# Patient Record
Sex: Female | Born: 1980
Health system: Southern US, Community
[De-identification: ages and names within clinical notes are randomized; demographics above are authoritative.]

## PROBLEM LIST (undated history)

## (undated) DIAGNOSIS — D649 Anemia, unspecified: Secondary | ICD-10-CM

## (undated) DIAGNOSIS — F419 Anxiety disorder, unspecified: Secondary | ICD-10-CM

## (undated) DIAGNOSIS — I10 Essential (primary) hypertension: Secondary | ICD-10-CM

## (undated) DIAGNOSIS — Z9889 Other specified postprocedural states: Secondary | ICD-10-CM

## (undated) DIAGNOSIS — R609 Edema, unspecified: Secondary | ICD-10-CM

## (undated) DIAGNOSIS — R519 Headache, unspecified: Secondary | ICD-10-CM

## (undated) DIAGNOSIS — R112 Nausea with vomiting, unspecified: Secondary | ICD-10-CM

## (undated) DIAGNOSIS — C801 Malignant (primary) neoplasm, unspecified: Secondary | ICD-10-CM

## (undated) HISTORY — PX: OTHER SURGICAL HISTORY: SHX169

## (undated) HISTORY — PX: WISDOM TOOTH EXTRACTION: SHX21

## (undated) HISTORY — PX: DILATION AND CURETTAGE OF UTERUS: SHX78

## (undated) HISTORY — DX: Edema, unspecified: R60.9

## (undated) HISTORY — PX: UPPER GASTROINTESTINAL ENDOSCOPY: SHX188

## (undated) HISTORY — DX: Anxiety disorder, unspecified: F41.9

---

## 2006-03-16 ENCOUNTER — Ambulatory Visit: Payer: Self-pay | Admitting: Unknown Physician Specialty

## 2007-01-18 ENCOUNTER — Ambulatory Visit: Payer: Self-pay

## 2007-03-15 ENCOUNTER — Observation Stay: Payer: Self-pay | Admitting: Unknown Physician Specialty

## 2007-03-15 ENCOUNTER — Other Ambulatory Visit: Payer: Self-pay

## 2007-04-06 ENCOUNTER — Ambulatory Visit: Payer: Self-pay | Admitting: Unknown Physician Specialty

## 2007-04-07 ENCOUNTER — Inpatient Hospital Stay: Payer: Self-pay | Admitting: Unknown Physician Specialty

## 2009-04-19 ENCOUNTER — Ambulatory Visit: Payer: Self-pay | Admitting: Internal Medicine

## 2012-03-10 ENCOUNTER — Ambulatory Visit: Payer: Self-pay | Admitting: Family Medicine

## 2012-10-15 ENCOUNTER — Ambulatory Visit: Payer: Self-pay

## 2012-10-15 LAB — RAPID INFLUENZA A&B ANTIGENS

## 2013-11-15 ENCOUNTER — Ambulatory Visit: Payer: Self-pay | Admitting: Specialist

## 2014-01-19 ENCOUNTER — Ambulatory Visit: Payer: Self-pay | Admitting: Physician Assistant

## 2014-02-24 HISTORY — PX: BARIATRIC SURGERY: SHX1103

## 2014-02-24 HISTORY — PX: CHOLECYSTECTOMY: SHX55

## 2014-03-05 ENCOUNTER — Ambulatory Visit: Payer: Self-pay | Admitting: Specialist

## 2014-03-13 ENCOUNTER — Inpatient Hospital Stay: Payer: Self-pay | Admitting: Specialist

## 2014-03-13 LAB — CREATININE, SERUM
Creatinine: 0.88 mg/dL (ref 0.60–1.30)
EGFR (Non-African Amer.): >60

## 2014-03-14 LAB — MAGNESIUM: Magnesium: 1.6 mg/dL — ABNORMAL LOW

## 2014-03-14 LAB — CBC WITH DIFFERENTIAL/PLATELET
BASOS ABS: 0 10*3/uL (ref 0.0–0.1)
BASOS PCT: 0.2 %
Eosinophil #: 0 10*3/uL (ref 0.0–0.7)
Eosinophil %: 0.1 %
HCT: 36.4 % (ref 35.0–47.0)
HGB: 12.9 g/dL (ref 12.0–16.0)
LYMPHS ABS: 1 10*3/uL (ref 1.0–3.6)
Lymphocyte %: 11.5 %
MCH: 33.9 pg (ref 26.0–34.0)
MCHC: 35.5 g/dL (ref 32.0–36.0)
MCV: 96 fL (ref 80–100)
MONO ABS: 0.5 x10 3/mm (ref 0.2–0.9)
MONOS PCT: 6.1 %
NEUTROS PCT: 82.1 %
Neutrophil #: 7.4 10*3/uL — ABNORMAL HIGH (ref 1.4–6.5)
Platelet: 152 10*3/uL (ref 150–440)
RBC: 3.81 10*6/uL (ref 3.80–5.20)
RDW: 12.9 % (ref 11.5–14.5)
WBC: 9 10*3/uL (ref 3.6–11.0)

## 2014-03-14 LAB — BASIC METABOLIC PANEL
Anion Gap: 10 (ref 7–16)
BUN: 10 mg/dL (ref 7–18)
CALCIUM: 8.3 mg/dL — AB (ref 8.5–10.1)
Chloride: 104 mmol/L (ref 98–107)
Co2: 24 mmol/L (ref 21–32)
Creatinine: 1.03 mg/dL (ref 0.60–1.30)
Glucose: 98 mg/dL (ref 65–99)
OSMOLALITY: 275 (ref 275–301)
Potassium: 3.9 mmol/L (ref 3.5–5.1)
Sodium: 138 mmol/L (ref 136–145)

## 2014-03-14 LAB — PHOSPHORUS: Phosphorus: 3 mg/dL (ref 2.5–4.9)

## 2014-03-14 LAB — ALBUMIN: ALBUMIN: 3.5 g/dL (ref 3.4–5.0)

## 2014-03-16 LAB — PATHOLOGY REPORT

## 2014-04-04 LAB — LIPID PANEL
CHOLESTEROL: 91 mg/dL (ref 0–200)
HDL: 34 mg/dL — AB (ref 35–70)
LDL Cholesterol: 38 mg/dL
Triglycerides: 93 mg/dL (ref 40–160)

## 2014-04-04 LAB — CBC AND DIFFERENTIAL
HEMATOCRIT: 39 % (ref 36–46)
Hemoglobin: 13.1 g/dL (ref 12.0–16.0)

## 2014-04-04 LAB — HM PAP SMEAR: HM Pap smear: NEGATIVE

## 2014-04-04 LAB — HEMOGLOBIN A1C: Hgb A1c MFr Bld: 5 % (ref 4.0–6.0)

## 2014-11-17 NOTE — Op Note (Signed)
PATIENT NAME:  Lauren [name], Lauren [name] MR#:  [id number] DATE OF BIRTH:  1980/09/01  DATE OF PROCEDURE:  03/13/2014  PREOPERATIVE DIAGNOSES:  1. Morbid obesity. 2. Gallstones.   POSTOPERATIVE DIAGNOSES: 1. Morbid obesity. 2. Gallstones.   PROCEDURE: Laparoscopic sleeve gastrectomy with cholecystectomy.   SURGEON: Kreg Shropshire, MD.   ASSISTANT: Valaria Good, PA.   COMPLICATIONS: None.   ANESTHESIA: General endotracheal.   CLINICAL HISTORY: See H and P.   ESTIMATED BLOOD LOSS: None.  DETAILS OF PROCEDURE:  The patient was taken to the operating room and placed on the operating room table, in the supine position, with appropriate monitors and supplemental oxygen being delivered.  Broad spectrum IV antibiotics were administered. The patient was placed under general anesthesia without incident.  The abdomen was prepped and draped in the usual sterile fashion.  Access was obtained using 5 mm Optical trocar. Pneumoperitoneum was established without difficulty. Multiple other ports were placed in preparation for sleeve gastrectomy. A liver retractor was placed without incident. The entire stomach was mobilized from 5 cm from the pylorus all the way up to the fundus, and the fundus was mobilized off the left crura as well completely freeing up the posterior portion of the stomach. Posterior attachments were taken down so the crura could be visualized from both sides.  At that point, everything was removed from the stomach and a 44 [place] Bougie was placed down into the antrum. An Echelon green load stapler was used to bisect the antrum on first fire starting approximately 5 to 6 cm from the pylorus. I then continued up along the Bougie using a blue load stapler with excellent affect with care not to get too close to the Bougie itself, with minimal traction. This continued all the way up to the left crura. The excess stomach was placed on the side and the Bougie was removed and endoscopy showed no evidence of  obstruction at that time.  The excess stomach was removed through the abdominal cavity, and the wounds were closed using 4-0 Vicryl and Dermabond.  ADDENDUM: Once the sleeve was completed and the excess portion of stomach removed, the 15 mm trocars were placed. The gallbladder was then addressed. The cystic duct and cystic artery were dissected free via safety clips placed proximally and distally on the duct and was divided sharply. Cystic artery was divided using a Harmonic scalpel. The gallbladder was removed out of the liver bed using a Harmonic scalpel. Specimen sent for pathologic evaluation. Right upper quadrant was reinspected and no bleeding or bile leak was present. Trocars removed without incident. The wounds were closed using 4-0 Vicryl and Dermabond. The patient tolerated the procedure well and arrived to recovery in stable condition.    ____________________________ Kreg Shropshire, MD jb:NT D: 03/13/2014 14:55:52 ET T: 03/13/2014 18:52:07 ET JOB#: [id number]  cc: Kreg Shropshire, MD, <Dictator> Bonner Puna MD ELECTRONICALLY SIGNED 03/14/2014 7:26

## 2014-12-07 ENCOUNTER — Encounter: Payer: Self-pay | Admitting: Internal Medicine

## 2014-12-07 DIAGNOSIS — I10 Essential (primary) hypertension: Secondary | ICD-10-CM | POA: Insufficient documentation

## 2014-12-07 DIAGNOSIS — G43909 Migraine, unspecified, not intractable, without status migrainosus: Secondary | ICD-10-CM | POA: Insufficient documentation

## 2014-12-07 DIAGNOSIS — O99213 Obesity complicating pregnancy, third trimester: Secondary | ICD-10-CM | POA: Insufficient documentation

## 2014-12-07 DIAGNOSIS — D485 Neoplasm of uncertain behavior of skin: Secondary | ICD-10-CM | POA: Insufficient documentation

## 2014-12-07 DIAGNOSIS — T7840XA Allergy, unspecified, initial encounter: Secondary | ICD-10-CM | POA: Insufficient documentation

## 2014-12-07 DIAGNOSIS — D510 Vitamin B12 deficiency anemia due to intrinsic factor deficiency: Secondary | ICD-10-CM | POA: Insufficient documentation

## 2014-12-07 DIAGNOSIS — E668 Other obesity: Secondary | ICD-10-CM | POA: Insufficient documentation

## 2014-12-07 DIAGNOSIS — F325 Major depressive disorder, single episode, in full remission: Secondary | ICD-10-CM | POA: Insufficient documentation

## 2014-12-07 HISTORY — DX: Obesity complicating pregnancy, third trimester: O99.213

## 2014-12-31 ENCOUNTER — Encounter: Payer: Self-pay | Admitting: Internal Medicine

## 2014-12-31 ENCOUNTER — Ambulatory Visit (INDEPENDENT_AMBULATORY_CARE_PROVIDER_SITE_OTHER): Payer: BLUE CROSS/BLUE SHIELD | Admitting: Internal Medicine

## 2014-12-31 VITALS — BP 122/90 | HR 68 | Ht 62.0 in | Wt 206.2 lb

## 2014-12-31 DIAGNOSIS — F419 Anxiety disorder, unspecified: Secondary | ICD-10-CM

## 2014-12-31 DIAGNOSIS — R600 Localized edema: Secondary | ICD-10-CM | POA: Diagnosis not present

## 2014-12-31 MED ORDER — BUPROPION HCL ER (XL) 150 MG PO TB24: 150.0000 mg | ORAL_TABLET | Freq: Every day | ORAL | Status: DC

## 2014-12-31 MED ORDER — HYDROCHLOROTHIAZIDE 12.5 MG PO TABS
12.5000 mg | ORAL_TABLET | Freq: Every day | ORAL | Status: DC
Start: 2014-12-31 — End: 2015-12-20

## 2014-12-31 NOTE — Progress Notes (Signed)
Date:  12/31/2014   Name:  Lauren [name]   DOB:  [birth year]   MRN:  [id number]   Chief Complaint: Depression and Edema   History of Present Illness:  This is a 34 y.o. female who is presenting with the above problems  Anxiety Presents for follow-up visit. Symptoms include irritability, muscle tension, nervous/anxious behavior and panic. Patient reports no confusion, depressed mood, insomnia or shortness of breath. Symptoms occur most days. The severity of symptoms is moderate. The symptoms are aggravated by social activities and work stress. The quality of sleep is fair.   Past treatments include SSRIs. Compliance with prior treatments has been good (took zoloft (zombie) and prozac (wt gain)).   Edema/HTN - has been on HCTZ 12.5 mg in the past.  Ran out 2 weeks ago and now has more ankle edema.  She has not been following a low sodium diet.  BP has been around 120/70.  In the past she has had hypertension but it improved significantly after weight loss from gastric bypass.  Review of Systems:  Review of Systems  Constitutional: Positive for irritability. Negative for fever, appetite change and fatigue.  Respiratory: Negative for chest tightness and shortness of breath.   Cardiovascular: Positive for leg swelling.  Gastrointestinal: Negative for abdominal distention.  Musculoskeletal: Negative for back pain and joint swelling.  Psychiatric/Behavioral: Positive for sleep disturbance. Negative for confusion, dysphoric mood and agitation. The patient is nervous/anxious. The patient does not have insomnia.     Patient Active Problem List   Diagnosis Date Noted  . Allergic state 12/07/2014  . Essential (primary) hypertension 12/07/2014  . Major depressive disorder, single episode in full remission 12/07/2014  . Headache, migraine 12/07/2014  . Extreme obesity 12/07/2014  . Neoplasm of uncertain behavior of skin 12/07/2014  . Addison anemia 12/07/2014    Prior to Admission  medications   Medication Sig Start Date End Date Taking? Authorizing Provider  levonorgestrel (MIRENA) 20 MCG/24HR IUD by Intrauterine route.   Yes Historical Provider, MD  nystatin cream (MYCOSTATIN) Apply 1 application topically daily as needed. 11/15/13   Historical Provider, MD    Allergies  Allergen Reactions  . Zoloft  [Sertraline Hcl]     apathy.    Past Surgical History  Procedure Laterality Date  . Cholecystectomy  02/2014  . Bariatric surgery  02/2014    Gastric Sleeve    History  Substance Use Topics  . Smoking status: Never Smoker   . Smokeless tobacco: Not on file  . Alcohol Use: No     Medication list has been reviewed and updated.  Physical Examination:  Physical Exam  Constitutional: She is oriented to person, place, and time. She appears well-developed. No distress.  Eyes: Conjunctivae are normal.  Neck: No thyromegaly present.  Cardiovascular: Normal rate, regular rhythm and normal heart sounds.   Pulmonary/Chest: Effort normal and breath sounds normal. No respiratory distress. She has no wheezes.  Abdominal: She exhibits no distension.  Musculoskeletal: Normal range of motion. She exhibits edema (trace ankle edema bilaterally).  Lymphadenopathy:    She has no cervical adenopathy.  Neurological: She is alert and oriented to person, place, and time.  Skin: Skin is warm and dry. No rash noted.  Psychiatric: She has a normal mood and affect. Her behavior is normal. Thought content normal.    BP 122/90 mmHg  Pulse 68  Ht 5\' 2"  (1.575 m)  Wt 206 lb 3.2 oz (93.532 kg)  BMI 37.71 kg/m2  Assessment and Plan:  1. Acute anxiety Will begin bupropion XL 150 mg Pt can call for higher dose after one month if needed  2. Bilateral edema of lower extremity Resume HCTZ Continue adequate water intake and limit sodium   Halina Maidens, MD [facility]  12/31/2014

## 2015-04-26 ENCOUNTER — Ambulatory Visit (INDEPENDENT_AMBULATORY_CARE_PROVIDER_SITE_OTHER): Payer: 59 | Admitting: Internal Medicine

## 2015-04-26 ENCOUNTER — Encounter: Payer: Self-pay | Admitting: Internal Medicine

## 2015-04-26 VITALS — BP 104/60 | HR 72 | Ht 62.0 in | Wt 190.2 lb

## 2015-04-26 DIAGNOSIS — Z0000 Encounter for general adult medical examination without abnormal findings: Secondary | ICD-10-CM | POA: Diagnosis not present

## 2015-04-26 DIAGNOSIS — Z9884 Bariatric surgery status: Secondary | ICD-10-CM

## 2015-04-26 DIAGNOSIS — R600 Localized edema: Secondary | ICD-10-CM

## 2015-04-26 DIAGNOSIS — I10 Essential (primary) hypertension: Secondary | ICD-10-CM

## 2015-04-26 DIAGNOSIS — R609 Edema, unspecified: Secondary | ICD-10-CM

## 2015-04-26 DIAGNOSIS — F325 Major depressive disorder, single episode, in full remission: Secondary | ICD-10-CM | POA: Diagnosis not present

## 2015-04-26 DIAGNOSIS — G43009 Migraine without aura, not intractable, without status migrainosus: Secondary | ICD-10-CM

## 2015-04-26 LAB — POCT URINALYSIS DIPSTICK
BILIRUBIN UA: NEGATIVE
Blood, UA: NEGATIVE
Glucose, UA: NEGATIVE
Ketones, UA: NEGATIVE
Leukocytes, UA: NEGATIVE
Nitrite, UA: NEGATIVE
Protein, UA: NEGATIVE
Spec Grav, UA: 1.015
UROBILINOGEN UA: 0.2
pH, UA: 5

## 2015-04-26 MED ORDER — BUPROPION HCL ER (XL) 300 MG PO TB24: 300.0000 mg | ORAL_TABLET | Freq: Every day | ORAL | Status: DC

## 2015-04-26 NOTE — Progress Notes (Signed)
Date:  04/26/2015   Name:  Lauren [name]   DOB:  1980/07/31   MRN:  [id number]   Chief Complaint: Annual Exam and Depression [name] is a 34 y.o. female who presents today for her Complete Annual Exam. She feels fairly well. She reports exercising regularly. She reports she is sleeping fairly well.  She is doing very well status post her gastric sleeve. She's now lost about 110 pounds. She's lost 15 pounds in the past month with ongoing slow steady weight loss. She is exercising but needs to start exercising a bit more. She has no problems other than occasional dumping syndrome when she eats incorrectly. She has never had a mammogram. She has occasional discomfort in the left breast not associated with any activity or injury. She does not perform a self exam. Depression        This is a chronic problem.  The onset quality is gradual.   The problem has been gradually improving since onset.  Associated symptoms include headaches.  Associated symptoms include no decreased concentration and no fatigue.     The symptoms are aggravated by family issues and work stress.  Treatments tried: Welbutrin.  Compliance with treatment is good.  Previous treatment provided moderate (She would like to try a higher dose of bupropion) relief. Hypertension This is a chronic problem. The current episode started more than 1 year ago. The problem has been gradually improving since onset. The problem is controlled. Associated symptoms include headaches. Pertinent negatives include no chest pain, palpitations or shortness of breath. There are no associated agents to hypertension. There are no known risk factors for coronary artery disease. Past treatments include diuretics.   Migraine headache - patient has a history of ongoing migraine headaches. They're not changing in quality or frequency or intensity. However since her bariatric surgery she can only take Tylenol. This seems to help to a fair degree. She  usually does better with ibuprofen but is avoiding that. We discussed drinking a caffeinated beverage along with the Tylenol for better efficacy  Review of Systems:  Review of Systems  Constitutional: Negative for fever, diaphoresis, fatigue and unexpected weight change.  HENT: Negative for hearing loss, tinnitus and trouble swallowing.   Eyes: Negative for visual disturbance.  Respiratory: Negative for shortness of breath and wheezing.   Cardiovascular: Negative for chest pain, palpitations and leg swelling.  Gastrointestinal: Negative for abdominal pain, diarrhea, constipation and blood in stool.  Genitourinary: Negative for dysuria, menstrual problem and pelvic pain.  Musculoskeletal: Negative for back pain and gait problem.  Neurological: Positive for headaches. Negative for syncope and light-headedness.  Psychiatric/Behavioral: Positive for depression and dysphoric mood. Negative for decreased concentration. The patient is not nervous/anxious.     Patient Active Problem List   Diagnosis Date Noted  . Allergic state 12/07/2014  . Essential (primary) hypertension 12/07/2014  . Major depressive disorder, single episode in full remission 12/07/2014  . Headache, migraine 12/07/2014  . Extreme obesity 12/07/2014  . Neoplasm of uncertain behavior of skin 12/07/2014  . Addison anemia 12/07/2014    Prior to Admission medications   Medication Sig Start Date End Date Taking? Authorizing Camry Theiss  buPROPion (WELLBUTRIN XL) 150 MG 24 hr tablet Take 1 tablet (150 mg total) by mouth daily. 12/31/14  Yes Glean Hess, MD  hydrochlorothiazide (HYDRODIURIL) 12.5 MG tablet Take 1 tablet (12.5 mg total) by mouth daily. 12/31/14  Yes Glean Hess, MD  levonorgestrel (MIRENA) 20 MCG/24HR IUD by Intrauterine route.  Yes Historical Dorthula Bier, MD    No Known Allergies  Past Surgical History  Procedure Laterality Date  . Cholecystectomy  02/2014  . Bariatric surgery  02/2014    Gastric Sleeve     Social History  Substance Use Topics  . Smoking status: Never Smoker   . Smokeless tobacco: None  . Alcohol Use: No     Medication list has been reviewed and updated.  Physical Examination:  Physical Exam  Constitutional: She is oriented to person, place, and time. She appears well-developed. No distress.  HENT:  Head: Normocephalic and atraumatic.  Right Ear: Tympanic membrane and ear canal normal.  Left Ear: Tympanic membrane and ear canal normal.  Nose: Right sinus exhibits no maxillary sinus tenderness. Left sinus exhibits no maxillary sinus tenderness.  Mouth/Throat: Oropharynx is clear and moist.  Eyes: Conjunctivae are normal. Pupils are equal, round, and reactive to light. Right eye exhibits no discharge. Left eye exhibits no discharge. No scleral icterus.  Neck: Normal range of motion. Neck supple. Carotid bruit is not present. No thyromegaly present.  Cardiovascular: Normal rate, regular rhythm and normal heart sounds.   Pulmonary/Chest: Effort normal and breath sounds normal. No respiratory distress. She has no wheezes. She has no rales. Right breast exhibits no mass, no nipple discharge, no skin change and no tenderness. Left breast exhibits no mass, no nipple discharge, no skin change and no tenderness.  Abdominal: Soft. Bowel sounds are normal. She exhibits no mass. There is no hepatosplenomegaly. There is no tenderness. There is no rebound, no guarding and no CVA tenderness.  Musculoskeletal: Normal range of motion. She exhibits no edema or tenderness.       Right knee: Normal.       Left knee: Normal.  Lymphadenopathy:    She has no cervical adenopathy.  Neurological: She is alert and oriented to person, place, and time. She has normal reflexes.  Skin: Skin is warm, dry and intact. No rash noted.  Psychiatric: She has a normal mood and affect. Her speech is normal and behavior is normal. Thought content normal. Cognition and memory are normal.  Nursing note and  vitals reviewed.   BP 104/60 mmHg  Pulse 72  Ht 5\' 2"  (1.575 m)  Wt 190 lb 3.2 oz (86.274 kg)  BMI 34.78 kg/m2  Assessment and Plan: 1. Annual physical exam Begin regular self exams Baseline mammogram next year - POCT urinalysis dipstick - Lipid panel  2. Essential (primary) hypertension Controlled on diuretic alone - CBC with Differential/Platelet - Comprehensive metabolic panel  3. Migraine without aura and without status migrainosus, not intractable Continue Tylenol as needed May also benefit from some caffeine as well  4. Major depressive disorder, single episode in full remission Much improved; will increase bupropion to 300 mg daily - TSH - buPROPion (WELLBUTRIN XL) 300 MG 24 hr tablet; Take 1 tablet (300 mg total) by mouth daily.  Dispense: 30 tablet; Refill: 5  5. Edema extremities Controlled  6. Hx of bariatric surgery Doing well with excellent weight loss Additional labs have been ordered by the bariatric surgeon - Hemoglobin A1c   Halina Maidens, MD [facility]  04/26/2015

## 2015-04-26 NOTE — Patient Instructions (Signed)

## 2015-04-27 LAB — COMPREHENSIVE METABOLIC PANEL
ALBUMIN: 4.4 g/dL (ref 3.5–5.5)
ALT: 11 IU/L (ref 0–32)
AST: 15 IU/L (ref 0–40)
Albumin/Globulin Ratio: 1.7 (ref 1.1–2.5)
Alkaline Phosphatase: 84 IU/L (ref 39–117)
BILIRUBIN TOTAL: 1 mg/dL (ref 0.0–1.2)
BUN / CREAT RATIO: 19 (ref 8–20)
BUN: 14 mg/dL (ref 6–20)
CO2: 25 mmol/L (ref 18–29)
CREATININE: 0.72 mg/dL (ref 0.57–1.00)
Calcium: 9.6 mg/dL (ref 8.7–10.2)
Chloride: 98 mmol/L (ref 97–108)
GFR, EST AFRICAN AMERICAN: 126 mL/min/{1.73_m2} (ref >59)
GFR, EST NON AFRICAN AMERICAN: 110 mL/min/{1.73_m2} (ref >59)
Globulin, Total: 2.6 g/dL (ref 1.5–4.5)
Glucose: 85 mg/dL (ref 65–99)
Potassium: 3.6 mmol/L (ref 3.5–5.2)
Sodium: 139 mmol/L (ref 134–144)
Total Protein: 7 g/dL (ref 6.0–8.5)

## 2015-04-27 LAB — CBC WITH DIFFERENTIAL/PLATELET
Basophils Absolute: 0 10*3/uL (ref 0.0–0.2)
Basos: 0 %
EOS (ABSOLUTE): 0.1 10*3/uL (ref 0.0–0.4)
Eos: 1 %
HEMOGLOBIN: 14.6 g/dL (ref 11.1–15.9)
Hematocrit: 41.8 % (ref 34.0–46.6)
IMMATURE GRANS (ABS): 0 10*3/uL (ref 0.0–0.1)
Immature Granulocytes: 0 %
LYMPHS: 38 %
Lymphocytes Absolute: 2 10*3/uL (ref 0.7–3.1)
MCH: 32.8 pg (ref 26.6–33.0)
MCHC: 34.9 g/dL (ref 31.5–35.7)
MCV: 94 fL (ref 79–97)
Monocytes Absolute: 0.3 10*3/uL (ref 0.1–0.9)
Monocytes: 6 %
NEUTROS ABS: 2.8 10*3/uL (ref 1.4–7.0)
NEUTROS PCT: 55 %
Platelets: 205 10*3/uL (ref 150–379)
RBC: 4.45 x10E6/uL (ref 3.77–5.28)
RDW: 12.8 % (ref 12.3–15.4)
WBC: 5.1 10*3/uL (ref 3.4–10.8)

## 2015-04-27 LAB — TSH: TSH: 0.737 u[IU]/mL (ref 0.450–4.500)

## 2015-04-27 LAB — LIPID PANEL
Chol/HDL Ratio: 2.5 ratio units (ref 0.0–4.4)
Cholesterol, Total: 145 mg/dL (ref 100–199)
HDL: 59 mg/dL (ref >39)
LDL CALC: 74 mg/dL (ref 0–99)
Triglycerides: 59 mg/dL (ref 0–149)
VLDL CHOLESTEROL CAL: 12 mg/dL (ref 5–40)

## 2015-04-27 LAB — HEMOGLOBIN A1C
ESTIMATED AVERAGE GLUCOSE: 100 mg/dL
Hgb A1c MFr Bld: 5.1 % (ref 4.8–5.6)

## 2015-09-10 ENCOUNTER — Ambulatory Visit (INDEPENDENT_AMBULATORY_CARE_PROVIDER_SITE_OTHER): Payer: 59 | Admitting: Internal Medicine

## 2015-09-10 ENCOUNTER — Encounter: Payer: Self-pay | Admitting: Internal Medicine

## 2015-09-10 VITALS — BP 104/78 | HR 72 | Temp 97.9°F | Ht 62.0 in | Wt 190.0 lb

## 2015-09-10 DIAGNOSIS — R110 Nausea: Secondary | ICD-10-CM | POA: Insufficient documentation

## 2015-09-10 MED ORDER — RANITIDINE HCL 150 MG PO TABS: 150.0000 mg | ORAL_TABLET | Freq: Every day | ORAL | Status: DC

## 2015-09-10 NOTE — Progress Notes (Signed)
    Date:  09/10/2015   Name:  Lauren [name]   DOB:  Aug 16, 1980   MRN:  [id number]   Chief Complaint: Nausea HPI Patient noted onset of nausea several weeks ago. The nausea was intermittent and mild. It seemed to be worse if she over ate or ate the wrong types of foods. Over the last week however has become more progressive and more continuous. She is on no new medications or supplements. She's had no vomiting diarrhea or constipation. No fever or chills. She is status post a gastric sleeve. She has a Mirena IUD. Home pregnancy test was negative.   Review of Systems  Constitutional: Negative for chills, diaphoresis, fatigue and unexpected weight change.  Respiratory: Negative for cough, chest tightness and shortness of breath.   Cardiovascular: Positive for palpitations. Negative for chest pain and leg swelling.  Gastrointestinal: Positive for nausea and abdominal distention. Negative for vomiting, diarrhea and constipation.  Endocrine: Negative for polydipsia and polyuria.    Patient Active Problem List   Diagnosis Date Noted  . Allergic state 12/07/2014  . Essential (primary) hypertension 12/07/2014  . Major depressive disorder, single episode in full remission ([place]) 12/07/2014  . Headache, migraine 12/07/2014  . Neoplasm of uncertain behavior of skin 12/07/2014  . Addison anemia 12/07/2014    Prior to Admission medications   Medication Sig Start Date End Date Taking? Authorizing Provider  buPROPion (WELLBUTRIN XL) 300 MG 24 hr tablet Take 1 tablet (300 mg total) by mouth daily. 04/26/15  Yes Glean Hess, MD  hydrochlorothiazide (HYDRODIURIL) 12.5 MG tablet Take 1 tablet (12.5 mg total) by mouth daily. 12/31/14  Yes Glean Hess, MD  levonorgestrel (MIRENA) 20 MCG/24HR IUD by Intrauterine route.   Yes Historical Provider, MD    No Known Allergies  Past Surgical History  Procedure Laterality Date  . Cholecystectomy  02/2014  . Bariatric surgery  02/2014    Gastric  Sleeve    Social History  Substance Use Topics  . Smoking status: Never Smoker   . Smokeless tobacco: None  . Alcohol Use: No     Medication list has been reviewed and updated.   Physical Exam  Constitutional: She is oriented to person, place, and time. She appears well-developed. No distress.  HENT:  Head: Normocephalic and atraumatic.  Cardiovascular: Normal rate, regular rhythm and normal heart sounds.   Pulmonary/Chest: Effort normal and breath sounds normal. No respiratory distress.  Abdominal: Soft. Bowel sounds are normal. She exhibits no distension and no mass. There is no tenderness. There is no rebound and no guarding.  Musculoskeletal: Normal range of motion. She exhibits no edema.  Neurological: She is alert and oriented to person, place, and time.  Skin: Skin is warm and dry. No rash noted.  Psychiatric: She has a normal mood and affect. Her behavior is normal. Thought content normal.    BP 104/78 mmHg  Pulse 72  Temp(Src) 97.9 F (36.6 C) (Oral)  Ht 5\' 2"  (1.575 m)  Wt 190 lb (86.183 kg)  BMI 34.74 kg/m2  LMP 08/28/2015  Assessment and Plan: 1. Nausea Suspect acid related  Will rule H Pylori  - ranitidine (ZANTAC) 150 MG tablet; Take 1 tablet (150 mg total) by mouth at bedtime.  Dispense: 30 tablet; Refill: 5 - H. pylori antibody, IgG   Halina Maidens, MD [facility]  09/10/2015

## 2015-09-11 ENCOUNTER — Telehealth: Payer: Self-pay

## 2015-09-11 LAB — H. PYLORI ANTIBODY, IGG

## 2015-09-11 NOTE — Telephone Encounter (Signed)
-----   Message from Glean Hess, MD sent at 09/11/2015  2:46 PM EST ----- H Pylori is negative.  Take ranitidine as directed and call for referral to GI if not improving in 1-2 weeks.

## 2015-09-11 NOTE — Telephone Encounter (Signed)
Spoke with patient. Patient advised of all results and verbalized understanding. Will call back with any future questions or concerns. MAH  

## 2015-09-16 ENCOUNTER — Encounter: Payer: Self-pay | Admitting: Internal Medicine

## 2015-09-24 ENCOUNTER — Encounter: Payer: 59 | Admitting: Obstetrics and Gynecology

## 2015-09-27 ENCOUNTER — Other Ambulatory Visit: Payer: Self-pay | Admitting: Internal Medicine

## 2015-09-27 ENCOUNTER — Telehealth: Payer: Self-pay

## 2015-09-27 MED ORDER — OSELTAMIVIR PHOSPHATE 75 MG PO CAPS: 75.0000 mg | ORAL_CAPSULE | Freq: Two times a day (BID) | ORAL | Status: DC

## 2015-09-27 NOTE — Telephone Encounter (Signed)
Patient called in today and states that daughter was positive for the flu last night and wanted to know if you could send in Tamiflu, she is starting to present with symptoms.

## 2015-10-04 ENCOUNTER — Ambulatory Visit (INDEPENDENT_AMBULATORY_CARE_PROVIDER_SITE_OTHER): Payer: 59 | Admitting: Obstetrics and Gynecology

## 2015-10-04 ENCOUNTER — Encounter: Payer: Self-pay | Admitting: Obstetrics and Gynecology

## 2015-10-04 VITALS — BP 110/78 | HR 77 | Ht 62.0 in | Wt 189.3 lb

## 2015-10-04 DIAGNOSIS — Z30433 Encounter for removal and reinsertion of intrauterine contraceptive device: Secondary | ICD-10-CM

## 2015-10-04 NOTE — Progress Notes (Signed)
Lauren [name] is a 35 y.o. year old G10P0 Caucasian female who presents for removal and replacement of a Mirena IUD. She was given informed consent for removal and reinsertion of her Mirena. Her Mirena was placed 2013, Patient's last menstrual period was 09/24/2015., and her pregnancy test today was negative.   The risks and benefits of the method and placement have been thouroughly reviewed with the patient and all questions were answered.  Specifically the patient is aware of failure rate of 07/998, expulsion of the IUD and of possible perforation.  The patient is aware of irregular bleeding due to the method and understands the incidence of irregular bleeding diminishes with time.  Signed copy of informed consent in chart.   Patient's last menstrual period was 09/24/2015. BP 110/78 mmHg  Pulse 77  Ht 5\' 2"  (1.575 m)  Wt 189 lb 4.8 oz (85.866 kg)  BMI 34.61 kg/m2  LMP 09/24/2015 No results found for this or any previous visit (from the past 24 hour(s)).   Appropriate time out taken. A graves speculum was placed in the vagina.  The cervix was visualized, prepped using Betadine. The strings were visible. They were grasped and the Mirena was easily removed. The cervix was then grasped with a single-tooth tenaculum. The uterus was found to be retroflexed and it sounded to 9 cm.  Mirena IUD placed per manufacturer's recommendations without complications. The strings were trimmed to 3 cm.  The patient tolerated the procedure well.   The patient was given post procedure instructions, including signs and symptoms of infection and to check for the strings after each menses or each month, and refraining from intercourse or anything in the vagina for 3 days.  She was given a Mirena care card with date Mirena placed, and date Mirena to be removed.    Keone Kamer Rockney Ghee, CNM

## 2015-10-26 HISTORY — PX: BASAL CELL CARCINOMA EXCISION: SHX1214

## 2015-12-20 ENCOUNTER — Other Ambulatory Visit: Payer: Self-pay | Admitting: Internal Medicine

## 2015-12-25 NOTE — Telephone Encounter (Signed)
pts coming in on 10/6 for cpe

## 2016-02-14 ENCOUNTER — Ambulatory Visit (INDEPENDENT_AMBULATORY_CARE_PROVIDER_SITE_OTHER): Payer: 59 | Admitting: Internal Medicine

## 2016-02-14 ENCOUNTER — Encounter: Payer: Self-pay | Admitting: Internal Medicine

## 2016-02-14 VITALS — BP 126/72 | HR 72 | Ht 62.0 in | Wt 193.4 lb

## 2016-02-14 DIAGNOSIS — Q649 Congenital malformation of urinary system, unspecified: Secondary | ICD-10-CM

## 2016-02-14 DIAGNOSIS — T148XXA Other injury of unspecified body region, initial encounter: Secondary | ICD-10-CM

## 2016-02-14 LAB — POCT URINALYSIS DIPSTICK
BILIRUBIN UA: NEGATIVE
Blood, UA: NEGATIVE
GLUCOSE UA: NEGATIVE
Ketones, UA: NEGATIVE
LEUKOCYTES UA: NEGATIVE
NITRITE UA: NEGATIVE
PH UA: 5
Protein, UA: NEGATIVE
Spec Grav, UA: <=1.005
Urobilinogen, UA: 0.2

## 2016-02-14 NOTE — Progress Notes (Signed)
Date:  02/14/2016   Name:  Lauren [name]   DOB:  1981-04-05   MRN:  [id number]   Chief Complaint: Bleeding/Bruising HPI Bruising - noticed bruises on forearms and thighs.  Does not know how they were caused.  They are small and resolving quickly.  She had her Hgb checked yesterday - 13.5.  She does not take any aspirin, blood thinners or nsaids.  Urine problem - she has noticed a sweet smell to urine. No bleeding or burning.  No frequency or weight loss.   Review of Systems  Constitutional: Negative for fever and fatigue.  Respiratory: Negative for chest tightness and shortness of breath.   Cardiovascular: Negative for chest pain and leg swelling.  Gastrointestinal: Negative for blood in stool.  Genitourinary: Negative for frequency and hematuria.       Urine has sweet smell  Musculoskeletal: Negative for joint swelling, arthralgias and gait problem.  Skin: Positive for color change (bruises on forearms and left thigh).  Neurological: Negative for dizziness.    Patient Active Problem List   Diagnosis Date Noted  . Nausea 09/10/2015  . Allergic state 12/07/2014  . Essential (primary) hypertension 12/07/2014  . Major depressive disorder, single episode in full remission ([place]) 12/07/2014  . Headache, migraine 12/07/2014  . Neoplasm of uncertain behavior of skin 12/07/2014  . Addison anemia 12/07/2014    Prior to Admission medications   Medication Sig Start Date End Date Taking? Authorizing Provider  buPROPion (WELLBUTRIN XL) 300 MG 24 hr tablet TAKE 1 TABLET(300 MG) BY MOUTH DAILY 12/22/15  Yes Glean Hess, MD  hydrochlorothiazide (HYDRODIURIL) 12.5 MG tablet TAKE 1 TABLET BY MOUTH EVERY DAY 12/22/15  Yes Glean Hess, MD  levonorgestrel [facility]) 20 MCG/24HR IUD by Intrauterine route.   Yes Historical Provider, MD    No Known Allergies  Past Surgical History  Procedure Laterality Date  . Cholecystectomy  02/2014  . Bariatric surgery  02/2014    Gastric  Sleeve    Social History  Substance Use Topics  . Smoking status: Never Smoker   . Smokeless tobacco: None  . Alcohol Use: No     Medication list has been reviewed and updated.   Physical Exam  Constitutional: She is oriented to person, place, and time. She appears well-developed. No distress.  HENT:  Head: Normocephalic and atraumatic.  Neck: Normal range of motion. Neck supple.  Cardiovascular: Normal rate, regular rhythm and normal heart sounds.   Pulmonary/Chest: Effort normal and breath sounds normal. No respiratory distress. She has no wheezes.  Abdominal: There is no tenderness.  Musculoskeletal: Normal range of motion.  Lymphadenopathy:    She has no cervical adenopathy.    She has no axillary adenopathy.       Right: No inguinal adenopathy present.       Left: No inguinal adenopathy present.  Neurological: She is alert and oriented to person, place, and time.  Skin: Skin is warm and dry. No rash noted.     Psychiatric: She has a normal mood and affect. Her behavior is normal. Thought content normal.  Nursing note and vitals reviewed.   BP 126/72 mmHg  Pulse 72  Ht 5\' 2"  (1.575 m)  Wt 193 lb 6.4 oz (87.726 kg)  BMI 35.36 kg/m2  Assessment and Plan: 1. Bruising Patient is reassured but will check platelets - CBC with Differential/Platelet  2. Urinary anomaly Normal UA - reassured - POCT urinalysis dipstick   Halina Maidens, MD [facility]  [facility]  02/14/2016

## 2016-02-15 LAB — CBC WITH DIFFERENTIAL/PLATELET
BASOS ABS: 0 10*3/uL (ref 0.0–0.2)
Basos: 1 %
EOS (ABSOLUTE): 0.1 10*3/uL (ref 0.0–0.4)
Eos: 2 %
HEMOGLOBIN: 13.7 g/dL (ref 11.1–15.9)
Hematocrit: 39.8 % (ref 34.0–46.6)
IMMATURE GRANS (ABS): 0 10*3/uL (ref 0.0–0.1)
Immature Granulocytes: 0 %
LYMPHS: 35 %
Lymphocytes Absolute: 1.9 10*3/uL (ref 0.7–3.1)
MCH: 32.6 pg (ref 26.6–33.0)
MCHC: 34.4 g/dL (ref 31.5–35.7)
MCV: 95 fL (ref 79–97)
MONOCYTES: 6 %
Monocytes Absolute: 0.3 10*3/uL (ref 0.1–0.9)
NEUTROS PCT: 56 %
Neutrophils Absolute: 3.1 10*3/uL (ref 1.4–7.0)
PLATELETS: 188 10*3/uL (ref 150–379)
RBC: 4.2 x10E6/uL (ref 3.77–5.28)
RDW: 12.3 % (ref 12.3–15.4)
WBC: 5.5 10*3/uL (ref 3.4–10.8)

## 2016-05-01 ENCOUNTER — Encounter: Payer: Self-pay | Admitting: Internal Medicine

## 2016-05-01 ENCOUNTER — Ambulatory Visit (INDEPENDENT_AMBULATORY_CARE_PROVIDER_SITE_OTHER): Payer: 59 | Admitting: Internal Medicine

## 2016-05-01 VITALS — BP 120/92 | HR 78 | Ht 62.0 in | Wt 194.0 lb

## 2016-05-01 DIAGNOSIS — F325 Major depressive disorder, single episode, in full remission: Secondary | ICD-10-CM | POA: Diagnosis not present

## 2016-05-01 DIAGNOSIS — Z1231 Encounter for screening mammogram for malignant neoplasm of breast: Secondary | ICD-10-CM | POA: Diagnosis not present

## 2016-05-01 DIAGNOSIS — Z1239 Encounter for other screening for malignant neoplasm of breast: Secondary | ICD-10-CM

## 2016-05-01 DIAGNOSIS — J3089 Other allergic rhinitis: Secondary | ICD-10-CM | POA: Insufficient documentation

## 2016-05-01 DIAGNOSIS — I10 Essential (primary) hypertension: Secondary | ICD-10-CM

## 2016-05-01 DIAGNOSIS — Z0000 Encounter for general adult medical examination without abnormal findings: Secondary | ICD-10-CM | POA: Diagnosis not present

## 2016-05-01 LAB — POCT URINALYSIS DIPSTICK
Bilirubin, UA: NEGATIVE
Glucose, UA: NEGATIVE
Ketones, UA: NEGATIVE
Leukocytes, UA: NEGATIVE
Nitrite, UA: NEGATIVE
PROTEIN UA: NEGATIVE
RBC UA: NEGATIVE
SPEC GRAV UA: 1.015
UROBILINOGEN UA: 0.2
pH, UA: 7

## 2016-05-01 MED ORDER — BUPROPION HCL ER (XL) 300 MG PO TB24: 300.0000 mg | ORAL_TABLET | Freq: Every day | ORAL | 12 refills | Status: DC

## 2016-05-01 MED ORDER — HYDROCHLOROTHIAZIDE 12.5 MG PO TABS: 12.5000 mg | ORAL_TABLET | Freq: Every day | ORAL | 3 refills | Status: DC

## 2016-05-01 NOTE — Progress Notes (Signed)
Date:  05/01/2016   Name:  Lauren [name]   DOB:  01/16/1981   MRN:  [id number]   Chief Complaint: Annual Exam (no pap) Lauren [name] is a 35 y.o. female who presents today for her Complete Annual Exam. She feels well. She reports exercising walking. She reports she is sleeping well. She has no breast issues and has never had a mammogram.  Hypertension  This is a chronic problem. The current episode started more than 1 year ago. The problem is unchanged. The problem is controlled. Pertinent negatives include no chest pain, headaches, palpitations or shortness of breath.  Depression         This is a chronic problem.  The problem occurs rarely.  The problem has been resolved since onset.  Associated symptoms include no decreased concentration, no fatigue and no headaches.    Review of Systems  Constitutional: Negative for diaphoresis, fatigue, fever and unexpected weight change.  HENT: Positive for postnasal drip, sinus pressure and sneezing. Negative for hearing loss, tinnitus and trouble swallowing.   Eyes: Negative for visual disturbance.  Respiratory: Negative for shortness of breath and wheezing.   Cardiovascular: Negative for chest pain, palpitations and leg swelling.  Gastrointestinal: Negative for abdominal pain, blood in stool, constipation and diarrhea.  Genitourinary: Negative for dysuria, menstrual problem and pelvic pain.  Musculoskeletal: Negative for back pain and gait problem.  Skin: Negative for rash and wound.  Neurological: Negative for dizziness, syncope, light-headedness and headaches.  Hematological: Negative for adenopathy.  Psychiatric/Behavioral: Positive for depression. Negative for decreased concentration and dysphoric mood. The patient is not nervous/anxious.     Patient Active Problem List   Diagnosis Date Noted  . Nausea 09/10/2015  . Allergic state 12/07/2014  . Essential (primary) hypertension 12/07/2014  . Major depressive disorder,  single episode in full remission ([place]) 12/07/2014  . Headache, migraine 12/07/2014  . Neoplasm of uncertain behavior of skin 12/07/2014  . Addison anemia 12/07/2014    Prior to Admission medications   Medication Sig Start Date End Date Taking? Authorizing Provider  buPROPion (WELLBUTRIN XL) 300 MG 24 hr tablet TAKE 1 TABLET(300 MG) BY MOUTH DAILY 12/22/15  Yes Glean Hess, MD  hydrochlorothiazide (HYDRODIURIL) 12.5 MG tablet TAKE 1 TABLET BY MOUTH EVERY DAY 12/22/15  Yes Glean Hess, MD  levonorgestrel [facility]) 20 MCG/24HR IUD by Intrauterine route.   Yes Historical Provider, MD    No Known Allergies  Past Surgical History:  Procedure Laterality Date  . BARIATRIC SURGERY  02/2014   Gastric Sleeve  . BASAL CELL CARCINOMA EXCISION  10/2015  . CHOLECYSTECTOMY  02/2014    Social History  Substance Use Topics  . Smoking status: Never Smoker  . Smokeless tobacco: Never Used  . Alcohol use No     Medication list has been reviewed and updated.   Physical Exam  Constitutional: She is oriented to person, place, and time. She appears well-developed and well-nourished. No distress.  HENT:  Head: Normocephalic and atraumatic.  Right Ear: Tympanic membrane and ear canal normal.  Left Ear: Tympanic membrane and ear canal normal.  Nose: Right sinus exhibits no maxillary sinus tenderness. Left sinus exhibits no maxillary sinus tenderness.  Mouth/Throat: Uvula is midline and oropharynx is clear and moist.  Eyes: Conjunctivae and EOM are normal. Right eye exhibits no discharge. Left eye exhibits no discharge. No scleral icterus.  Neck: Normal range of motion. Carotid bruit is not present. No erythema present. No thyromegaly present.  Cardiovascular:  Normal rate, regular rhythm, normal heart sounds and normal pulses.   Pulmonary/Chest: Effort normal. No respiratory distress. She has no wheezes. Right breast exhibits no mass, no nipple discharge, no skin change and no tenderness. Left  breast exhibits no mass, no nipple discharge, no skin change and no tenderness.  Abdominal: Soft. Bowel sounds are normal. There is no hepatosplenomegaly. There is no tenderness. There is no CVA tenderness.  Musculoskeletal: Normal range of motion.  Lymphadenopathy:    She has no cervical adenopathy.    She has no axillary adenopathy.  Neurological: She is alert and oriented to person, place, and time. She has normal reflexes. No cranial nerve deficit or sensory deficit.  Skin: Skin is warm, dry and intact. No rash noted.  Psychiatric: She has a normal mood and affect. Her speech is normal and behavior is normal. Thought content normal.  Nursing note and vitals reviewed.   BP (!) 120/92   Pulse 78   Ht 5\' 2"  (1.575 m)   Wt 194 lb (88 kg)   BMI 35.48 kg/m   Assessment and Plan: 1. Annual physical exam Recommend regular exercise Pap smear next year - Hemoglobin A1c - Lipid panel  2. Breast cancer screening Baseline and family hx - MM DIGITAL SCREENING BILATERAL; Future  3. Essential (primary) hypertension controlled - hydrochlorothiazide (HYDRODIURIL) 12.5 MG tablet; Take 1 tablet (12.5 mg total) by mouth daily.  Dispense: 90 tablet; Refill: 3 - Comprehensive metabolic panel - TSH - POCT urinalysis dipstick  4. Major depressive disorder, single episode in full remission ([place]) Doing well on current therapy - buPROPion (WELLBUTRIN XL) 300 MG 24 hr tablet; Take 1 tablet (300 mg total) by mouth daily.  Dispense: 30 tablet; Refill: 12  5. Environmental and seasonal allergies Recommend Flonase NS   Halina Maidens, MD [facility]  05/01/2016

## 2016-05-02 LAB — LIPID PANEL
CHOL/HDL RATIO: 2.3 ratio (ref 0.0–4.4)
CHOLESTEROL TOTAL: 147 mg/dL (ref 100–199)
HDL: 64 mg/dL (ref >39)
LDL Calculated: 71 mg/dL (ref 0–99)
TRIGLYCERIDES: 59 mg/dL (ref 0–149)
VLDL Cholesterol Cal: 12 mg/dL (ref 5–40)

## 2016-05-02 LAB — HEMOGLOBIN A1C
Est. average glucose Bld gHb Est-mCnc: 85 mg/dL
HEMOGLOBIN A1C: 4.6 % — AB (ref 4.8–5.6)

## 2016-05-02 LAB — TSH: TSH: 0.871 u[IU]/mL (ref 0.450–4.500)

## 2016-05-02 LAB — COMPREHENSIVE METABOLIC PANEL
ALBUMIN: 4.4 g/dL (ref 3.5–5.5)
ALK PHOS: 69 IU/L (ref 39–117)
ALT: 10 IU/L (ref 0–32)
AST: 16 IU/L (ref 0–40)
Albumin/Globulin Ratio: 1.8 (ref 1.2–2.2)
BUN / CREAT RATIO: 20 (ref 9–23)
BUN: 14 mg/dL (ref 6–20)
Bilirubin Total: 1 mg/dL (ref 0.0–1.2)
CO2: 24 mmol/L (ref 18–29)
CREATININE: 0.7 mg/dL (ref 0.57–1.00)
Calcium: 9.3 mg/dL (ref 8.7–10.2)
Chloride: 102 mmol/L (ref 96–106)
GFR calc non Af Amer: 113 mL/min/{1.73_m2} (ref >59)
GFR, EST AFRICAN AMERICAN: 130 mL/min/{1.73_m2} (ref >59)
GLUCOSE: 81 mg/dL (ref 65–99)
Globulin, Total: 2.5 g/dL (ref 1.5–4.5)
Potassium: 3.9 mmol/L (ref 3.5–5.2)
Sodium: 142 mmol/L (ref 134–144)
TOTAL PROTEIN: 6.9 g/dL (ref 6.0–8.5)

## 2016-05-28 ENCOUNTER — Ambulatory Visit
Admission: RE | Admit: 2016-05-28 | Discharge: 2016-05-28 | Disposition: A | Discharge status: Home / Self Care | Payer: 59 | Source: Ambulatory Visit | Attending: Internal Medicine | Admitting: Internal Medicine

## 2016-05-28 ENCOUNTER — Other Ambulatory Visit: Payer: Self-pay | Admitting: Internal Medicine

## 2016-05-28 DIAGNOSIS — Z1231 Encounter for screening mammogram for malignant neoplasm of breast: Secondary | ICD-10-CM | POA: Diagnosis present

## 2016-05-28 DIAGNOSIS — Z1239 Encounter for other screening for malignant neoplasm of breast: Secondary | ICD-10-CM

## 2016-10-13 ENCOUNTER — Ambulatory Visit: Payer: 59 | Admitting: Internal Medicine

## 2016-10-21 ENCOUNTER — Ambulatory Visit (INDEPENDENT_AMBULATORY_CARE_PROVIDER_SITE_OTHER): Payer: 59 | Admitting: Gastroenterology

## 2016-10-21 ENCOUNTER — Encounter: Payer: Self-pay | Admitting: Gastroenterology

## 2016-10-21 VITALS — BP 110/75 | HR 86 | Temp 97.8°F | Ht 62.0 in | Wt 199.8 lb

## 2016-10-21 DIAGNOSIS — R1011 Right upper quadrant pain: Secondary | ICD-10-CM | POA: Diagnosis not present

## 2016-10-21 NOTE — Progress Notes (Signed)
Gastroenterology Consultation  Referring Provider:     Glean Hess, MD Primary Care Physician:  Halina Maidens, MD Primary Gastroenterologist:  Dr. Jonathon Bellows  Reason for Consultation:     Abdominal pain         HPI:   Lauren [name] is a 36 y.o. y/o female referred for consultation & management  by Dr. Halina Maidens, MD.     Abdominal pain: Onset: began last month , since has had 2 -3 episodes , RUQ, cholecystectomy in 2015 after her gastric sleeve. Each episode lasts for 1 minute , Site :RUQ pain  Radiation: no  Severity :/10  Nature of pain: squeeze/twist, when she applied pressure felt better.  Aggravating factors: sitting forward  Relieving factors :sitting up makes it better  Weight loss: no  NSAID use: no  PPI use :no  Gall bladder surgery: no  Frequency of bowel movements: every day , soft  Change in bowel movements: no  Relief with bowel movements: no  Gas/Bloating/Abdominal distension: no   Last episode of pain was beginning of the month.   Past Medical History:  Diagnosis Date  . Anxiety   . Edema    ankle swelling    Past Surgical History:  Procedure Laterality Date  . BARIATRIC SURGERY  02/2014   Gastric Sleeve  . BASAL CELL CARCINOMA EXCISION  10/2015  . CHOLECYSTECTOMY  02/2014    Prior to Admission medications   Medication Sig Start Date End Date Taking? Authorizing Provider  buPROPion (WELLBUTRIN XL) 300 MG 24 hr tablet Take 1 tablet (300 mg total) by mouth daily. 05/01/16   Glean Hess, MD  hydrochlorothiazide (HYDRODIURIL) 12.5 MG tablet Take 1 tablet (12.5 mg total) by mouth daily. 05/01/16   Glean Hess, MD  levonorgestrel (MIRENA) 20 MCG/24HR IUD by Intrauterine route.    Historical Provider, MD    Family History  Problem Relation Age of Onset  . Migraines Mother   . Hypertension Mother   . Hyperlipidemia Mother   . Breast cancer Maternal Grandmother      Social History  Substance Use Topics  . Smoking status:  Never Smoker  . Smokeless tobacco: Never Used  . Alcohol use No    Allergies as of 10/21/2016  . (No Known Allergies)    Review of Systems:    All systems reviewed and negative except where noted in HPI.   Physical Exam:  BP 110/75 (BP Location: Left Arm, Patient Position: Sitting, Cuff Size: Large)   Pulse 86   Temp 97.8 F (36.6 C) (Oral)   Ht 5\' 2"  (1.575 m)   Wt 199 lb 12.8 oz (90.6 kg)   BMI 36.54 kg/m  No LMP recorded. Patient is not currently having periods (Reason: IUD). Psych:  Alert and cooperative. Normal mood and affect. General:   Alert,  Well-developed, well-nourished, pleasant and cooperative in NAD Head:  Normocephalic and atraumatic. Eyes:  Sclera clear, no icterus.   Conjunctiva pink. Ears:  Normal auditory acuity. Nose:  No deformity, discharge, or lesions. Mouth:  No deformity or lesions,oropharynx pink & moist. Neck:  Supple; no masses or thyromegaly. Lungs:  Respirations even and unlabored.  Clear throughout to auscultation.   No wheezes, crackles, or rhonchi. No acute distress. Heart:  Regular rate and rhythm; no murmurs, clicks, rubs, or gallops. Abdomen:  Normal bowel sounds.  No bruits.  Soft, non-tender and non-distended without masses, hepatosplenomegaly or hernias noted.  No guarding or rebound tenderness.    Pulses:  Normal pulses noted. Extremities:  No clubbing or edema.  No cyanosis. Neurologic:  Alert and oriented x3;  grossly normal neurologically. Psych:  Alert and cooperative. Normal mood and affect.  Imaging Studies: No results found.  Assessment and Plan:   Lauren [name] is a 36 y.o. y/o female has been referred for isolated episodes of pain in the RUQ . The description of the pain is not biliary and more musculoskeletal as it changes with position.   Plan   1. RUQ usg   Follow up as needed.   Dr Jonathon Bellows MD

## 2016-10-29 ENCOUNTER — Ambulatory Visit: Payer: 59

## 2017-02-08 ENCOUNTER — Telehealth: Payer: Self-pay | Admitting: Gastroenterology

## 2017-02-08 NOTE — Telephone Encounter (Signed)
Patient called and is ready to schedule her ultrasound.

## 2017-02-09 ENCOUNTER — Other Ambulatory Visit: Payer: Self-pay

## 2017-02-09 ENCOUNTER — Telehealth: Payer: Self-pay

## 2017-02-09 DIAGNOSIS — R1011 Right upper quadrant pain: Principal | ICD-10-CM

## 2017-02-09 DIAGNOSIS — G8929 Other chronic pain: Secondary | ICD-10-CM

## 2017-02-09 NOTE — Telephone Encounter (Signed)
LVM for pt to call back to schedule her ultrasound.

## 2017-02-09 NOTE — Telephone Encounter (Signed)
Lauren [name] has been informed of her appt at [facility] on Friday 02/12/17 at 10:15am for RUQ pain R10.11.  Instructed nothing to eat or drink after midnight.

## 2017-02-12 ENCOUNTER — Ambulatory Visit
Admission: RE | Admit: 2017-02-12 | Discharge: 2017-02-12 | Disposition: A | Discharge status: Home / Self Care | Payer: 59 | Source: Ambulatory Visit | Attending: Gastroenterology | Admitting: Gastroenterology

## 2017-02-12 DIAGNOSIS — R1011 Right upper quadrant pain: Secondary | ICD-10-CM | POA: Diagnosis present

## 2017-02-12 DIAGNOSIS — G8929 Other chronic pain: Secondary | ICD-10-CM | POA: Diagnosis present

## 2017-02-16 ENCOUNTER — Encounter: Payer: Self-pay | Admitting: Gastroenterology

## 2017-02-17 ENCOUNTER — Telehealth: Payer: Self-pay | Admitting: Gastroenterology

## 2017-02-17 NOTE — Telephone Encounter (Signed)
Patient called wanting ultrasound results.

## 2017-02-26 ENCOUNTER — Other Ambulatory Visit: Payer: Self-pay

## 2017-02-26 DIAGNOSIS — K8020 Calculus of gallbladder without cholecystitis without obstruction: Secondary | ICD-10-CM | POA: Insufficient documentation

## 2017-03-04 ENCOUNTER — Ambulatory Visit
Admission: RE | Admit: 2017-03-04 | Discharge: 2017-03-04 | Disposition: A | Discharge status: Home / Self Care | Payer: 59 | Source: Ambulatory Visit | Attending: Gastroenterology | Admitting: Gastroenterology

## 2017-03-04 ENCOUNTER — Ambulatory Visit: Payer: 59

## 2017-03-04 ENCOUNTER — Other Ambulatory Visit: Payer: Self-pay | Admitting: Gastroenterology

## 2017-03-04 DIAGNOSIS — Z9049 Acquired absence of other specified parts of digestive tract: Secondary | ICD-10-CM | POA: Diagnosis not present

## 2017-03-04 DIAGNOSIS — R1011 Right upper quadrant pain: Secondary | ICD-10-CM

## 2017-03-05 ENCOUNTER — Telehealth: Payer: Self-pay

## 2017-03-05 NOTE — Telephone Encounter (Signed)
-----   Message from Jonathon Bellows, MD sent at 03/04/2017  7:03 PM EDT ----- Normal except for hepatic steatosis

## 2017-03-05 NOTE — Telephone Encounter (Signed)
Advised patient of results per Dr. Vicente Males.  Results have been released to MyChart for the patient.

## 2017-03-22 ENCOUNTER — Other Ambulatory Visit: Payer: Self-pay

## 2017-03-22 ENCOUNTER — Telehealth: Payer: Self-pay

## 2017-03-22 NOTE — Telephone Encounter (Signed)
Patient called concerning what codes or orders were put in for 2nd ultrasound.   Advised patient ultrasound is listed as Abd Limited RUQ. Indication: Abd pain x 5 months.

## 2017-04-23 ENCOUNTER — Other Ambulatory Visit: Payer: Self-pay | Admitting: Internal Medicine

## 2017-04-23 DIAGNOSIS — I10 Essential (primary) hypertension: Secondary | ICD-10-CM

## 2017-05-04 ENCOUNTER — Ambulatory Visit (INDEPENDENT_AMBULATORY_CARE_PROVIDER_SITE_OTHER): Payer: 59 | Admitting: Internal Medicine

## 2017-05-04 ENCOUNTER — Encounter: Payer: Self-pay | Admitting: Internal Medicine

## 2017-05-04 VITALS — BP 112/62 | HR 79 | Ht 62.0 in | Wt 200.2 lb

## 2017-05-04 DIAGNOSIS — Z0000 Encounter for general adult medical examination without abnormal findings: Secondary | ICD-10-CM

## 2017-05-04 DIAGNOSIS — G43009 Migraine without aura, not intractable, without status migrainosus: Secondary | ICD-10-CM

## 2017-05-04 DIAGNOSIS — Z114 Encounter for screening for human immunodeficiency virus [HIV]: Secondary | ICD-10-CM

## 2017-05-04 DIAGNOSIS — I10 Essential (primary) hypertension: Secondary | ICD-10-CM

## 2017-05-04 DIAGNOSIS — F325 Major depressive disorder, single episode, in full remission: Secondary | ICD-10-CM | POA: Diagnosis not present

## 2017-05-04 LAB — POCT URINALYSIS DIPSTICK
Bilirubin, UA: NEGATIVE
Blood, UA: NEGATIVE
Glucose, UA: NEGATIVE
KETONES UA: NEGATIVE
LEUKOCYTES UA: NEGATIVE
NITRITE UA: NEGATIVE
PH UA: 7 (ref 5.0–8.0)
PROTEIN UA: NEGATIVE
Spec Grav, UA: 1.01 (ref 1.010–1.025)
UROBILINOGEN UA: 0.2 U/dL

## 2017-05-04 MED ORDER — BUPROPION HCL ER (XL) 300 MG PO TB24: 300.0000 mg | ORAL_TABLET | Freq: Every day | ORAL | 12 refills | Status: DC

## 2017-05-04 MED ORDER — HYDROCHLOROTHIAZIDE 12.5 MG PO TABS: 12.5000 mg | ORAL_TABLET | Freq: Every day | ORAL | 3 refills | Status: DC

## 2017-05-04 NOTE — Progress Notes (Signed)
Date:  05/04/2017   Name:  Lauren [name]   DOB:  1980-10-16   MRN:  [id number]   Chief Complaint: Annual Exam (Breast Exam. ) and Hypertension Lauren [name] is a 36 y.o. female who presents today for her Complete Annual Exam. She feels well. She reports exercising walking some. She reports she is sleeping well.   Hypertension  Associated symptoms include headaches. Pertinent negatives include no chest pain, palpitations or shortness of breath. There are no associated agents to hypertension. Past treatments include diuretics.  Migraine   This is a recurrent problem. The problem occurs seasonly. The pain does not radiate. The pain is mild. Pertinent negatives include no abdominal pain, coughing, dizziness, fever, hearing loss, tinnitus or vomiting. She has tried acetaminophen for the symptoms. Her past medical history is significant for hypertension.  Depression         This is a chronic problem.The problem is unchanged.  Associated symptoms include headaches.  Associated symptoms include no decreased concentration, no fatigue and no suicidal ideas.  Past treatments include other medications.  Compliance with treatment is good.  Previous treatment provided significant relief.     Review of Systems  Constitutional: Negative for chills, fatigue and fever.  HENT: Negative for congestion, hearing loss, tinnitus, trouble swallowing and voice change.   Eyes: Negative for visual disturbance.  Respiratory: Negative for cough, chest tightness, shortness of breath and wheezing.   Cardiovascular: Negative for chest pain, palpitations and leg swelling.  Gastrointestinal: Negative for abdominal pain, constipation, diarrhea and vomiting.  Endocrine: Negative for polydipsia and polyuria.  Genitourinary: Negative for dysuria, frequency, genital sores, vaginal bleeding and vaginal discharge.  Musculoskeletal: Negative for arthralgias, gait problem and joint swelling.  Skin: Negative for color  change and rash.  Neurological: Positive for headaches. Negative for dizziness, tremors and light-headedness.  Hematological: Negative for adenopathy. Does not bruise/bleed easily.  Psychiatric/Behavioral: Positive for depression. Negative for decreased concentration, dysphoric mood, sleep disturbance and suicidal ideas. The patient is not nervous/anxious.     Patient Active Problem List   Diagnosis Date Noted  . Environmental and seasonal allergies 05/01/2016  . Nausea 09/10/2015  . Essential (primary) hypertension 12/07/2014  . Major depressive disorder, single episode in full remission ([place]) 12/07/2014  . Headache, migraine 12/07/2014  . Neoplasm of uncertain behavior of skin 12/07/2014  . Addison anemia 12/07/2014    Prior to Admission medications   Medication Sig Start Date End Date Taking? Authorizing Provider  buPROPion (WELLBUTRIN XL) 300 MG 24 hr tablet Take 1 tablet (300 mg total) by mouth daily. 05/01/16   Glean Hess, MD  hydrochlorothiazide (HYDRODIURIL) 12.5 MG tablet TAKE 1 TABLET(12.5 MG) BY MOUTH DAILY 04/23/17   Glean Hess, MD  levonorgestrel [facility]) 20 MCG/24HR IUD by Intrauterine route.    [provider]    No Known Allergies  Past Surgical History:  Procedure Laterality Date  . BARIATRIC SURGERY  02/2014   Gastric Sleeve  . BASAL CELL CARCINOMA EXCISION  10/2015  . CHOLECYSTECTOMY  02/2014    Social History  Substance Use Topics  . Smoking status: Never Smoker  . Smokeless tobacco: Never Used  . Alcohol use No     Medication list has been reviewed and updated.  PHQ 2/9 Scores 05/04/2017  PHQ - 2 Score 0  PHQ- 9 Score 0    Physical Exam  Constitutional: She is oriented to person, place, and time. She appears well-developed and well-nourished. No distress.  HENT:  Head: Normocephalic and atraumatic.  Right Ear: Tympanic membrane and ear canal normal.  Left Ear: Tympanic membrane and ear canal normal.  Nose: Right sinus  exhibits no maxillary sinus tenderness. Left sinus exhibits no maxillary sinus tenderness.  Mouth/Throat: Uvula is midline and oropharynx is clear and moist.  Eyes: Conjunctivae and EOM are normal. Right eye exhibits no discharge. Left eye exhibits no discharge. No scleral icterus.  Neck: Normal range of motion. Carotid bruit is not present. No erythema present. No thyromegaly present.  Cardiovascular: Normal rate, regular rhythm, normal heart sounds and normal pulses.   Pulmonary/Chest: Effort normal. No respiratory distress. She has no wheezes. Right breast exhibits no mass, no nipple discharge, no skin change and no tenderness. Left breast exhibits no mass, no nipple discharge, no skin change and no tenderness.  Abdominal: Soft. Bowel sounds are normal. There is no hepatosplenomegaly. There is no tenderness. There is no CVA tenderness.  Musculoskeletal: Normal range of motion.  Lymphadenopathy:    She has no cervical adenopathy.    She has no axillary adenopathy.  Neurological: She is alert and oriented to person, place, and time. She has normal reflexes. No cranial nerve deficit or sensory deficit.  Skin: Skin is warm, dry and intact. No rash noted.  Psychiatric: She has a normal mood and affect. Her speech is normal and behavior is normal. Thought content normal.  Nursing note and vitals reviewed.   BP 112/62 (BP Location: Right Arm, Patient Position: Sitting, Cuff Size: Large)   Pulse 79   Ht 5\' 2"  (1.575 m)   Wt 200 lb 3.2 oz (90.8 kg)   SpO2 100%   BMI 36.62 kg/m   Assessment and Plan: 1. Annual physical exam Resume diet and exercise - Hemoglobin A1c  2. Essential (primary) hypertension controlled - hydrochlorothiazide (HYDRODIURIL) 12.5 MG tablet; Take 1 tablet (12.5 mg total) by mouth daily.  Dispense: 90 tablet; Refill: 3 - CBC with Differential/Platelet - Comprehensive metabolic panel - Lipid panel - POCT urinalysis dipstick  3. Migraine without aura and without  status migrainosus, not intractable Controlled with tylenol PRN  4. Major depressive disorder, single episode in full remission ([place]) Doing well on medication - buPROPion (WELLBUTRIN XL) 300 MG 24 hr tablet; Take 1 tablet (300 mg total) by mouth daily.  Dispense: 30 tablet; Refill: 12 - TSH  5. Encounter for screening for HIV - HIV antibody   Meds ordered this encounter  Medications  . hydrochlorothiazide (HYDRODIURIL) 12.5 MG tablet    Sig: Take 1 tablet (12.5 mg total) by mouth daily.    Dispense:  90 tablet    Refill:  3  . buPROPion (WELLBUTRIN XL) 300 MG 24 hr tablet    Sig: Take 1 tablet (300 mg total) by mouth daily.    Dispense:  30 tablet    Refill:  12    Partially dictated using Editor, commissioning. Any errors are unintentional.  Halina Maidens, MD [facility]  05/04/2017

## 2017-05-05 LAB — CBC WITH DIFFERENTIAL/PLATELET
BASOS ABS: 0 10*3/uL (ref 0.0–0.2)
Basos: 1 %
EOS (ABSOLUTE): 0.1 10*3/uL (ref 0.0–0.4)
Eos: 2 %
HEMOGLOBIN: 14.5 g/dL (ref 11.1–15.9)
Hematocrit: 42.9 % (ref 34.0–46.6)
Immature Grans (Abs): 0 10*3/uL (ref 0.0–0.1)
Immature Granulocytes: 0 %
LYMPHS ABS: 1.8 10*3/uL (ref 0.7–3.1)
LYMPHS: 35 %
MCH: 31.3 pg (ref 26.6–33.0)
MCHC: 33.8 g/dL (ref 31.5–35.7)
MCV: 93 fL (ref 79–97)
MONOCYTES: 8 %
Monocytes Absolute: 0.4 10*3/uL (ref 0.1–0.9)
NEUTROS PCT: 54 %
Neutrophils Absolute: 2.9 10*3/uL (ref 1.4–7.0)
PLATELETS: 210 10*3/uL (ref 150–379)
RBC: 4.63 x10E6/uL (ref 3.77–5.28)
RDW: 13 % (ref 12.3–15.4)
WBC: 5.2 10*3/uL (ref 3.4–10.8)

## 2017-05-05 LAB — LIPID PANEL
CHOLESTEROL TOTAL: 145 mg/dL (ref 100–199)
Chol/HDL Ratio: 2.4 ratio (ref 0.0–4.4)
HDL: 60 mg/dL (ref >39)
LDL Calculated: 70 mg/dL (ref 0–99)
TRIGLYCERIDES: 73 mg/dL (ref 0–149)
VLDL CHOLESTEROL CAL: 15 mg/dL (ref 5–40)

## 2017-05-05 LAB — COMPREHENSIVE METABOLIC PANEL
ALBUMIN: 4.8 g/dL (ref 3.5–5.5)
ALK PHOS: 85 IU/L (ref 39–117)
ALT: 9 IU/L (ref 0–32)
AST: 17 IU/L (ref 0–40)
Albumin/Globulin Ratio: 2 (ref 1.2–2.2)
BUN / CREAT RATIO: 18 (ref 9–23)
BUN: 14 mg/dL (ref 6–20)
Bilirubin Total: 0.9 mg/dL (ref 0.0–1.2)
CHLORIDE: 100 mmol/L (ref 96–106)
CO2: 24 mmol/L (ref 20–29)
CREATININE: 0.78 mg/dL (ref 0.57–1.00)
Calcium: 9.5 mg/dL (ref 8.7–10.2)
GFR calc Af Amer: 113 mL/min/{1.73_m2} (ref >59)
GFR calc non Af Amer: 98 mL/min/{1.73_m2} (ref >59)
GLUCOSE: 72 mg/dL (ref 65–99)
Globulin, Total: 2.4 g/dL (ref 1.5–4.5)
Potassium: 3.6 mmol/L (ref 3.5–5.2)
Sodium: 141 mmol/L (ref 134–144)
Total Protein: 7.2 g/dL (ref 6.0–8.5)

## 2017-05-05 LAB — HIV ANTIBODY (ROUTINE TESTING W REFLEX): HIV SCREEN 4TH GENERATION: NONREACTIVE

## 2017-05-05 LAB — TSH: TSH: 0.915 u[IU]/mL (ref 0.450–4.500)

## 2017-05-05 LAB — HEMOGLOBIN A1C
Est. average glucose Bld gHb Est-mCnc: 88 mg/dL
Hgb A1c MFr Bld: 4.7 % — ABNORMAL LOW (ref 4.8–5.6)

## 2017-09-25 ENCOUNTER — Encounter: Payer: Self-pay | Admitting: Internal Medicine

## 2017-09-27 ENCOUNTER — Other Ambulatory Visit: Payer: Self-pay | Admitting: Internal Medicine

## 2017-09-27 MED ORDER — BUPROPION HCL ER (XL) 150 MG PO TB24: 150.0000 mg | ORAL_TABLET | Freq: Two times a day (BID) | ORAL | 5 refills | Status: DC

## 2017-09-27 NOTE — Telephone Encounter (Signed)
Patient wants to decrease medication. Please Advise.

## 2017-09-28 ENCOUNTER — Other Ambulatory Visit: Payer: Self-pay | Admitting: Internal Medicine

## 2017-09-28 DIAGNOSIS — I10 Essential (primary) hypertension: Secondary | ICD-10-CM

## 2017-09-28 MED ORDER — HYDROCHLOROTHIAZIDE 12.5 MG PO TABS: 12.5000 mg | ORAL_TABLET | Freq: Every day | ORAL | 1 refills | Status: DC

## 2017-09-28 MED ORDER — BUPROPION HCL ER (XL) 150 MG PO TB24: 150.0000 mg | ORAL_TABLET | Freq: Every day | ORAL | 1 refills | Status: DC

## 2017-09-28 NOTE — Telephone Encounter (Signed)
Patient response on medication.

## 2018-04-07 DIAGNOSIS — D485 Neoplasm of uncertain behavior of skin: Secondary | ICD-10-CM | POA: Diagnosis not present

## 2018-04-07 DIAGNOSIS — L738 Other specified follicular disorders: Secondary | ICD-10-CM | POA: Diagnosis not present

## 2018-04-26 ENCOUNTER — Ambulatory Visit (INDEPENDENT_AMBULATORY_CARE_PROVIDER_SITE_OTHER): Payer: BLUE CROSS/BLUE SHIELD | Admitting: Internal Medicine

## 2018-04-26 ENCOUNTER — Encounter: Payer: Self-pay | Admitting: Internal Medicine

## 2018-04-26 VITALS — BP 124/68 | HR 76 | Temp 98.2°F | Ht 62.0 in | Wt 202.0 lb

## 2018-04-26 DIAGNOSIS — R079 Chest pain, unspecified: Secondary | ICD-10-CM

## 2018-04-26 NOTE — Progress Notes (Signed)
Date:  04/26/2018   Name:  Lauren [name]   DOB:  [birth year]   MRN:  [id number]   Chief Complaint: Chest Pain (On and off chest pain. Started 1.5 week ago. Had episode last night and this morning. Stabbing pain on middle and left side. The pain lasts about 30 secs to a minute. )  Chest Pain   This is a new problem. The current episode started 1 to 4 weeks ago. The problem occurs daily. The problem has been unchanged. The pain is present in the substernal region and lateral region. The pain is mild. The quality of the pain is described as stabbing. The pain does not radiate. Associated symptoms include nausea. Pertinent negatives include no abdominal pain, back pain, cough, diaphoresis, dizziness, fever, headaches, hemoptysis, palpitations, shortness of breath or sputum production.    Review of Systems  Constitutional: Negative for diaphoresis and fever.  Respiratory: Negative for cough, hemoptysis, sputum production, shortness of breath and wheezing.   Cardiovascular: Positive for chest pain. Negative for palpitations and leg swelling.  Gastrointestinal: Positive for nausea. Negative for abdominal pain.  Musculoskeletal: Negative for arthralgias, back pain and joint swelling.  Allergic/Immunologic: Negative for food allergies.  Neurological: Negative for dizziness, light-headedness and headaches.  Psychiatric/Behavioral: The patient is not nervous/anxious.     Patient Active Problem List   Diagnosis Date Noted  . Environmental and seasonal allergies 05/01/2016  . Nausea 09/10/2015  . Essential (primary) hypertension 12/07/2014  . Major depressive disorder, single episode in full remission ([place]) 12/07/2014  . Headache, migraine 12/07/2014  . Neoplasm of uncertain behavior of skin 12/07/2014  . Addison anemia 12/07/2014    No Known Allergies  Past Surgical History:  Procedure Laterality Date  . BARIATRIC SURGERY  02/2014   Gastric Sleeve  . BASAL CELL CARCINOMA EXCISION   10/2015  . CHOLECYSTECTOMY  02/2014    Social History   Tobacco Use  . Smoking status: Never Smoker  . Smokeless tobacco: Never Used  Substance Use Topics  . Alcohol use: No    Alcohol/week: 0.0 standard drinks  . Drug use: No     Medication list has been reviewed and updated.  Current Meds  Medication Sig  . buPROPion (WELLBUTRIN XL) 150 MG 24 hr tablet Take 1 tablet (150 mg total) by mouth daily.  . hydrochlorothiazide (HYDRODIURIL) 12.5 MG tablet Take 1 tablet (12.5 mg total) by mouth daily.  [facility] Kitchen levonorgestrel (MIRENA) 20 MCG/24HR IUD by Intrauterine route.    PHQ 2/9 Scores 04/26/2018 05/04/2017  PHQ - 2 Score 0 0  PHQ- 9 Score 0 0    Physical Exam  Constitutional: She is oriented to person, place, and time. She appears well-developed and well-nourished. No distress.  HENT:  Head: Normocephalic and atraumatic.  Neck: Normal range of motion. Neck supple.  Cardiovascular: Normal rate and regular rhythm.  No extrasystoles are present. Exam reveals no distant heart sounds and no friction rub.  No murmur heard.  No diastolic murmur is present. Pulmonary/Chest: Effort normal. No respiratory distress. She has no decreased breath sounds. She has no wheezes. She has no rales. Chest wall is not dull to percussion. She exhibits no mass and no tenderness.  Musculoskeletal: Normal range of motion.       Right lower leg: Normal. She exhibits no tenderness and no edema.       Left lower leg: Normal. She exhibits no tenderness and no edema.  Neurological: She is alert and oriented to person, place, and  time.  Skin: Skin is warm and dry. No rash noted.  Psychiatric: She has a normal mood and affect. Her speech is normal and behavior is normal. Thought content normal.  Nursing note and vitals reviewed.   BP 124/68 (BP Location: Right Arm, Patient Position: Sitting, Cuff Size: Normal)   Pulse 76   Ht 5\' 2"  (1.575 m)   Wt 202 lb (91.6 kg)   SpO2 99%   BMI 36.95 kg/m   Assessment  and Plan: 1. Chest pain, unspecified type Suspect pleuritic vs MSK Take tylenol 500 mg tid x 1 week - EKG 12-Lead - NSR @ 84; WNL   Partially dictated using Editor, commissioning. Any errors are unintentional.  Halina Maidens, MD [facility]  04/26/2018

## 2018-04-26 NOTE — Patient Instructions (Signed)
Tylenol 500 mg three times a day for the next week  Pay attention to things that may make the symptoms worse

## 2018-04-28 ENCOUNTER — Encounter: Payer: Self-pay | Admitting: Internal Medicine

## 2018-04-28 DIAGNOSIS — Z23 Encounter for immunization: Secondary | ICD-10-CM | POA: Diagnosis not present

## 2018-04-28 NOTE — Telephone Encounter (Signed)
Please advise patient mychart message regaurding chest pain that was discussed at visit.

## 2018-05-04 ENCOUNTER — Other Ambulatory Visit: Payer: Self-pay | Admitting: Internal Medicine

## 2018-05-04 DIAGNOSIS — I10 Essential (primary) hypertension: Secondary | ICD-10-CM

## 2018-05-09 ENCOUNTER — Encounter: Payer: Self-pay | Admitting: Internal Medicine

## 2018-05-09 ENCOUNTER — Ambulatory Visit (INDEPENDENT_AMBULATORY_CARE_PROVIDER_SITE_OTHER): Payer: BLUE CROSS/BLUE SHIELD | Admitting: Internal Medicine

## 2018-05-09 VITALS — BP 104/68 | HR 70 | Ht 62.0 in | Wt 206.4 lb

## 2018-05-09 DIAGNOSIS — F325 Major depressive disorder, single episode, in full remission: Secondary | ICD-10-CM

## 2018-05-09 DIAGNOSIS — Z9884 Bariatric surgery status: Secondary | ICD-10-CM | POA: Diagnosis not present

## 2018-05-09 DIAGNOSIS — I10 Essential (primary) hypertension: Secondary | ICD-10-CM

## 2018-05-09 DIAGNOSIS — Z0000 Encounter for general adult medical examination without abnormal findings: Secondary | ICD-10-CM

## 2018-05-09 DIAGNOSIS — G43009 Migraine without aura, not intractable, without status migrainosus: Secondary | ICD-10-CM

## 2018-05-09 DIAGNOSIS — R739 Hyperglycemia, unspecified: Secondary | ICD-10-CM | POA: Diagnosis not present

## 2018-05-09 DIAGNOSIS — R7989 Other specified abnormal findings of blood chemistry: Secondary | ICD-10-CM | POA: Diagnosis not present

## 2018-05-09 DIAGNOSIS — E559 Vitamin D deficiency, unspecified: Secondary | ICD-10-CM

## 2018-05-09 LAB — POCT URINALYSIS DIPSTICK
BILIRUBIN UA: NEGATIVE
GLUCOSE UA: NEGATIVE
KETONES UA: NEGATIVE
LEUKOCYTES UA: NEGATIVE
Nitrite, UA: NEGATIVE
Protein, UA: NEGATIVE
RBC UA: NEGATIVE
SPEC GRAV UA: 1.02 (ref 1.010–1.025)
Urobilinogen, UA: 0.2 E.U./dL
pH, UA: 6 (ref 5.0–8.0)

## 2018-05-09 NOTE — Progress Notes (Signed)
Date:  05/09/2018   Name:  Lauren [name]   DOB:  30-Sep-1980   MRN:  [id number]   Chief Complaint: Annual Exam (Breast Exam. No pap until next year.) Lauren [name] is a 37 y.o. female who presents today for her Complete Annual Exam. She feels fairly well. She reports exercising some. She reports she is sleeping fairly well. PAP is due next year.  She has an IUD and has only light spotting.  She denies breast issues. She has occasional migraine HA treated with otc meds.  Hypertension  This is a chronic problem. The problem is controlled. Associated symptoms include headaches. Pertinent negatives include no chest pain, palpitations or shortness of breath. Past treatments include diuretics. The current treatment provides significant improvement.  Depression         This is a chronic problem.  The problem has been resolved since onset.  Associated symptoms include headaches.  Associated symptoms include no fatigue.  Past treatments include other medications.  Compliance with treatment is good.  Previous treatment provided significant relief. s/p bariatric surgery - she has not been taking any vitamins or supplements.  Has not had labs checked in a year.  Lowest weight 190, now 202 but is not exercising. Also found to be vitamin D def in the past. Chest pain - seen earlier for chest pain, normal EKG felt to be MSK.  She took tylenol for several days and the discomfort has now resolved.   Review of Systems  Constitutional: Negative for chills, fatigue and fever.  HENT: Negative for congestion, hearing loss, tinnitus, trouble swallowing and voice change.   Eyes: Negative for visual disturbance.  Respiratory: Negative for cough, chest tightness, shortness of breath and wheezing.   Cardiovascular: Negative for chest pain, palpitations and leg swelling.  Gastrointestinal: Negative for abdominal pain, constipation, diarrhea and vomiting.  Endocrine: Negative for polydipsia and polyuria.    Genitourinary: Negative for dysuria, frequency, genital sores, menstrual problem, vaginal bleeding and vaginal discharge.  Musculoskeletal: Negative for arthralgias, gait problem and joint swelling.  Skin: Negative for color change and rash.  Neurological: Positive for headaches. Negative for dizziness, tremors and light-headedness.  Hematological: Negative for adenopathy. Does not bruise/bleed easily.  Psychiatric/Behavioral: Positive for depression. Negative for dysphoric mood and sleep disturbance. The patient is not nervous/anxious.     Patient Active Problem List   Diagnosis Date Noted  . Environmental and seasonal allergies 05/01/2016  . Nausea 09/10/2015  . Essential (primary) hypertension 12/07/2014  . Major depressive disorder, single episode in full remission ([place]) 12/07/2014  . Headache, migraine 12/07/2014  . Neoplasm of uncertain behavior of skin 12/07/2014  . Addison anemia 12/07/2014    No Known Allergies  Past Surgical History:  Procedure Laterality Date  . BARIATRIC SURGERY  02/2014   Gastric Sleeve  . BASAL CELL CARCINOMA EXCISION  10/2015  . CHOLECYSTECTOMY  02/2014    Social History   Tobacco Use  . Smoking status: Never Smoker  . Smokeless tobacco: Never Used  Substance Use Topics  . Alcohol use: No    Alcohol/week: 0.0 standard drinks  . Drug use: No     Medication list has been reviewed and updated.  Current Meds  Medication Sig  . buPROPion (WELLBUTRIN XL) 150 MG 24 hr tablet TAKE 1 TABLET BY MOUTH ONCE DAILY  . hydrochlorothiazide (HYDRODIURIL) 12.5 MG tablet TAKE 1 TABLET BY MOUTH ONCE DAILY  . levonorgestrel (MIRENA) 20 MCG/24HR IUD by Intrauterine route.    PHQ  2/9 Scores 04/26/2018 05/04/2017  PHQ - 2 Score 0 0  PHQ- 9 Score 0 0    Physical Exam  Constitutional: She is oriented to person, place, and time. She appears well-developed and well-nourished. No distress.  HENT:  Head: Normocephalic and atraumatic.  Right Ear: Tympanic  membrane and ear canal normal.  Left Ear: Tympanic membrane and ear canal normal.  Nose: Right sinus exhibits no maxillary sinus tenderness. Left sinus exhibits no maxillary sinus tenderness.  Mouth/Throat: Uvula is midline and oropharynx is clear and moist.  Eyes: Conjunctivae and EOM are normal. Right eye exhibits no discharge. Left eye exhibits no discharge. No scleral icterus.  Neck: Normal range of motion. Carotid bruit is not present. No erythema present. No thyromegaly present.  Cardiovascular: Normal rate, regular rhythm, normal heart sounds and normal pulses.  Pulmonary/Chest: Effort normal. No respiratory distress. She has no wheezes. Right breast exhibits no mass, no nipple discharge, no skin change and no tenderness. Left breast exhibits no mass, no nipple discharge, no skin change and no tenderness.  Abdominal: Soft. Bowel sounds are normal. There is no hepatosplenomegaly. There is no tenderness. There is no CVA tenderness.  Musculoskeletal: Normal range of motion.  Lymphadenopathy:    She has no cervical adenopathy.    She has no axillary adenopathy.  Neurological: She is alert and oriented to person, place, and time. She has normal reflexes. No cranial nerve deficit or sensory deficit.  Skin: Skin is warm, dry and intact. No rash noted.  Psychiatric: She has a normal mood and affect. Her speech is normal and behavior is normal. Judgment and thought content normal. Cognition and memory are normal. She expresses no suicidal ideation. She expresses no suicidal plans.  Nursing note and vitals reviewed.   BP 104/68 (BP Location: Right Arm, Patient Position: Sitting, Cuff Size: Normal)   Pulse 70   Ht 5\' 2"  (1.575 m)   Wt 206 lb 6.4 oz (93.6 kg)   SpO2 99%   BMI 37.75 kg/m   Assessment and Plan: 1. Annual physical exam Begin regular exercise Annual mammogram starting at age 3 - Lipid panel - POCT urinalysis dipstick  2. Essential (primary) hypertension controlled - CBC  with Differential/Platelet - Comprehensive metabolic panel  3. Major depressive disorder, single episode in full remission ([place]) Doing well on bupropion - continue - TSH  4. Migraine without aura and without status migrainosus, not intractable Minimal occurances  5. Bariatric surgery status Check B12 - Vitamin B12  6. Vitamin D deficiency - VITAMIN D 25 Hydroxy (Vit-D Deficiency, Fractures)   Partially dictated using Editor, commissioning. Any errors are unintentional.  Halina Maidens, MD [facility]  05/09/2018

## 2018-05-09 NOTE — Patient Instructions (Signed)

## 2018-05-10 ENCOUNTER — Encounter: Payer: Self-pay | Admitting: Internal Medicine

## 2018-05-10 LAB — COMPREHENSIVE METABOLIC PANEL
ALBUMIN: 4.3 g/dL (ref 3.5–5.5)
ALK PHOS: 73 IU/L (ref 39–117)
ALT: 10 IU/L (ref 0–32)
AST: 14 IU/L (ref 0–40)
Albumin/Globulin Ratio: 2 (ref 1.2–2.2)
BILIRUBIN TOTAL: 0.7 mg/dL (ref 0.0–1.2)
BUN / CREAT RATIO: 15 (ref 9–23)
BUN: 11 mg/dL (ref 6–20)
CHLORIDE: 105 mmol/L (ref 96–106)
CO2: 25 mmol/L (ref 20–29)
Calcium: 9 mg/dL (ref 8.7–10.2)
Creatinine, Ser: 0.73 mg/dL (ref 0.57–1.00)
GFR calc non Af Amer: 106 mL/min/{1.73_m2} (ref >59)
GFR, EST AFRICAN AMERICAN: 122 mL/min/{1.73_m2} (ref >59)
GLOBULIN, TOTAL: 2.2 g/dL (ref 1.5–4.5)
Glucose: 81 mg/dL (ref 65–99)
POTASSIUM: 3.6 mmol/L (ref 3.5–5.2)
Sodium: 144 mmol/L (ref 134–144)
Total Protein: 6.5 g/dL (ref 6.0–8.5)

## 2018-05-10 LAB — CBC WITH DIFFERENTIAL/PLATELET
BASOS: 1 %
Basophils Absolute: 0 10*3/uL (ref 0.0–0.2)
EOS (ABSOLUTE): 0.1 10*3/uL (ref 0.0–0.4)
EOS: 2 %
HEMATOCRIT: 39 % (ref 34.0–46.6)
Hemoglobin: 13.4 g/dL (ref 11.1–15.9)
Immature Grans (Abs): 0 10*3/uL (ref 0.0–0.1)
Immature Granulocytes: 0 %
LYMPHS ABS: 1.2 10*3/uL (ref 0.7–3.1)
Lymphs: 29 %
MCH: 32.4 pg (ref 26.6–33.0)
MCHC: 34.4 g/dL (ref 31.5–35.7)
MCV: 94 fL (ref 79–97)
Monocytes Absolute: 0.3 10*3/uL (ref 0.1–0.9)
Monocytes: 7 %
NEUTROS ABS: 2.6 10*3/uL (ref 1.4–7.0)
Neutrophils: 61 %
Platelets: 173 10*3/uL (ref 150–450)
RBC: 4.14 x10E6/uL (ref 3.77–5.28)
RDW: 12.1 % — ABNORMAL LOW (ref 12.3–15.4)
WBC: 4.3 10*3/uL (ref 3.4–10.8)

## 2018-05-10 LAB — LIPID PANEL
Chol/HDL Ratio: 2.4 ratio (ref 0.0–4.4)
Cholesterol, Total: 140 mg/dL (ref 100–199)
HDL: 58 mg/dL (ref >39)
LDL Calculated: 69 mg/dL (ref 0–99)
Triglycerides: 65 mg/dL (ref 0–149)
VLDL Cholesterol Cal: 13 mg/dL (ref 5–40)

## 2018-05-10 LAB — VITAMIN D 25 HYDROXY (VIT D DEFICIENCY, FRACTURES): Vit D, 25-Hydroxy: 33.7 ng/mL (ref 30.0–100.0)

## 2018-05-10 LAB — TSH: TSH: 1.4 u[IU]/mL (ref 0.450–4.500)

## 2018-05-10 LAB — VITAMIN B12: VITAMIN B 12: 420 pg/mL (ref 232–1245)

## 2018-05-14 LAB — SPECIMEN STATUS REPORT

## 2018-05-14 LAB — HGB A1C W/O EAG: Hgb A1c MFr Bld: 4.8 % (ref 4.8–5.6)

## 2018-07-24 ENCOUNTER — Encounter: Payer: Self-pay | Admitting: Internal Medicine

## 2018-07-25 ENCOUNTER — Encounter: Payer: Self-pay | Admitting: Internal Medicine

## 2018-07-25 NOTE — Telephone Encounter (Signed)
Patient additional message.

## 2018-07-25 NOTE — Telephone Encounter (Signed)
Please Advise Patient my chart message. Dr Army Melia said the patient should only have been charged for her $20 copay for discussing additional things but she is saying her bill is $164.  Please Advise.

## 2018-08-19 ENCOUNTER — Other Ambulatory Visit: Payer: Self-pay

## 2018-10-22 ENCOUNTER — Other Ambulatory Visit: Payer: Self-pay | Admitting: Internal Medicine

## 2018-10-22 DIAGNOSIS — I10 Essential (primary) hypertension: Secondary | ICD-10-CM

## 2018-11-01 ENCOUNTER — Other Ambulatory Visit: Payer: Self-pay | Admitting: Internal Medicine

## 2018-11-01 DIAGNOSIS — I10 Essential (primary) hypertension: Secondary | ICD-10-CM

## 2018-11-01 MED ORDER — BUPROPION HCL ER (XL) 150 MG PO TB24: 150.0000 mg | ORAL_TABLET | Freq: Every day | ORAL | 0 refills | Status: DC

## 2018-11-01 MED ORDER — HYDROCHLOROTHIAZIDE 12.5 MG PO TABS: 12.5000 mg | ORAL_TABLET | Freq: Every day | ORAL | 0 refills | Status: DC

## 2018-11-01 NOTE — Telephone Encounter (Signed)
We have not seen patient since October. She needs a Bp/ depression follow up in the next 90 days.  Thanks.

## 2018-12-13 ENCOUNTER — Ambulatory Visit (INDEPENDENT_AMBULATORY_CARE_PROVIDER_SITE_OTHER): Payer: BLUE CROSS/BLUE SHIELD | Admitting: Internal Medicine

## 2018-12-13 ENCOUNTER — Encounter: Payer: Self-pay | Admitting: Internal Medicine

## 2018-12-13 ENCOUNTER — Other Ambulatory Visit: Payer: Self-pay

## 2018-12-13 DIAGNOSIS — I10 Essential (primary) hypertension: Secondary | ICD-10-CM | POA: Diagnosis not present

## 2018-12-13 DIAGNOSIS — F411 Generalized anxiety disorder: Secondary | ICD-10-CM | POA: Insufficient documentation

## 2018-12-13 DIAGNOSIS — F419 Anxiety disorder, unspecified: Secondary | ICD-10-CM | POA: Insufficient documentation

## 2018-12-13 MED ORDER — HYDROCHLOROTHIAZIDE 12.5 MG PO TABS: 12.5000 mg | ORAL_TABLET | Freq: Every day | ORAL | 1 refills | Status: DC

## 2018-12-13 NOTE — Progress Notes (Signed)
Date:  12/13/2018   Name:  Lauren [name]   DOB:  08-22-1980   MRN:  [id number]   I connected with this patient, Lauren [name], by telephone at the patient's home.  I verified that I am speaking with the correct person using two identifiers. This visit was conducted via telephone due to the Covid-19 outbreak from my office at [facility] in [place], [place]. I discussed the limitations, risks, security and privacy concerns of performing an evaluation and management service by telephone. I also discussed with the patient that there may be a patient responsible charge related to this service. The patient expressed understanding and agreed to proceed.  Chief Complaint: Depression (GAD7- 48 /// PHQ9- 3 ) and Hypertension  Hypertension  This is a chronic problem. The problem is controlled. Associated symptoms include anxiety. Pertinent negatives include no chest pain, headaches, palpitations or shortness of breath. Past treatments include diuretics. The current treatment provides significant improvement.  Anxiety  Presents for follow-up visit. Symptoms include excessive worry, insomnia, irritability and nervous/anxious behavior. Patient reports no chest pain, confusion, decreased concentration, dizziness, feeling of choking, palpitations, panic or shortness of breath. Symptoms occur most days. The severity of symptoms is moderate. The quality of sleep is fair. Nighttime awakenings: occasional.   Compliance with medications is 76-100% (taking bupropion 150 mg per day because 300 mg per day was too expensive.).   Lab Results  Component Value Date   CREATININE 0.73 05/09/2018   BUN 11 05/09/2018   NA 144 05/09/2018   K 3.6 05/09/2018   CL 105 05/09/2018   CO2 25 05/09/2018   Lab Results  Component Value Date   WBC 4.3 05/09/2018   HGB 13.4 05/09/2018   HCT 39.0 05/09/2018   MCV 94 05/09/2018   PLT 173 05/09/2018   Lab Results  Component Value Date   TSH 1.400 05/09/2018   Lab Results  Component Value Date   CHOL 140 05/09/2018   HDL 58 05/09/2018   LDLCALC 69 05/09/2018   TRIG 65 05/09/2018   CHOLHDL 2.4 05/09/2018     Review of Systems  Constitutional: Positive for irritability. Negative for appetite change, fatigue, fever and unexpected weight change.  HENT: Negative for tinnitus and trouble swallowing.   Eyes: Negative for visual disturbance.  Respiratory: Negative for cough, chest tightness and shortness of breath.   Cardiovascular: Negative for chest pain, palpitations and leg swelling.  Gastrointestinal: Negative for abdominal pain, constipation and diarrhea.  Endocrine: Negative for polydipsia and polyuria.  Genitourinary: Negative for dysuria and hematuria.  Musculoskeletal: Negative for arthralgias.  Neurological: Negative for dizziness, tremors, numbness and headaches.  Hematological: Negative for adenopathy.  Psychiatric/Behavioral: Positive for sleep disturbance. Negative for confusion, decreased concentration and dysphoric mood. The patient is nervous/anxious and has insomnia.     Patient Active Problem List   Diagnosis Date Noted  . Vitamin D deficiency 05/09/2018  . Bariatric surgery status 05/09/2018  . Environmental and seasonal allergies 05/01/2016  . Nausea 09/10/2015  . Essential (primary) hypertension 12/07/2014  . Major depressive disorder, single episode in full remission ([place]) 12/07/2014  . Headache, migraine 12/07/2014  . Neoplasm of uncertain behavior of skin 12/07/2014  . Addison anemia 12/07/2014    No Known Allergies  Past Surgical History:  Procedure Laterality Date  . BARIATRIC SURGERY  02/2014   Gastric Sleeve  . BASAL CELL CARCINOMA EXCISION  10/2015  . CHOLECYSTECTOMY  02/2014    Social History   Tobacco Use  .  Smoking status: Never Smoker  . Smokeless tobacco: Never Used  Substance Use Topics  . Alcohol use: No    Alcohol/week: 0.0 standard drinks  . Drug use: No     Medication list has been  reviewed and updated.  Current Meds  Medication Sig  . buPROPion (WELLBUTRIN XL) 150 MG 24 hr tablet Take 1 tablet (150 mg total) by mouth daily.  . hydrochlorothiazide (HYDRODIURIL) 12.5 MG tablet Take 1 tablet (12.5 mg total) by mouth daily.  Marland Kitchen levonorgestrel (MIRENA) 20 MCG/24HR IUD by Intrauterine route.  . [DISCONTINUED] hydrochlorothiazide (HYDRODIURIL) 12.5 MG tablet Take 1 tablet (12.5 mg total) by mouth daily.    PHQ 2/9 Scores 12/13/2018 04/26/2018 05/04/2017  PHQ - 2 Score 3 0 0  PHQ- 9 Score 6 0 0   GAD 7 : Generalized Anxiety Score 12/13/2018  Nervous, Anxious, on Edge 2  Control/stop worrying 3  Worry too much - different things 3  Trouble relaxing 0  Restless 0  Easily annoyed or irritable 3  Afraid - awful might happen 3  Total GAD 7 Score 14  Anxiety Difficulty Very difficult      BP Readings from Last 3 Encounters:  12/13/18 122/80  05/09/18 104/68  04/26/18 124/68    Physical Exam Pulmonary:     Comments: Normal speech, no evidence of dyspnea, no cough Neurological:     Mental Status: She is alert.  Psychiatric:        Attention and Perception: Attention normal.        Mood and Affect: Mood normal.        Speech: Speech normal.        Behavior: Behavior normal.        Thought Content: Thought content normal.        Cognition and Memory: Cognition normal.        Judgment: Judgment normal.     Wt Readings from Last 3 Encounters:  12/13/18 210 lb (95.3 kg)  05/09/18 206 lb 6.4 oz (93.6 kg)  04/26/18 202 lb (91.6 kg)    BP 122/80   Ht 5\' 2"  (1.575 m)   Wt 210 lb (95.3 kg)   BMI 38.41 kg/m   Assessment and Plan: 1. Essential (primary) hypertension Controlled, continue current therapy HCTZ - hydrochlorothiazide (HYDRODIURIL) 12.5 MG tablet; Take 1 tablet (12.5 mg total) by mouth daily.  Dispense: 90 tablet; Refill: 1  2. Generalized anxiety disorder Increase bupropion to 150 mg bid Call for Rx if helpful Pt informed about GoodRx to help  save money on prescription medications  Pt had to cancel CPX for November - she is reminded to call back and schedule when she can. I spent 12 minutes on this encounter. Partially dictated using Editor, commissioning. Any errors are unintentional.  Halina Maidens, MD [facility]  12/13/2018

## 2019-01-04 DIAGNOSIS — K011 Impacted teeth: Secondary | ICD-10-CM | POA: Diagnosis not present

## 2019-04-26 ENCOUNTER — Other Ambulatory Visit: Payer: BLUE CROSS/BLUE SHIELD

## 2019-04-26 ENCOUNTER — Other Ambulatory Visit: Payer: Self-pay

## 2019-04-26 DIAGNOSIS — R300 Dysuria: Secondary | ICD-10-CM

## 2019-04-27 DIAGNOSIS — R300 Dysuria: Secondary | ICD-10-CM | POA: Diagnosis not present

## 2019-04-29 LAB — URINE CULTURE

## 2019-05-01 DIAGNOSIS — Z23 Encounter for immunization: Secondary | ICD-10-CM | POA: Diagnosis not present

## 2019-05-11 ENCOUNTER — Encounter: Payer: BLUE CROSS/BLUE SHIELD | Admitting: Internal Medicine

## 2019-06-02 ENCOUNTER — Encounter: Payer: BLUE CROSS/BLUE SHIELD | Admitting: Obstetrics and Gynecology

## 2019-06-06 ENCOUNTER — Other Ambulatory Visit: Payer: Self-pay

## 2019-06-06 ENCOUNTER — Other Ambulatory Visit: Payer: Self-pay | Admitting: Internal Medicine

## 2019-06-06 ENCOUNTER — Encounter: Payer: Self-pay | Admitting: Internal Medicine

## 2019-06-06 MED ORDER — BUPROPION HCL ER (XL) 150 MG PO TB24: 150.0000 mg | ORAL_TABLET | Freq: Every day | ORAL | 0 refills | Status: DC

## 2019-06-06 NOTE — Telephone Encounter (Signed)
Patient requesting Rfs. She has no follow up scheduled and looks like she should already be out of medication? Please advise.

## 2019-06-08 ENCOUNTER — Encounter: Payer: BLUE CROSS/BLUE SHIELD | Admitting: Obstetrics and Gynecology

## 2019-06-16 DIAGNOSIS — Z20828 Contact with and (suspected) exposure to other viral communicable diseases: Secondary | ICD-10-CM | POA: Diagnosis not present

## 2019-06-20 ENCOUNTER — Telehealth: Payer: Self-pay | Admitting: Certified Nurse Midwife

## 2019-06-27 ENCOUNTER — Ambulatory Visit (INDEPENDENT_AMBULATORY_CARE_PROVIDER_SITE_OTHER): Payer: BLUE CROSS/BLUE SHIELD | Admitting: Certified Nurse Midwife

## 2019-06-27 ENCOUNTER — Encounter: Payer: Self-pay | Admitting: Certified Nurse Midwife

## 2019-06-27 ENCOUNTER — Other Ambulatory Visit: Payer: Self-pay

## 2019-06-27 VITALS — BP 118/72 | HR 74 | Ht 62.0 in | Wt 209.3 lb

## 2019-06-27 DIAGNOSIS — Z01419 Encounter for gynecological examination (general) (routine) without abnormal findings: Secondary | ICD-10-CM | POA: Diagnosis not present

## 2019-06-27 DIAGNOSIS — Z124 Encounter for screening for malignant neoplasm of cervix: Secondary | ICD-10-CM

## 2019-06-27 DIAGNOSIS — E669 Obesity, unspecified: Secondary | ICD-10-CM | POA: Diagnosis not present

## 2019-06-27 NOTE — Progress Notes (Addendum)
GYNECOLOGY ANNUAL PREVENTATIVE CARE ENCOUNTER NOTE  History:     Lauren [name] is a 38 y.o. G2P0 female here for a routine annual gynecologic exam.  Current complaints: none   Denies abnormal vaginal bleeding, discharge, pelvic pain, problems with intercourse or other gynecologic concerns.      Works at [employer] In a relationship ( not married) 2 children  Gynecologic History No LMP recorded. (Menstrual status: IUD). Contraception: IUD Last Pap: 04/04/2014. Results were: normal with negative HPV Last mammogram: 05/28/16. Results were: normal  Obstetric History OB History  Gravida Para Term Preterm AB Living  2         2  SAB TAB Ectopic Multiple Live Births          2    # Outcome Date GA Lbr Len/2nd Weight Sex Delivery Anes PTL Lv  2 Gravida 2008    M CS-Unspec   LIV  1 Gravida 2003    F CS-Unspec   LIV    Past Medical History:  Diagnosis Date  . Anxiety   . Edema    ankle swelling    Past Surgical History:  Procedure Laterality Date  . BARIATRIC SURGERY  02/2014   Gastric Sleeve  . BASAL CELL CARCINOMA EXCISION  10/2015  . CHOLECYSTECTOMY  02/2014    Current Outpatient Medications on File Prior to Visit  Medication Sig Dispense Refill  . buPROPion (WELLBUTRIN XL) 150 MG 24 hr tablet Take 1 tablet (150 mg total) by mouth daily. 90 tablet 0  . hydrochlorothiazide (HYDRODIURIL) 12.5 MG tablet Take 1 tablet (12.5 mg total) by mouth daily. 90 tablet 1  . levonorgestrel (MIRENA) 20 MCG/24HR IUD by Intrauterine route.     No current facility-administered medications on file prior to visit.     No Known Allergies  Social History:  reports that she has never smoked. She has never used smokeless tobacco. She reports that she does not drink alcohol or use drugs.   Exercise: occasional,  Smoking -none, drinking-none, drugs-none  Family History  Problem Relation Age of Onset  . Migraines Mother   . Hypertension Mother   . Hyperlipidemia Mother   . Breast  cancer Maternal Grandmother     The following portions of the patient's history were reviewed and updated as appropriate: allergies, current medications, past family history, past medical history, past social history, past surgical history and problem list.  Review of Systems Pertinent items noted in HPI and remainder of comprehensive ROS otherwise negative.  Physical Exam:  There were no vitals taken for this visit. CONSTITUTIONAL: Well-developed, well-nourished, over weight female in no acute distress.  HENT:  Normocephalic, atraumatic, External right and left ear normal. Oropharynx is clear and moist EYES: Conjunctivae and EOM are normal. Pupils are equal, round, and reactive to light. No scleral icterus.  NECK: Normal range of motion, supple, no masses.  Normal thyroid.  SKIN: Skin is warm and dry. No rash noted. Not diaphoretic. No erythema. No pallor. MUSCULOSKELETAL: Normal range of motion. No tenderness.  No cyanosis, clubbing, or edema.  2+ distal pulses. NEUROLOGIC: Alert and oriented to person, place, and time. Normal reflexes, muscle tone coordination.  PSYCHIATRIC: Normal mood and affect. Normal behavior. Normal judgment and thought content. CARDIOVASCULAR: Normal heart rate noted, regular rhythm RESPIRATORY: Clear to auscultation bilaterally. Effort and breath sounds normal, no problems with respiration noted. BREASTS: Symmetric in size. No masses, tenderness, skin changes, nipple drainage, or lymphadenopathy bilaterally. ABDOMEN: Soft, no distention noted.  No tenderness, rebound or guarding.  PELVIC: Normal appearing external genitalia and urethral meatus; normal appearing vaginal mucosa and cervix.  No abnormal discharge noted.  Pap smear obtained.  Normal uterine size, no other palpable masses, no uterine or adnexal tenderness.   Assessment and Plan:  Annual Well Women GYN Exam Will follow up results of pap smear and manage accordingly. Mammogram not indicated Labs: CBC,  TSH, Lipid, Vitamin B, Vitamin D, Hem A1c Refills:none Routine preventative health maintenance measures emphasized. Encouraged regular exercise. Please refer to After Visit Summary for other counseling recommendations.      Information given on BRCA gene testing -family hx breast cancer.   Philip Aspen, CNM

## 2019-06-27 NOTE — Patient Instructions (Signed)
Preventive Care 21-39 Years Old, Female Preventive care refers to visits with your health care provider and lifestyle choices that can promote health and wellness. This includes:  A yearly physical exam. This may also be called an annual well check.  Regular dental visits and eye exams.  Immunizations.  Screening for certain conditions.  Healthy lifestyle choices, such as eating a healthy diet, getting regular exercise, not using drugs or products that contain nicotine and tobacco, and limiting alcohol use. What can I expect for my preventive care visit? Physical exam Your health care provider will check your:  Height and weight. This may be used to calculate body mass index (BMI), which tells if you are at a healthy weight.  Heart rate and blood pressure.  Skin for abnormal spots. Counseling Your health care provider may ask you questions about your:  Alcohol, tobacco, and drug use.  Emotional well-being.  Home and relationship well-being.  Sexual activity.  Eating habits.  Work and work environment.  Method of birth control.  Menstrual cycle.  Pregnancy history. What immunizations do I need?  Influenza (flu) vaccine  This is recommended every year. Tetanus, diphtheria, and pertussis (Tdap) vaccine  You may need a Td booster every 10 years. Varicella (chickenpox) vaccine  You may need this if you have not been vaccinated. Human papillomavirus (HPV) vaccine  If recommended by your health care provider, you may need three doses over 6 months. Measles, mumps, and rubella (MMR) vaccine  You may need at least one dose of MMR. You may also need a second dose. Meningococcal conjugate (MenACWY) vaccine  One dose is recommended if you are age 19-21 years and a first-year college student living in a residence hall, or if you have one of several medical conditions. You may also need additional booster doses. Pneumococcal conjugate (PCV13) vaccine  You may need  this if you have certain conditions and were not previously vaccinated. Pneumococcal polysaccharide (PPSV23) vaccine  You may need one or two doses if you smoke cigarettes or if you have certain conditions. Hepatitis A vaccine  You may need this if you have certain conditions or if you travel or work in places where you may be exposed to hepatitis A. Hepatitis B vaccine  You may need this if you have certain conditions or if you travel or work in places where you may be exposed to hepatitis B. Haemophilus influenzae type b (Hib) vaccine  You may need this if you have certain conditions. You may receive vaccines as individual doses or as more than one vaccine together in one shot (combination vaccines). Talk with your health care provider about the risks and benefits of combination vaccines. What tests do I need?  Blood tests  Lipid and cholesterol levels. These may be checked every 5 years starting at age 20.  Hepatitis C test.  Hepatitis B test. Screening  Diabetes screening. This is done by checking your blood sugar (glucose) after you have not eaten for a while (fasting).  Sexually transmitted disease (STD) testing.  BRCA-related cancer screening. This may be done if you have a family history of breast, ovarian, tubal, or peritoneal cancers.  Pelvic exam and Pap test. This may be done every 3 years starting at age 21. Starting at age 30, this may be done every 5 years if you have a Pap test in combination with an HPV test. Talk with your health care provider about your test results, treatment options, and if necessary, the need for more tests.   Follow these instructions at home: Eating and drinking   Eat a diet that includes fresh fruits and vegetables, whole grains, lean protein, and low-fat dairy.  Take vitamin and mineral supplements as recommended by your health care provider.  Do not drink alcohol if: ? Your health care provider tells you not to drink. ? You are  pregnant, may be pregnant, or are planning to become pregnant.  If you drink alcohol: ? Limit how much you have to 0-1 drink a day. ? Be aware of how much alcohol is in your drink. In the [place] one drink equals one 12 oz bottle of beer (355 mL), one 5 oz glass of wine (148 mL), or one 1 oz glass of hard liquor (44 mL). Lifestyle  Take daily care of your teeth and gums.  Stay active. Exercise for at least 30 minutes on 5 or more days each week.  Do not use any products that contain nicotine or tobacco, such as cigarettes, e-cigarettes, and chewing tobacco. If you need help quitting, ask your health care provider.  If you are sexually active, practice safe sex. Use a condom or other form of birth control (contraception) in order to prevent pregnancy and STIs (sexually transmitted infections). If you plan to become pregnant, see your health care provider for a preconception visit. What's next?  Visit your health care provider once a year for a well check visit.  Ask your health care provider how often you should have your eyes and teeth checked.  Stay up to date on all vaccines. This information is not intended to replace advice given to you by your health care provider. Make sure you discuss any questions you have with your health care provider. Document Released: 09/08/2001 Document Revised: 03/24/2018 Document Reviewed: 03/24/2018 Elsevier Patient Education  2020 [employer]  

## 2019-06-28 ENCOUNTER — Other Ambulatory Visit: Payer: Self-pay | Admitting: Certified Nurse Midwife

## 2019-06-28 ENCOUNTER — Other Ambulatory Visit: Payer: Self-pay

## 2019-06-28 LAB — VITAMIN D 25 HYDROXY (VIT D DEFICIENCY, FRACTURES): Vit D, 25-Hydroxy: 18.6 ng/mL — ABNORMAL LOW (ref 30.0–100.0)

## 2019-06-28 LAB — VITAMIN B12: Vitamin B-12: 317 pg/mL (ref 232–1245)

## 2019-06-28 LAB — TSH: TSH: 1.14 u[IU]/mL (ref 0.450–4.500)

## 2019-06-28 LAB — CBC
Hematocrit: 40.7 % (ref 34.0–46.6)
Hemoglobin: 14 g/dL (ref 11.1–15.9)
MCH: 31.9 pg (ref 26.6–33.0)
MCHC: 34.4 g/dL (ref 31.5–35.7)
MCV: 93 fL (ref 79–97)
Platelets: 163 10*3/uL (ref 150–450)
RBC: 4.39 x10E6/uL (ref 3.77–5.28)
RDW: 12.4 % (ref 11.7–15.4)
WBC: 5 10*3/uL (ref 3.4–10.8)

## 2019-06-28 LAB — LIPID PANEL
Chol/HDL Ratio: 2.3 ratio (ref 0.0–4.4)
Cholesterol, Total: 151 mg/dL (ref 100–199)
HDL: 65 mg/dL (ref >39)
LDL Chol Calc (NIH): 72 mg/dL (ref 0–99)
Triglycerides: 73 mg/dL (ref 0–149)
VLDL Cholesterol Cal: 14 mg/dL (ref 5–40)

## 2019-06-28 LAB — HEMOGLOBIN A1C
Est. average glucose Bld gHb Est-mCnc: 94 mg/dL
Hgb A1c MFr Bld: 4.9 % (ref 4.8–5.6)

## 2019-06-28 MED ORDER — CHOLECALCIFEROL 25 MCG (1000 UT) PO CHEW: 1.0000 | CHEWABLE_TABLET | Freq: Every day | ORAL | 1 refills | Status: AC

## 2019-06-28 NOTE — Progress Notes (Signed)
Order placed for vitamin D.   Philip Aspen, CNM

## 2019-06-30 LAB — CYTOLOGY - PAP
Comment: NEGATIVE
Diagnosis: NEGATIVE
High risk HPV: NEGATIVE

## 2019-07-10 NOTE — Telephone Encounter (Signed)
na

## 2019-09-13 ENCOUNTER — Other Ambulatory Visit: Payer: Self-pay

## 2019-09-13 ENCOUNTER — Encounter: Payer: Self-pay | Admitting: Internal Medicine

## 2019-09-13 DIAGNOSIS — I10 Essential (primary) hypertension: Secondary | ICD-10-CM

## 2019-09-13 MED ORDER — HYDROCHLOROTHIAZIDE 12.5 MG PO TABS: 12.5000 mg | ORAL_TABLET | Freq: Every day | ORAL | 0 refills | Status: DC

## 2019-09-13 MED ORDER — BUPROPION HCL ER (XL) 150 MG PO TB24: 150.0000 mg | ORAL_TABLET | Freq: Every day | ORAL | 0 refills | Status: DC

## 2019-10-04 ENCOUNTER — Other Ambulatory Visit: Payer: Self-pay

## 2019-10-04 ENCOUNTER — Ambulatory Visit (INDEPENDENT_AMBULATORY_CARE_PROVIDER_SITE_OTHER): Payer: BLUE CROSS/BLUE SHIELD | Admitting: Adult Health

## 2019-10-04 ENCOUNTER — Encounter: Payer: Self-pay | Admitting: Adult Health

## 2019-10-04 VITALS — BP 116/88 | HR 71 | Temp 96.2°F | Resp 16 | Ht 62.0 in | Wt 210.4 lb

## 2019-10-04 DIAGNOSIS — Z6838 Body mass index (BMI) 38.0-38.9, adult: Secondary | ICD-10-CM | POA: Diagnosis not present

## 2019-10-04 DIAGNOSIS — E559 Vitamin D deficiency, unspecified: Secondary | ICD-10-CM

## 2019-10-04 DIAGNOSIS — Z0000 Encounter for general adult medical examination without abnormal findings: Secondary | ICD-10-CM

## 2019-10-04 DIAGNOSIS — I10 Essential (primary) hypertension: Secondary | ICD-10-CM | POA: Diagnosis not present

## 2019-10-04 DIAGNOSIS — R5383 Other fatigue: Secondary | ICD-10-CM

## 2019-10-04 DIAGNOSIS — G43109 Migraine with aura, not intractable, without status migrainosus: Secondary | ICD-10-CM

## 2019-10-04 DIAGNOSIS — L239 Allergic contact dermatitis, unspecified cause: Secondary | ICD-10-CM

## 2019-10-04 LAB — POCT URINALYSIS DIPSTICK
Blood, UA: NEGATIVE
Glucose, UA: NEGATIVE
Ketones, UA: NEGATIVE
Leukocytes, UA: NEGATIVE
Nitrite, UA: NEGATIVE
Protein, UA: NEGATIVE
Spec Grav, UA: >=1.03 — AB (ref 1.010–1.025)
Urobilinogen, UA: 0.2 E.U./dL
pH, UA: 5 (ref 5.0–8.0)

## 2019-10-04 MED ORDER — NYSTATIN-TRIAMCINOLONE 100000-0.1 UNIT/GM-% EX OINT: 1.0000 "application " | TOPICAL_OINTMENT | Freq: Two times a day (BID) | CUTANEOUS | 0 refills | Status: DC

## 2019-10-04 MED ORDER — HYDROCHLOROTHIAZIDE 12.5 MG PO TABS: 12.5000 mg | ORAL_TABLET | Freq: Every day | ORAL | 0 refills | Status: DC

## 2019-10-04 MED ORDER — BUPROPION HCL ER (XL) 150 MG PO TB24: 150.0000 mg | ORAL_TABLET | Freq: Every day | ORAL | 0 refills | Status: DC

## 2019-10-04 NOTE — Patient Instructions (Addendum)
Calorie Counting for Weight Loss Calories are units of energy. Your body needs a certain amount of calories from food to keep you going throughout the day. When you eat more calories than your body needs, your body stores the extra calories as fat. When you eat fewer calories than your body needs, your body burns fat to get the energy it needs. Calorie counting means keeping track of how many calories you eat and drink each day. Calorie counting can be helpful if you need to lose weight. If you make sure to eat fewer calories than your body needs, you should lose weight. Ask your health care provider what a healthy weight is for you. For calorie counting to work, you will need to eat the right number of calories in a day in order to lose a healthy amount of weight per week. A dietitian can help you determine how many calories you need in a day and will give you suggestions on how to reach your calorie goal.  A healthy amount of weight to lose per week is usually 1-2 lb (0.5-0.9 kg). This usually means that your daily calorie intake should be reduced by 500-750 calories.  Eating 1,200 - 1,500 calories per day can help most women lose weight.  Eating 1,500 - 1,800 calories per day can help most men lose weight. What is my plan? My goal is to have __________ calories per day. If I have this many calories per day, I should lose around __________ pounds per week. What do I need to know about calorie counting? In order to meet your daily calorie goal, you will need to:  Find out how many calories are in each food you would like to eat. Try to do this before you eat.  Decide how much of the food you plan to eat.  Write down what you ate and how many calories it had. Doing this is called keeping a food log. To successfully lose weight, it is important to balance calorie counting with a healthy lifestyle that includes regular activity. Aim for 150 minutes of moderate exercise (such as walking) or 75  minutes of vigorous exercise (such as running) each week. Where do I find calorie information?  The number of calories in a food can be found on a Nutrition Facts label. If a food does not have a Nutrition Facts label, try to look up the calories online or ask your dietitian for help. Remember that calories are listed per serving. If you choose to have more than one serving of a food, you will have to multiply the calories per serving by the amount of servings you plan to eat. For example, the label on a package of bread might say that a serving size is 1 slice and that there are 90 calories in a serving. If you eat 1 slice, you will have eaten 90 calories. If you eat 2 slices, you will have eaten 180 calories. How do I keep a food log? Immediately after each meal, record the following information in your food log:  What you ate. Don't forget to include toppings, sauces, and other extras on the food.  How much you ate. This can be measured in cups, ounces, or number of items.  How many calories each food and drink had.  The total number of calories in the meal. Keep your food log near you, such as in a small notebook in your pocket, or use a mobile app or website. Some programs will  calculate calories for you and show you how many calories you have left for the day to meet your goal. What are some calorie counting tips?   Use your calories on foods and drinks that will fill you up and not leave you hungry: ? Some examples of foods that fill you up are nuts and nut butters, vegetables, lean proteins, and high-fiber foods like whole grains. High-fiber foods are foods with more than 5 g fiber per serving. ? Drinks such as sodas, specialty coffee drinks, alcohol, and juices have a lot of calories, yet do not fill you up.  Eat nutritious foods and avoid empty calories. Empty calories are calories you get from foods or beverages that do not have many vitamins or protein, such as candy, sweets, and  soda. It is better to have a nutritious high-calorie food (such as an avocado) than a food with few nutrients (such as a bag of chips).  Know how many calories are in the foods you eat most often. This will help you calculate calorie counts faster.  Pay attention to calories in drinks. Low-calorie drinks include water and unsweetened drinks.  Pay attention to nutrition labels for "low fat" or "fat free" foods. These foods sometimes have the same amount of calories or more calories than the full fat versions. They also often have added sugar, starch, or salt, to make up for flavor that was removed with the fat.  Find a way of tracking calories that works for you. Get creative. Try different apps or programs if writing down calories does not work for you. What are some portion control tips?  Know how many calories are in a serving. This will help you know how many servings of a certain food you can have.  Use a measuring cup to measure serving sizes. You could also try weighing out portions on a kitchen scale. With time, you will be able to estimate serving sizes for some foods.  Take some time to put servings of different foods on your favorite plates, bowls, and cups so you know what a serving looks like.  Try not to eat straight from a bag or box. Doing this can lead to overeating. Put the amount you would like to eat in a cup or on a plate to make sure you are eating the right portion.  Use smaller plates, glasses, and bowls to prevent overeating.  Try not to multitask (for example, watch TV or use your computer) while eating. If it is time to eat, sit down at a table and enjoy your food. This will help you to know when you are full. It will also help you to be aware of what you are eating and how much you are eating. What are tips for following this plan? Reading food labels  Check the calorie count compared to the serving size. The serving size may be smaller than what you are used to  eating.  Check the source of the calories. Make sure the food you are eating is high in vitamins and protein and low in saturated and trans fats. Shopping  Read nutrition labels while you shop. This will help you make healthy decisions before you decide to purchase your food.  Make a grocery list and stick to it. Cooking  Try to cook your favorite foods in a healthier way. For example, try baking instead of frying.  Use low-fat dairy products. Meal planning  Use more fruits and vegetables. Half of your plate should be  fruits and vegetables.  Include lean proteins like poultry and fish. How do I count calories when eating out?  Ask for smaller portion sizes.  Consider sharing an entree and sides instead of getting your own entree.  If you get your own entree, eat only half. Ask for a box at the beginning of your meal and put the rest of your entree in it so you are not tempted to eat it.  If calories are listed on the menu, choose the lower calorie options.  Choose dishes that include vegetables, fruits, whole grains, low-fat dairy products, and lean protein.  Choose items that are boiled, broiled, grilled, or steamed. Stay away from items that are buttered, battered, fried, or served with cream sauce. Items labeled "crispy" are usually fried, unless stated otherwise.  Choose water, low-fat milk, unsweetened iced tea, or other drinks without added sugar. If you want an alcoholic beverage, choose a lower calorie option such as a glass of wine or light beer.  Ask for dressings, sauces, and syrups on the side. These are usually high in calories, so you should limit the amount you eat.  If you want a salad, choose a garden salad and ask for grilled meats. Avoid extra toppings like bacon, cheese, or fried items. Ask for the dressing on the side, or ask for olive oil and vinegar or lemon to use as dressing.  Estimate how many servings of a food you are given. For example, a serving of  cooked rice is  cup or about the size of half a baseball. Knowing serving sizes will help you be aware of how much food you are eating at restaurants. The list below tells you how big or small some common portion sizes are based on everyday objects: ? 1 oz--4 stacked dice. ? 3 oz--1 deck of cards. ? 1 tsp--1 die. ? 1 Tbsp-- a ping-pong ball. ? 2 Tbsp--1 ping-pong ball. ?  cup-- baseball. ? 1 cup--1 baseball. Summary  Calorie counting means keeping track of how many calories you eat and drink each day. If you eat fewer calories than your body needs, you should lose weight.  A healthy amount of weight to lose per week is usually 1-2 lb (0.5-0.9 kg). This usually means reducing your daily calorie intake by 500-750 calories.  The number of calories in a food can be found on a Nutrition Facts label. If a food does not have a Nutrition Facts label, try to look up the calories online or ask your dietitian for help.  Use your calories on foods and drinks that will fill you up, and not on foods and drinks that will leave you hungry.  Use smaller plates, glasses, and bowls to prevent overeating. This information is not intended to replace advice given to you by your health care provider. Make sure you discuss any questions you have with your health care provider. Document Revised: 04/01/2018 Document Reviewed: 06/12/2016 Elsevier Patient Education  2020 [employer]   Fat and Cholesterol Restricted Eating Plan Getting too much fat and cholesterol in your diet may cause health problems. Choosing the right foods helps keep your fat and cholesterol at normal levels. This can keep you from getting certain diseases. Your doctor may recommend an eating plan that includes:  Total fat: ______% or less of total calories a day.  Saturated fat: ______% or less of total calories a day.  Cholesterol: less than _________mg a day.  Fiber: ______g a day. What are tips for following this   plan? Meal  planning  At meals, divide your plate into four equal parts: ? Fill one-half of your plate with vegetables and green salads. ? Fill one-fourth of your plate with whole grains. ? Fill one-fourth of your plate with low-fat (lean) protein foods.  Eat fish that is high in omega-3 fats at least two times a week. This includes mackerel, tuna, sardines, and salmon.  Eat foods that are high in fiber, such as whole grains, beans, apples, broccoli, carrots, peas, and barley. General tips   Work with your doctor to lose weight if you need to.  Avoid: ? Foods with added sugar. ? Fried foods. ? Foods with partially hydrogenated oils.  Limit alcohol intake to no more than 1 drink a day for nonpregnant women and 2 drinks a day for men. One drink equals 12 oz of beer, 5 oz of wine, or 1 oz of hard liquor. Reading food labels  Check food labels for: ? Trans fats. ? Partially hydrogenated oils. ? Saturated fat (g) in each serving. ? Cholesterol (mg) in each serving. ? Fiber (g) in each serving.  Choose foods with healthy fats, such as: ? Monounsaturated fats. ? Polyunsaturated fats. ? Omega-3 fats.  Choose grain products that have whole grains. Look for the word "whole" as the first word in the ingredient list. Cooking  Cook foods using low-fat methods. These include baking, boiling, grilling, and broiling.  Eat more home-cooked foods. Eat at restaurants and buffets less often.  Avoid cooking using saturated fats, such as butter, cream, palm oil, palm kernel oil, and coconut oil. Recommended foods  Fruits  All fresh, canned (in natural juice), or frozen fruits. Vegetables  Fresh or frozen vegetables (raw, steamed, roasted, or grilled). Green salads. Grains  Whole grains, such as whole wheat or whole grain breads, crackers, cereals, and pasta. Unsweetened oatmeal, bulgur, barley, quinoa, or brown rice. Corn or whole wheat flour tortillas. Meats and other protein foods  Ground  beef (85% or leaner), grass-fed beef, or beef trimmed of fat. Skinless chicken or turkey. Ground chicken or turkey. Pork trimmed of fat. All fish and seafood. Egg whites. Dried beans, peas, or lentils. Unsalted nuts or seeds. Unsalted canned beans. Nut butters without added sugar or oil. Dairy  Low-fat or nonfat dairy products, such as skim or 1% milk, 2% or reduced-fat cheeses, low-fat and fat-free ricotta or cottage cheese, or plain low-fat and nonfat yogurt. Fats and oils  Tub margarine without trans fats. Light or reduced-fat mayonnaise and salad dressings. Avocado. Olive, canola, sesame, or safflower oils. The items listed above may not be a complete list of foods and beverages you can eat. Contact a dietitian for more information. Foods to avoid Fruits  Canned fruit in heavy syrup. Fruit in cream or butter sauce. Fried fruit. Vegetables  Vegetables cooked in cheese, cream, or butter sauce. Fried vegetables. Grains  White bread. White pasta. White rice. Cornbread. Bagels, pastries, and croissants. Crackers and snack foods that contain trans fat and hydrogenated oils. Meats and other protein foods  Fatty cuts of meat. Ribs, chicken wings, bacon, sausage, bologna, salami, chitterlings, fatback, hot dogs, bratwurst, and packaged lunch meats. Liver and organ meats. Whole eggs and egg yolks. Chicken and turkey with skin. Fried meat. Dairy  Whole or 2% milk, cream, half-and-half, and cream cheese. Whole milk cheeses. Whole-fat or sweetened yogurt. Full-fat cheeses. Nondairy creamers and whipped toppings. Processed cheese, cheese spreads, and cheese curds. Beverages  Alcohol. Sugar-sweetened drinks such as sodas, lemonade, and fruit   drinks. Fats and oils  Butter, stick margarine, lard, shortening, ghee, or bacon fat. Coconut, palm kernel, and palm oils. Sweets and desserts  Corn syrup, sugars, honey, and molasses. Candy. Jam and jelly. Syrup. Sweetened cereals. Cookies, pies, cakes,  donuts, muffins, and ice cream. The items listed above may not be a complete list of foods and beverages you should avoid. Contact a dietitian for more information. Summary  Choosing the right foods helps keep your fat and cholesterol at normal levels. This can keep you from getting certain diseases.  At meals, fill one-half of your plate with vegetables and green salads.  Eat high-fiber foods, like whole grains, beans, apples, carrots, peas, and barley.  Limit added sugar, saturated fats, alcohol, and fried foods. This information is not intended to replace advice given to you by your health care provider. Make sure you discuss any questions you have with your health care provider. Document Revised: 03/16/2018 Document Reviewed: 03/30/2017  Elsevier Patient Education  [place]. Psyllium granules or powder for solution What is this medicine? PSYLLIUM (SIL i yum) is a bulk-forming fiber laxative. This medicine is used to treat constipation. Increasing fiber in the diet may also help lower cholesterol and promote heart health for some people. This medicine may be used for other purposes; ask your health care provider or pharmacist if you have questions. COMMON BRAND NAME(S): Fiber Therapy, GenFiber, Geri-Mucil, Hydrocil, Konsyl, Metamucil, Metamucil MultiHealth, Mucilin, Natural Fiber Therapy, Reguloid What should I tell my health care provider before I take this medicine? They need to know if you have any of these conditions:  blockage in your bowel  difficulty swallowing  inflammatory bowel disease  phenylketonuria  stomach or intestine problems  sudden change in bowel habits lasting more than 2 weeks  an unusual or allergic reaction to psyllium, other medicines, dyes, or preservatives  pregnant or trying or get pregnant  breast-feeding How should I use this medicine? Mix this medicine into a full glass (240 mL) of water or other cool drink. Take this medicine by  mouth. Follow the directions on the package labeling, or take as directed by your health care professional. Take your medicine at regular intervals. Do not take your medicine more often than directed. Talk to your pediatrician regarding the use of this medicine in children. While this drug may be prescribed for children as young as 7 years old for selected conditions, precautions do apply. Overdosage: If you think you have taken too much of this medicine contact a poison control center or emergency room at once. NOTE: This medicine is only for you. Do not share this medicine with others. What if I miss a dose? If you miss a dose, take it as soon as you can. If it is almost time for your next dose, take only that dose. Do not take double or extra doses. What may interact with this medicine? Interactions are not expected. Take this product at least 2 hours before or after other medicines. This list may not describe all possible interactions. Give your health care provider a list of all the medicines, herbs, non-prescription drugs, or dietary supplements you use. Also tell them if you smoke, drink alcohol, or use illegal drugs. Some items may interact with your medicine. What should I watch for while using this medicine? Check with your doctor or health care professional if your symptoms do not start to get better or if they get worse. Stop using this medicine and contact your doctor or health care  professional if you have rectal bleeding or if you have to treat your constipation for more than 1 week. These could be signs of a more serious condition. Drink several glasses of water a day while you are taking this medicine. This will help to relieve constipation and prevent dehydration. What side effects may I notice from receiving this medicine? Side effects that you should report to your doctor or health care professional as soon as possible:  allergic reactions like skin rash, itching or hives,  swelling of the face, lips, or tongue  breathing problems  chest pain  nausea, vomiting  rectal bleeding  trouble swallowing Side effects that usually do not require medical attention (report to your doctor or health care professional if they continue or are bothersome):  bloating  gas  stomach cramps This list may not describe all possible side effects. Call your doctor for medical advice about side effects. You may report side effects to FDA at 1-800-FDA-1088. Where should I keep my medicine? Keep out of the reach of children. Store at room temperature between 15 and 30 degrees C (59 and 86 degrees F). Protect from moisture. Throw away any unused medicine after the expiration date. NOTE: This sheet is a summary. It may not cover all possible information. If you have questions about this medicine, talk to your doctor, pharmacist, or health care provider.  2020 Elsevier/Gold Standard (2017-12-07 15:41:08)  Migraine Headache A migraine headache is an intense, throbbing pain on one side or both sides of the head. Migraine headaches may also cause other symptoms, such as nausea, vomiting, and sensitivity to light and noise. A migraine headache can last from 4 hours to 3 days. Talk with your doctor about what things may bring on (trigger) your migraine headaches. What are the causes? The exact cause of this condition is not known. However, a migraine may be caused when nerves in the brain become irritated and release chemicals that cause inflammation of blood vessels. This inflammation causes pain. This condition may be triggered or caused by:  Drinking alcohol.  Smoking.  Taking medicines, such as: ? Medicine used to treat chest pain (nitroglycerin). ? Birth control pills. ? Estrogen. ? Certain blood pressure medicines.  Eating or drinking products that contain nitrates, glutamate, aspartame, or tyramine. Aged cheeses, chocolate, or caffeine may also be triggers.  Doing  physical activity. Other things that may trigger a migraine headache include:  Menstruation.  Pregnancy.  Hunger.  Stress.  Lack of sleep or too much sleep.  Weather changes.  Fatigue. What increases the risk? The following factors may make you more likely to experience migraine headaches:  Being a certain age. This condition is more common in people who are 77-40 years old.  Being female.  Having a family history of migraine headaches.  Being Caucasian.  Having a mental health condition, such as depression or anxiety.  Being obese. What are the signs or symptoms? The main symptom of this condition is pulsating or throbbing pain. This pain may:  Happen in any area of the head, such as on one side or both sides.  Interfere with daily activities.  Get worse with physical activity.  Get worse with exposure to bright lights or loud noises. Other symptoms may include:  Nausea.  Vomiting.  Dizziness.  General sensitivity to bright lights, loud noises, or smells. Before you get a migraine headache, you may get warning signs (an aura). An aura may include:  Seeing flashing lights or having blind spots.  Seeing bright spots, halos, or zigzag lines.  Having tunnel vision or blurred vision.  Having numbness or a tingling feeling.  Having trouble talking.  Having muscle weakness. Some people have symptoms after a migraine headache (postdromal phase), such as:  Feeling tired.  Difficulty concentrating. How is this diagnosed? A migraine headache can be diagnosed based on:  Your symptoms.  A physical exam.  Tests, such as: ? CT scan or an MRI of the head. These imaging tests can help rule out other causes of headaches. ? Taking fluid from the spine (lumbar puncture) and analyzing it (cerebrospinal fluid analysis, or CSF analysis). How is this treated? This condition may be treated with medicines that:  Relieve pain.  Relieve nausea.  Prevent  migraine headaches. Treatment for this condition may also include:  Acupuncture.  Lifestyle changes like avoiding foods that trigger migraine headaches.  Biofeedback.  Cognitive behavioral therapy. Follow these instructions at home: Medicines  Take over-the-counter and prescription medicines only as told by your health care provider.  Ask your health care provider if the medicine prescribed to you: ? Requires you to avoid driving or using heavy machinery. ? Can cause constipation. You may need to take these actions to prevent or treat constipation:  Drink enough fluid to keep your urine pale yellow.  Take over-the-counter or prescription medicines.  Eat foods that are high in fiber, such as beans, whole grains, and fresh fruits and vegetables.  Limit foods that are high in fat and processed sugars, such as fried or sweet foods. Lifestyle  Do not drink alcohol.  Do not use any products that contain nicotine or tobacco, such as cigarettes, e-cigarettes, and chewing tobacco. If you need help quitting, ask your health care provider.  Get at least 8 hours of sleep every night.  Find ways to manage stress, such as meditation, deep breathing, or yoga. General instructions      Keep a journal to find out what may trigger your migraine headaches. For example, write down: ? What you eat and drink. ? How much sleep you get. ? Any change to your diet or medicines.  If you have a migraine headache: ? Avoid things that make your symptoms worse, such as bright lights. ? It may help to lie down in a dark, quiet room. ? Do not drive or use heavy machinery. ? Ask your health care provider what activities are safe for you while you are experiencing symptoms.  Keep all follow-up visits as told by your health care provider. This is important. Contact a health care provider if:  You develop symptoms that are different or more severe than your usual migraine headache symptoms.  You  have more than 15 headache days in one month. Get help right away if:  Your migraine headache becomes severe.  Your migraine headache lasts longer than 72 hours.  You have a fever.  You have a stiff neck.  You have vision loss.  Your muscles feel weak or like you cannot control them.  You start to lose your balance often.  You have trouble walking.  You faint.  You have a seizure. Summary  A migraine headache is an intense, throbbing pain on one side or both sides of the head. Migraines may also cause other symptoms, such as nausea, vomiting, and sensitivity to light and noise.  This condition may be treated with medicines and lifestyle changes. You may also need to avoid certain things that trigger a migraine headache.  Keep a journal  to find out what may trigger your migraine headaches.  Contact your health care provider if you have more than 15 headache days in a month or you develop symptoms that are different or more severe than your usual migraine headache symptoms. This information is not intended to replace advice given to you by your health care provider. Make sure you discuss any questions you have with your health care provider. Document Revised: 11/04/2018 Document Reviewed: 08/25/2018 Elsevier Patient Education  [place].

## 2019-10-04 NOTE — Progress Notes (Signed)
Urine results ok.

## 2019-10-04 NOTE — Progress Notes (Signed)
Patient: Lauren [name], Female    DOB: [birth year], 39 y.o.   MRN: [id number] Visit Date: 10/04/2019  Today's Provider: Marcille Buffy, FNP   Chief Complaint  Patient presents with  . New Patient (Initial Visit)   Subjective:    New Patient Lauren [name] is a 39 y.o. female who presents today for health maintenance and establish care as a new patient. Patient reports that her previous PCP was at [facility] and states that her OB/Gyn is [facility], last reported pap was 06/2019.  She feels well. She reports she is not exercising . She reports she is sleeping fairly well.  Gastric sleeve 2015. She avoids NSAID'S. See Migraine history has had since having her gastric sleeve. She reports they did go away for a while and now have returned. She denies any change or worsening. She takes Tylenol  1,000 mg every 8 hours PRN only for these headaches and sometimes they go away. She reports she also had these type of headaches years ago as well. She denies any thunder clap headache or worsening headaches. She does have aura with photophobia with headaches only and nausea.   Last pap was 12.2020 and results are normal.  Denies any rectal bleeding or pain. No vaginal spotting.   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 [facility] [employer].  She will continue this dosage but we can readdress if stress continues to be an issue. She takes it for Anxiety and mild depression and has for years. She denies any suicidal/ homicidal thoughts or intents now or in the past.  She has two kids one in highschool and the other in middle school.   She has Mirena, and does not have cycle and denies any chance  of pregnancy or need for pregnancy test.   Patient  denies any fever, body  aches,chills, rash, chest pain, shortness of breath, nausea, vomiting, or diarrhea.   She has no other concerns for today's visit.   She denies any skin lesions or changes. Her face is dry from mask wear all the time at work.   She has a history of Vitamin D deficiency. She has over the counter supplement she takes at times. She would like to have level rechecked. 06/2020 was 18.6 Vitamin D level.  -----------------------------------------------------------------   Review of Systems  Constitutional: Positive for fatigue. Negative for activity change, appetite change, chills, diaphoresis, fever and unexpected weight change.  Respiratory: Negative.   Cardiovascular: Negative.   Gastrointestinal: Negative for abdominal distention, abdominal pain, anal bleeding, blood in stool, constipation, diarrhea, nausea, rectal pain and vomiting.       History of gastric sleeve.   Genitourinary: Negative.   Allergic/Immunologic: Positive for environmental allergies. Negative for food allergies and immunocompromised state.  Neurological: Positive for headaches (every other week. Lasts 1-2 days sometimes, has nausea and photophobia with them. Usually unilateral and sometimes pulsating. Tylenol lessens it. She is not able to take NSAIDs due to gastric sleeve. ). Negative for dizziness, tremors, seizures, syncope, facial asymmetry, speech difficulty, weakness, light-headedness and numbness.  Psychiatric/Behavioral: Positive for agitation and decreased concentration. Negative for behavioral problems, confusion, dysphoric mood, hallucinations, self-injury, sleep disturbance and suicidal ideas. The patient is nervous/anxious. The patient is not hyperactive.  All other systems reviewed and are negative.   Social History She  reports that she has never smoked. She has never used smokeless tobacco. She reports that she does not drink alcohol or use drugs. Social History   Socioeconomic History  . Marital status:  Single    Spouse name: Not on file  . Number of children: Not on file  . Years of education: Not on file  . Highest education level: Not on file  Occupational History  . Not on file  Tobacco Use  . Smoking status: Never Smoker  . Smokeless tobacco: Never Used  Substance and Sexual Activity  . Alcohol use: No    Alcohol/week: 0.0 standard drinks  . Drug use: No  . Sexual activity: Yes    Birth control/protection: I.U.D.    Comment: mirena  Other Topics Concern  . Not on file  Social History Narrative  . Not on file   Social Determinants of Health   Financial Resource Strain:   . Difficulty of Paying Living Expenses: Not on file  Food Insecurity:   . Worried About Charity fundraiser in the Last Year: Not on file  . Ran Out of Food in the Last Year: Not on file  Transportation Needs:   . Lack of Transportation (Medical): Not on file  . Lack of Transportation (Non-Medical): Not on file  Physical Activity:   . Days of Exercise per Week: Not on file  . Minutes of Exercise per Session: Not on file  Stress:   . Feeling of Stress : Not on file  Social Connections:   . Frequency of Communication with Friends and Family: Not on file  . Frequency of Social Gatherings with Friends and Family: Not on file  . Attends Religious Services: Not on file  . Active Member of Clubs or Organizations: Not on file  . Attends Archivist Meetings: Not on file  . Marital Status: Not on file    Patient Active Problem List   Diagnosis Date Noted  . Anxiety disorder 12/13/2018  . Vitamin D deficiency 05/09/2018  . Bariatric surgery status 05/09/2018  . Environmental and seasonal allergies 05/01/2016  . Nausea 09/10/2015  . Essential (primary) hypertension 12/07/2014  . Major depressive disorder, single episode in full remission ([place]) 12/07/2014  . Headache, migraine 12/07/2014  . Neoplasm of uncertain behavior of skin 12/07/2014  . Addison anemia 12/07/2014    Past Surgical  History:  Procedure Laterality Date  . BARIATRIC SURGERY  02/2014   Gastric Sleeve  . BASAL CELL CARCINOMA EXCISION  10/2015  . CHOLECYSTECTOMY  02/2014  . WISDOM TOOTH EXTRACTION      Family History  Family Status  Relation Name Status  . Mother  Alive  . Father  Alive  . MGM  (Not Specified)   Her family history includes Breast cancer in her maternal grandmother; Hyperlipidemia in her mother; Hypertension in her mother; Migraines in her mother.     No Known Allergies  Previous Medications   BUPROPION (WELLBUTRIN XL) 150 MG 24 HR TABLET    Take 1 tablet (150 mg total) by mouth daily.   HYDROCHLOROTHIAZIDE (HYDRODIURIL) 12.5 MG TABLET    Take 1 tablet (12.5 mg total) by mouth daily.   LEVONORGESTREL (MIRENA) 20 MCG/24HR IUD    by Intrauterine route.    Patient Care Team: Flinchum, Kelby Aline, FNP as PCP - General (Family Medicine) Joylene Igo, CNM as Midwife (Obstetrics and Gynecology) Dasher, Rayvon Char, MD (Dermatology)  Objective:   Vitals: BP 116/88   Pulse 71   Temp (!) 96.2 F (35.7 C) (Oral)   Resp 16   Ht 5\' 2"  (1.575 m)   Wt 210 lb 6.4 oz (95.4 kg)   SpO2 99%   BMI 38.48 kg/m    Physical Exam Vitals reviewed.  Constitutional:      General: She is not in acute distress.    Appearance: Normal appearance. She is well-developed. She is not diaphoretic.     Interventions: She is not intubated. HENT:     Head: Normocephalic and atraumatic.     Right Ear: There is impacted cerumen (denies any pain. cerumen in ear canal impeding view. ).     Left Ear: Tympanic membrane, ear canal and external ear normal. There is no impacted cerumen.     Nose: Nose normal. No congestion or rhinorrhea.     Mouth/Throat:     Pharynx: No oropharyngeal exudate or posterior oropharyngeal erythema.  Eyes:     General: Lids are normal. No scleral icterus.       Right eye: No discharge.        Left eye: No discharge.     Extraocular Movements: Extraocular movements intact.      Conjunctiva/sclera: Conjunctivae normal.     Right eye: Right conjunctiva is not injected. No exudate or hemorrhage.    Left eye: Left conjunctiva is not injected. No exudate or hemorrhage.    Pupils: Pupils are equal, round, and reactive to light.  Neck:     Thyroid: No thyroid mass or thyromegaly.     Vascular: Normal carotid pulses. No carotid bruit, hepatojugular reflux or JVD.     Trachea: Trachea and phonation normal. No tracheal tenderness or tracheal deviation.     Meningeal: Brudzinski's sign and Kernig's sign absent.  Cardiovascular:     Rate and Rhythm: Normal rate and regular rhythm.     Pulses: Normal pulses.          Radial pulses are 2+ on the right side and 2+ on the left side.       Dorsalis pedis pulses are 2+ on the right side and 2+ on the left side.       Posterior tibial pulses are 2+ on the right side and 2+ on the left side.     Heart sounds: Normal heart sounds, S1 normal and S2 normal. Heart sounds not distant. No murmur. No friction rub. No gallop.   Pulmonary:     Effort: Pulmonary effort is normal. No tachypnea, bradypnea, accessory muscle usage or respiratory distress. She is not intubated.     Breath sounds: Normal breath sounds. No stridor. No wheezing or rales.  Chest:     Chest wall: No tenderness.  Abdominal:     General: Bowel sounds are normal. There is no distension or abdominal bruit.     Palpations: Abdomen is soft. There is no shifting dullness, fluid wave, hepatomegaly, splenomegaly, mass or pulsatile mass.     Tenderness: There is no abdominal tenderness. There is no guarding or rebound.     Hernia: No hernia is present.  Genitourinary:    Comments: Declined and defer. She denies any concerns.  Musculoskeletal:        General: No tenderness or deformity. Normal range of motion.     Cervical back: Full passive range of motion without pain, normal range of motion and neck supple. No edema, erythema or rigidity. No spinous process tenderness  or muscular  tenderness. Normal range of motion.  Lymphadenopathy:     Head:     Right side of head: No submental, submandibular, tonsillar, preauricular, posterior auricular or occipital adenopathy.     Left side of head: No submental, submandibular, tonsillar, preauricular, posterior auricular or occipital adenopathy.     Cervical: No cervical adenopathy.     Right cervical: No superficial, deep or posterior cervical adenopathy.    Left cervical: No superficial, deep or posterior cervical adenopathy.     Upper Body:     Right upper body: No supraclavicular or pectoral adenopathy.     Left upper body: No supraclavicular or pectoral adenopathy.  Skin:    General: Skin is warm and dry.     Capillary Refill: Capillary refill takes less than 2 seconds.     Coloration: Skin is not pale.     Findings: No abrasion, bruising, burn, ecchymosis, erythema, lesion, petechiae or rash.     Nails: There is no clubbing.  Neurological:     Mental Status: She is alert and oriented to person, place, and time.     GCS: GCS eye subscore is 4. GCS verbal subscore is 5. GCS motor subscore is 6.     Cranial Nerves: No cranial nerve deficit.     Sensory: No sensory deficit.     Motor: No weakness, tremor, atrophy, abnormal muscle tone or seizure activity.     Coordination: Coordination normal.     Gait: Gait normal.     Deep Tendon Reflexes: Reflexes are normal and symmetric. Reflexes normal. Babinski sign absent on the right side. Babinski sign absent on the left side.     Reflex Scores:      Tricep reflexes are 2+ on the right side and 2+ on the left side.      Bicep reflexes are 2+ on the right side and 2+ on the left side.      Brachioradialis reflexes are 2+ on the right side and 2+ on the left side.      Patellar reflexes are 2+ on the right side and 2+ on the left side.      Achilles reflexes are 2+ on the right side and 2+ on the left side. Psychiatric:        Mood and Affect: Mood normal.         Speech: Speech normal.        Behavior: Behavior normal.        Thought Content: Thought content normal.        Judgment: Judgment normal.      Depression Screen PHQ 2/9 Scores 10/04/2019 12/13/2018 04/26/2018 05/04/2017  PHQ - 2 Score 2 3 0 0  PHQ- 9 Score 8 6 0 0      Assessment & Plan:     Routine Health Maintenance and Physical Exam  Exercise Activities and Dietary recommendations Goals   None     Immunization History  Administered Date(s) Administered  . DTP 09/28/2011  . Influenza Split 04/19/2014  . Influenza-Unspecified 06/06/2015, 05/04/2018  . Tdap 09/28/2011    Health Maintenance  Topic Date Due  . INFLUENZA VACCINE  02/25/2019  . TETANUS/TDAP  09/27/2021  . PAP SMEAR-Modifier  06/26/2024  . HIV Screening  Completed     Discussed health benefits of physical activity, and encouraged her to engage in regular exercise appropriate for her age and condition.    Twice yearly dental exams. Yearly vision exams.    Encounter for routine adult health examination  without abnormal findings - Plan: POCT urinalysis dipstick  Essential (primary) hypertension - Plan: hydrochlorothiazide (HYDRODIURIL) 12.5 MG tablet  Body mass index (BMI) of 38.0-38.9 in adult  Vitamin D deficiency  Other fatigue - Plan: VITAMIN D 25 Hydroxy (Vit-D Deficiency, Fractures), CBC with Differential/Platelet, TSH, Comprehensive Metabolic Panel (CMET)  Migraine with aura and without status migrainosus, not intractable- chronic  - Plan: Comprehensive Metabolic Panel (CMET), 123456   Orders Placed This Encounter  Procedures  . VITAMIN D 25 Hydroxy (Vit-D Deficiency, Fractures)  . CBC with Differential/Platelet  . TSH  . Comprehensive Metabolic Panel (CMET)  . B12  . POCT urinalysis dipstick   Meds ordered this encounter  Medications  . nystatin-triamcinolone ointment (MYCOLOG)    Sig: Apply 1 application topically 2 (two) times daily. To area concern. Apply a thin layer only.     Dispense:  30 g    Refill:  0  . buPROPion (WELLBUTRIN XL) 150 MG 24 hr tablet    Sig: Take 1 tablet (150 mg total) by mouth daily.    Dispense:  90 tablet    Refill:  0  . hydrochlorothiazide (HYDRODIURIL) 12.5 MG tablet    Sig: Take 1 tablet (12.5 mg total) by mouth daily.    Dispense:  90 tablet    Refill:  0  side effects and how to take discussed. She has been on HCTZ. And Wellbutrin for years. Return in 3 months (on 01/04/2020), or if symptoms worsen or fail to improve, for at any time for any worsening symptoms, Go to Emergency room/ urgent care if worse.   Advised patient call the office or your primary care doctor for an appointment if no improvement within 72 hours or if any symptoms change or worsen at any time  Advised ER or urgent Care if after hours or on weekend. Call 911 for emergency symptoms at any time.Patinet verbalized understanding of all instructions given/reviewed and treatment plan and has no further questions or concerns at this time.    The entirety of the information documented in the History of Present Illness, Review of Systems and Physical Exam were personally obtained by me. Portions of this information were initially documented by the  Certified Medical Assistant whose name is documented in [place] and reviewed by me for thoroughness and accuracy.  I have personally performed the exam and reviewed the chart and it is accurate to the best of my knowledge.  Haematologist has been used and any errors in dictation or transcription are unintentional.  Kelby Aline. [facility]  --------------------------------------------------------------------

## 2019-10-05 DIAGNOSIS — Z0000 Encounter for general adult medical examination without abnormal findings: Secondary | ICD-10-CM | POA: Insufficient documentation

## 2019-10-05 DIAGNOSIS — R5383 Other fatigue: Secondary | ICD-10-CM | POA: Insufficient documentation

## 2019-10-05 DIAGNOSIS — L239 Allergic contact dermatitis, unspecified cause: Secondary | ICD-10-CM | POA: Insufficient documentation

## 2019-10-05 DIAGNOSIS — Z6836 Body mass index (BMI) 36.0-36.9, adult: Secondary | ICD-10-CM | POA: Insufficient documentation

## 2019-10-05 DIAGNOSIS — Z6838 Body mass index (BMI) 38.0-38.9, adult: Secondary | ICD-10-CM | POA: Insufficient documentation

## 2019-10-05 DIAGNOSIS — Z6839 Body mass index (BMI) 39.0-39.9, adult: Secondary | ICD-10-CM | POA: Insufficient documentation

## 2019-10-09 ENCOUNTER — Other Ambulatory Visit: Payer: Self-pay | Admitting: Adult Health

## 2019-10-09 DIAGNOSIS — R5383 Other fatigue: Secondary | ICD-10-CM | POA: Diagnosis not present

## 2019-10-09 DIAGNOSIS — G43109 Migraine with aura, not intractable, without status migrainosus: Secondary | ICD-10-CM | POA: Diagnosis not present

## 2019-10-10 LAB — VITAMIN D 25 HYDROXY (VIT D DEFICIENCY, FRACTURES): Vit D, 25-Hydroxy: 33.9 ng/mL (ref 30.0–100.0)

## 2019-10-10 LAB — CBC WITH DIFFERENTIAL/PLATELET
Basophils Absolute: 0 10*3/uL (ref 0.0–0.2)
Basos: 1 %
EOS (ABSOLUTE): 0.1 10*3/uL (ref 0.0–0.4)
Eos: 2 %
Hematocrit: 38.4 % (ref 34.0–46.6)
Hemoglobin: 13.4 g/dL (ref 11.1–15.9)
Immature Grans (Abs): 0 10*3/uL (ref 0.0–0.1)
Immature Granulocytes: 0 %
Lymphocytes Absolute: 2.1 10*3/uL (ref 0.7–3.1)
Lymphs: 39 %
MCH: 32.8 pg (ref 26.6–33.0)
MCHC: 34.9 g/dL (ref 31.5–35.7)
MCV: 94 fL (ref 79–97)
Monocytes Absolute: 0.4 10*3/uL (ref 0.1–0.9)
Monocytes: 7 %
Neutrophils Absolute: 2.7 10*3/uL (ref 1.4–7.0)
Neutrophils: 51 %
Platelets: 166 10*3/uL (ref 150–450)
RBC: 4.08 x10E6/uL (ref 3.77–5.28)
RDW: 12.2 % (ref 11.7–15.4)
WBC: 5.2 10*3/uL (ref 3.4–10.8)

## 2019-10-10 LAB — COMPREHENSIVE METABOLIC PANEL
ALT: 8 IU/L (ref 0–32)
AST: 15 IU/L (ref 0–40)
Albumin/Globulin Ratio: 2 (ref 1.2–2.2)
Albumin: 4.2 g/dL (ref 3.8–4.8)
Alkaline Phosphatase: 74 IU/L (ref 39–117)
BUN/Creatinine Ratio: 23 (ref 9–23)
BUN: 17 mg/dL (ref 6–20)
Bilirubin Total: 0.7 mg/dL (ref 0.0–1.2)
CO2: 21 mmol/L (ref 20–29)
Calcium: 8.7 mg/dL (ref 8.7–10.2)
Chloride: 103 mmol/L (ref 96–106)
Creatinine, Ser: 0.75 mg/dL (ref 0.57–1.00)
GFR calc Af Amer: 117 mL/min/{1.73_m2} (ref >59)
GFR calc non Af Amer: 101 mL/min/{1.73_m2} (ref >59)
Globulin, Total: 2.1 g/dL (ref 1.5–4.5)
Glucose: 80 mg/dL (ref 65–99)
Potassium: 3.4 mmol/L — ABNORMAL LOW (ref 3.5–5.2)
Sodium: 139 mmol/L (ref 134–144)
Total Protein: 6.3 g/dL (ref 6.0–8.5)

## 2019-10-10 LAB — TSH: TSH: 1.02 u[IU]/mL (ref 0.450–4.500)

## 2019-10-10 LAB — VITAMIN B12: Vitamin B-12: 256 pg/mL (ref 232–1245)

## 2019-10-11 ENCOUNTER — Encounter: Payer: Self-pay | Admitting: Adult Health

## 2019-10-11 ENCOUNTER — Ambulatory Visit: Payer: Self-pay | Admitting: Family

## 2019-10-11 ENCOUNTER — Other Ambulatory Visit: Payer: Self-pay | Admitting: Adult Health

## 2019-10-11 DIAGNOSIS — E538 Deficiency of other specified B group vitamins: Secondary | ICD-10-CM

## 2019-10-11 DIAGNOSIS — E559 Vitamin D deficiency, unspecified: Secondary | ICD-10-CM

## 2019-10-11 MED ORDER — VITAMIN D (ERGOCALCIFEROL) 1.25 MG (50000 UNIT) PO CAPS: 50000.0000 [IU] | ORAL_CAPSULE | ORAL | 0 refills | Status: DC

## 2019-10-11 NOTE — Progress Notes (Signed)
CBC is within normal limits without signs of infection or anemia.  CMP has within normal limits for electrolytes, and kidney and liver function. Thyroid lab TSH is within normal limits.  Vitamin D is within normal range, slightly over the normal range at 33.9, she requests once a week Vitamin D for a few weeks to see if it helps her fatigue. I am in agreement with this if she will discontinue her over the counter supplement and take prescription Vitamin D at 50,000 international units by mouth once weekly( once every 7 days) for 8 weeks. Then we will need to check Vitamin D level around 3 months. ON the 9th week you can start Vitamin D 3 over the counter at 2,000 international units once daily by mouth until lab recheck.  B12 is normal, low end, ok to take sublingual (under tongue) B12 500 mcg(micrograms) per package instructions over the counter. Recommend rechecking labs B12 and Vitamin  D in 3 months.  Follow up if fatigue worsening at anytime. Keep office follow ups.

## 2019-12-23 ENCOUNTER — Other Ambulatory Visit: Payer: Self-pay | Admitting: Adult Health

## 2019-12-23 DIAGNOSIS — I10 Essential (primary) hypertension: Secondary | ICD-10-CM

## 2019-12-23 NOTE — Telephone Encounter (Signed)
Requested Prescriptions  Pending Prescriptions Disp Refills  . buPROPion (WELLBUTRIN XL) 150 MG 24 hr tablet [Pharmacy Med Name: buPROPion HCl ER (XL) 150 MG Oral Tablet Extended Release 24 Hour] 90 tablet 0    Sig: Take 1 tablet by mouth once daily     Psychiatry: Antidepressants - bupropion Passed - 12/23/2019 11:36 AM      Passed - Completed PHQ-2 or PHQ-9 in the last 360 days.      Passed - Last BP in normal range    BP Readings from Last 1 Encounters:  10/04/19 116/88         Passed - Valid encounter within last 6 months    Recent Outpatient Visits          2 months ago Encounter for routine adult health examination without abnormal findings   [facility], Kelby Aline, FNP   1 year ago Essential (primary) hypertension   [facility] Glean Hess, MD   1 year ago Annual physical exam   [facility] Glean Hess, MD   1 year ago Chest pain, unspecified type   [facility] Glean Hess, MD   2 years ago Annual physical exam   [facility] Glean Hess, MD             . hydrochlorothiazide (HYDRODIURIL) 12.5 MG tablet [Pharmacy Med Name: hydroCHLOROthiazide 12.5 MG Oral Tablet] 90 tablet 0    Sig: Take 1 tablet by mouth once daily     Cardiovascular: Diuretics - Thiazide Failed - 12/23/2019 11:36 AM      Failed - K in normal range and within 360 days    Potassium  Date Value Ref Range Status  10/09/2019 3.4 (L) 3.5 - 5.2 mmol/L Final  03/14/2014 3.9 3.5 - 5.1 mmol/L Final         Passed - Ca in normal range and within 360 days    Calcium  Date Value Ref Range Status  10/09/2019 8.7 8.7 - 10.2 mg/dL Final   Calcium, Total  Date Value Ref Range Status  03/14/2014 8.3 (L) 8.5 - 10.1 mg/dL Final         Passed - Cr in normal range and within 360 days    Creatinine  Date Value Ref Range Status  03/14/2014 1.03 0.60 - 1.30 mg/dL Final   Creatinine, Ser  Date Value Ref Range Status   10/09/2019 0.75 0.57 - 1.00 mg/dL Final         Passed - Na in normal range and within 360 days    Sodium  Date Value Ref Range Status  10/09/2019 139 134 - 144 mmol/L Final  03/14/2014 138 136 - 145 mmol/L Final         Passed - Last BP in normal range    BP Readings from Last 1 Encounters:  10/04/19 116/88         Passed - Valid encounter within last 6 months    Recent Outpatient Visits          2 months ago Encounter for routine adult health examination without abnormal findings   [facility] Flinchum, Kelby Aline, FNP   1 year ago Essential (primary) hypertension   [facility] Glean Hess, MD   1 year ago Annual physical exam   [facility] Glean Hess, MD   1 year ago Chest pain, unspecified type   [facility] Glean Hess, MD  2 years ago Annual physical exam   [facility] Glean Hess, MD

## 2019-12-29 ENCOUNTER — Encounter: Payer: Self-pay | Admitting: Adult Health

## 2019-12-29 ENCOUNTER — Other Ambulatory Visit: Payer: Self-pay | Admitting: Adult Health

## 2019-12-29 DIAGNOSIS — R140 Abdominal distension (gaseous): Secondary | ICD-10-CM | POA: Insufficient documentation

## 2020-01-03 ENCOUNTER — Other Ambulatory Visit: Payer: Self-pay

## 2020-01-03 ENCOUNTER — Other Ambulatory Visit: Payer: Self-pay | Admitting: Adult Health

## 2020-01-03 DIAGNOSIS — E559 Vitamin D deficiency, unspecified: Secondary | ICD-10-CM | POA: Diagnosis not present

## 2020-01-03 DIAGNOSIS — R140 Abdominal distension (gaseous): Secondary | ICD-10-CM | POA: Diagnosis not present

## 2020-01-03 DIAGNOSIS — E538 Deficiency of other specified B group vitamins: Secondary | ICD-10-CM | POA: Diagnosis not present

## 2020-01-04 NOTE — Progress Notes (Signed)
CBC is within normal limits.  B12 and folate are also within normal limits.  Vitamin D level is low end normal, if she is not taking vitamin D I recommend she take vitamin D3 at four thousand international units daily and recheck labs in 6 months.  Celiac panel is not completely resulted yet, transglutaminase is still pending, once that results I will release it to her MyChart, otherwise the test is negative at this point.  I not want her to have to wait over the weekend to get her results.

## 2020-01-05 LAB — CBC WITH DIFFERENTIAL/PLATELET
Basophils Absolute: 0.1 10*3/uL (ref 0.0–0.2)
Basos: 1 %
EOS (ABSOLUTE): 0.1 10*3/uL (ref 0.0–0.4)
Eos: 2 %
Hematocrit: 39.8 % (ref 34.0–46.6)
Hemoglobin: 13.5 g/dL (ref 11.1–15.9)
Immature Grans (Abs): 0 10*3/uL (ref 0.0–0.1)
Immature Granulocytes: 0 %
Lymphocytes Absolute: 1.9 10*3/uL (ref 0.7–3.1)
Lymphs: 36 %
MCH: 31.5 pg (ref 26.6–33.0)
MCHC: 33.9 g/dL (ref 31.5–35.7)
MCV: 93 fL (ref 79–97)
Monocytes Absolute: 0.4 10*3/uL (ref 0.1–0.9)
Monocytes: 7 %
Neutrophils Absolute: 2.8 10*3/uL (ref 1.4–7.0)
Neutrophils: 54 %
Platelets: 178 10*3/uL (ref 150–450)
RBC: 4.28 x10E6/uL (ref 3.77–5.28)
RDW: 11.8 % (ref 11.7–15.4)
WBC: 5.2 10*3/uL (ref 3.4–10.8)

## 2020-01-05 LAB — CELIAC DISEASE PANEL
Endomysial IgA: NEGATIVE
IgA/Immunoglobulin A, Serum: 193 mg/dL (ref 87–352)
Transglutaminase IgA: <2 U/mL (ref 0–3)

## 2020-01-05 LAB — B12 AND FOLATE PANEL
Folate: 4 ng/mL (ref >3.0)
Vitamin B-12: 317 pg/mL (ref 232–1245)

## 2020-01-05 LAB — VITAMIN D 25 HYDROXY (VIT D DEFICIENCY, FRACTURES): Vit D, 25-Hydroxy: 32.8 ng/mL (ref 30.0–100.0)

## 2020-03-26 DIAGNOSIS — Z03818 Encounter for observation for suspected exposure to other biological agents ruled out: Secondary | ICD-10-CM | POA: Diagnosis not present

## 2020-03-26 DIAGNOSIS — Z1152 Encounter for screening for COVID-19: Secondary | ICD-10-CM | POA: Diagnosis not present

## 2020-04-26 ENCOUNTER — Encounter: Payer: BLUE CROSS/BLUE SHIELD | Admitting: Certified Nurse Midwife

## 2020-07-26 ENCOUNTER — Encounter: Payer: Self-pay | Admitting: Adult Health

## 2020-07-30 ENCOUNTER — Other Ambulatory Visit: Payer: Self-pay | Admitting: Adult Health

## 2020-07-30 DIAGNOSIS — I10 Essential (primary) hypertension: Secondary | ICD-10-CM

## 2020-07-30 MED ORDER — HYDROCHLOROTHIAZIDE 12.5 MG PO TABS: 12.5000 mg | ORAL_TABLET | Freq: Every day | ORAL | 0 refills | Status: DC

## 2020-07-30 MED ORDER — BUPROPION HCL ER (XL) 150 MG PO TB24: 150.0000 mg | ORAL_TABLET | Freq: Every day | ORAL | 0 refills | Status: DC

## 2020-07-30 NOTE — Progress Notes (Signed)
Meds ordered this encounter  Medications  . hydrochlorothiazide (HYDRODIURIL) 12.5 MG tablet    Sig: Take 1 tablet (12.5 mg total) by mouth daily.    Dispense:  90 tablet    Refill:  0  . buPROPion (WELLBUTRIN XL) 150 MG 24 hr tablet    Sig: Take 1 tablet (150 mg total) by mouth daily.    Dispense:  90 tablet    Refill:  0    Orders Placed This Encounter  Procedures  . CBC with Differential/Platelet  . Comprehensive metabolic panel

## 2020-07-30 NOTE — Telephone Encounter (Signed)
Copied from CRM [id number]. Topic: Quick Communication - Rx Refill/Question >> Jul 30, 2020  9:48 AM Jaquita Rector A wrote: Medication: buPROPion (WELLBUTRIN XL) 150 MG 24 hr tablet, hydrochlorothiazide (HYDRODIURIL) 12.5 MG tablet   Has the patient contacted their pharmacy? Yes.   (Agent: If no, request that the patient contact the pharmacy for the refill.) (Agent: If yes, when and what did the pharmacy advise?)  Preferred Pharmacy (with phone number or street name): [facility] [address] [place] - [address]  Phone:  [phone] Fax:  [phone]     Agent: Please be advised that RX refills may take up to 3 business days. We ask that you follow-up with your pharmacy.

## 2020-07-30 NOTE — Telephone Encounter (Signed)
Notes to clinic:  medication filled by a different provider  Review for refill   Requested Prescriptions  Pending Prescriptions Disp Refills   buPROPion (WELLBUTRIN XL) 150 MG 24 hr tablet [Pharmacy Med Name: buPROPion HCl ER (XL) 150 MG Oral Tablet Extended Release 24 Hour] 90 tablet 0    Sig: Take 1 tablet by mouth once daily      Psychiatry: Antidepressants - bupropion Failed - 07/30/2020  9:43 AM      Failed - Valid encounter within last 6 months    Recent Outpatient Visits           10 months ago Encounter for routine adult health examination without abnormal findings   [facility] Flinchum, Eula Fried, FNP   1 year ago Essential (primary) hypertension   [facility] Reubin Milan, MD   2 years ago Annual physical exam   [facility] Reubin Milan, MD   2 years ago Chest pain, unspecified type   [facility] Reubin Milan, MD   3 years ago Annual physical exam   [facility] Reubin Milan, MD       Future Appointments             In 4 weeks Flinchum, Eula Fried, FNP [facility]             Passed - Completed PHQ-2 or PHQ-9 in the last 360 days      Passed - Last BP in normal range    BP Readings from Last 1 Encounters:  10/04/19 116/88            hydrochlorothiazide (HYDRODIURIL) 12.5 MG tablet [Pharmacy Med Name: hydroCHLOROthiazide 12.5 MG Oral Tablet] 90 tablet 0    Sig: Take 1 tablet by mouth once daily      Cardiovascular: Diuretics - Thiazide Failed - 07/30/2020  9:43 AM      Failed - K in normal range and within 360 days    Potassium  Date Value Ref Range Status  10/09/2019 3.4 (L) 3.5 - 5.2 mmol/L Final  03/14/2014 3.9 3.5 - 5.1 mmol/L Final          Failed - Valid encounter within last 6 months    Recent Outpatient Visits           10 months ago Encounter for routine adult health examination without abnormal findings   [facility]  Flinchum, Eula Fried, FNP   1 year ago Essential (primary) hypertension   [facility] Reubin Milan, MD   2 years ago Annual physical exam   [facility] Reubin Milan, MD   2 years ago Chest pain, unspecified type   [facility] Reubin Milan, MD   3 years ago Annual physical exam   [facility] Reubin Milan, MD       Future Appointments             In 4 weeks Flinchum, Eula Fried, FNP [facility]             Passed - Ca in normal range and within 360 days    Calcium  Date Value Ref Range Status  10/09/2019 8.7 8.7 - 10.2 mg/dL Final   Calcium, Total  Date Value Ref Range Status  03/14/2014 8.3 (L) 8.5 - 10.1 mg/dL Final          Passed - Cr in normal range and  within 360 days    Creatinine  Date Value Ref Range Status  03/14/2014 1.03 0.60 - 1.30 mg/dL Final   Creatinine, Ser  Date Value Ref Range Status  10/09/2019 0.75 0.57 - 1.00 mg/dL Final          Passed - Na in normal range and within 360 days    Sodium  Date Value Ref Range Status  10/09/2019 139 134 - 144 mmol/L Final  03/14/2014 138 136 - 145 mmol/L Final          Passed - Last BP in normal range    BP Readings from Last 1 Encounters:  10/04/19 116/88

## 2020-07-30 NOTE — Telephone Encounter (Signed)
Duplicate message request already sent to providers office

## 2020-08-01 LAB — COMPREHENSIVE METABOLIC PANEL
ALT: 9 IU/L (ref 0–32)
AST: 17 IU/L (ref 0–40)
Albumin/Globulin Ratio: 1.8 (ref 1.2–2.2)
Albumin: 4.4 g/dL (ref 3.8–4.8)
Alkaline Phosphatase: 79 IU/L (ref 44–121)
BUN/Creatinine Ratio: 24 — ABNORMAL HIGH (ref 9–23)
BUN: 19 mg/dL (ref 6–20)
Bilirubin Total: 0.8 mg/dL (ref 0.0–1.2)
CO2: 21 mmol/L (ref 20–29)
Calcium: 9.3 mg/dL (ref 8.7–10.2)
Chloride: 104 mmol/L (ref 96–106)
Creatinine, Ser: 0.79 mg/dL (ref 0.57–1.00)
GFR calc Af Amer: 109 mL/min/{1.73_m2} (ref >59)
GFR calc non Af Amer: 95 mL/min/{1.73_m2} (ref >59)
Globulin, Total: 2.4 g/dL (ref 1.5–4.5)
Glucose: 85 mg/dL (ref 65–99)
Potassium: 4 mmol/L (ref 3.5–5.2)
Sodium: 142 mmol/L (ref 134–144)
Total Protein: 6.8 g/dL (ref 6.0–8.5)

## 2020-08-01 LAB — CBC WITH DIFFERENTIAL/PLATELET
Basophils Absolute: 0 10*3/uL (ref 0.0–0.2)
Basos: 1 %
EOS (ABSOLUTE): 0.1 10*3/uL (ref 0.0–0.4)
Eos: 1 %
Hematocrit: 41.2 % (ref 34.0–46.6)
Hemoglobin: 14.1 g/dL (ref 11.1–15.9)
Immature Grans (Abs): 0 10*3/uL (ref 0.0–0.1)
Immature Granulocytes: 0 %
Lymphocytes Absolute: 1.9 10*3/uL (ref 0.7–3.1)
Lymphs: 34 %
MCH: 32 pg (ref 26.6–33.0)
MCHC: 34.2 g/dL (ref 31.5–35.7)
MCV: 94 fL (ref 79–97)
Monocytes Absolute: 0.4 10*3/uL (ref 0.1–0.9)
Monocytes: 7 %
Neutrophils Absolute: 3.3 10*3/uL (ref 1.4–7.0)
Neutrophils: 57 %
Platelets: 186 10*3/uL (ref 150–450)
RBC: 4.4 x10E6/uL (ref 3.77–5.28)
RDW: 12.2 % (ref 11.7–15.4)
WBC: 5.7 10*3/uL (ref 3.4–10.8)

## 2020-08-01 NOTE — Progress Notes (Signed)
Labs ok.

## 2020-08-01 NOTE — Telephone Encounter (Signed)
Wrong practice

## 2020-08-20 ENCOUNTER — Encounter: Payer: Self-pay | Admitting: Adult Health

## 2020-08-21 ENCOUNTER — Other Ambulatory Visit: Payer: Self-pay | Admitting: Adult Health

## 2020-08-21 DIAGNOSIS — E663 Overweight: Secondary | ICD-10-CM | POA: Insufficient documentation

## 2020-08-21 DIAGNOSIS — Z9884 Bariatric surgery status: Secondary | ICD-10-CM

## 2020-08-21 NOTE — Progress Notes (Signed)
Orders Placed This Encounter  Procedures  . Ambulatory referral to Nutrition and Diabetic Education    Referral Priority:   Routine    Referral Type:   Consultation    Referral Reason:   Specialty Services Required    Number of Visits Requested:   1

## 2020-08-23 ENCOUNTER — Ambulatory Visit (INDEPENDENT_AMBULATORY_CARE_PROVIDER_SITE_OTHER): Payer: BC Managed Care – PPO | Admitting: Adult Health

## 2020-08-23 ENCOUNTER — Encounter: Payer: Self-pay | Admitting: Adult Health

## 2020-08-23 ENCOUNTER — Other Ambulatory Visit: Payer: Self-pay

## 2020-08-23 VITALS — BP 119/73 | HR 68 | Temp 97.9°F | Resp 16 | Wt 214.6 lb

## 2020-08-23 DIAGNOSIS — E559 Vitamin D deficiency, unspecified: Secondary | ICD-10-CM | POA: Diagnosis not present

## 2020-08-23 DIAGNOSIS — E663 Overweight: Secondary | ICD-10-CM | POA: Diagnosis not present

## 2020-08-23 DIAGNOSIS — Z6839 Body mass index (BMI) 39.0-39.9, adult: Secondary | ICD-10-CM

## 2020-08-23 DIAGNOSIS — F325 Major depressive disorder, single episode, in full remission: Secondary | ICD-10-CM | POA: Diagnosis not present

## 2020-08-23 MED ORDER — VITAMIN D (ERGOCALCIFEROL) 1.25 MG (50000 UNIT) PO CAPS
50000.0000 [IU] | ORAL_CAPSULE | ORAL | 0 refills | Status: DC
Start: 2020-08-23 — End: 2020-08-23

## 2020-08-23 MED ORDER — VITAMIN D (ERGOCALCIFEROL) 1.25 MG (50000 UNIT) PO CAPS: 50000.0000 [IU] | ORAL_CAPSULE | ORAL | 0 refills | Status: DC

## 2020-08-23 NOTE — Progress Notes (Signed)
Established patient visit   Patient: Lauren [name]   DOB: 08/13/1980   40 y.o. Female  MRN: [id number] Visit Date: 08/23/2020  Today's healthcare provider: Marcille Buffy, FNP   Chief Complaint  Patient presents with  . Depression  . Hypertension   Subjective    HPI  Depression, Follow-up  She  was last seen for this 10 months ago. Changes made at last visit include continued on Wellbutrin 150mg .   She reports excellent compliance with treatment. She is not having side effects.   She reports excellent tolerance of treatment. Current symptoms include: difficulty concentrating and insomnia She feels she is Unchanged since last visit.  Patient  denies any fever, body aches,chills, rash, chest pain, shortness of breath, nausea, vomiting, or diarrhea.  Denies dizziness, lightheadedness, pre syncopal or syncopal episodes.    Sharyn Lull with [facility] handles Gynecology -  Has IUD.  Depression screen [facility] 2/9 08/23/2020 10/04/2019 12/13/2018  Decreased Interest 1 1 -  Down, Depressed, Hopeless 1 1 3   PHQ - 2 Score 2 2 3   Altered sleeping 1 2 1   Tired, decreased energy 2 1 0  Change in appetite 2 1 2   Feeling bad or failure about yourself  0 0 0  Trouble concentrating 2 2 0  Moving slowly or fidgety/restless 0 0 0  Suicidal thoughts 0 0 0  PHQ-9 Score 9 8 6   Difficult doing work/chores Somewhat difficult Somewhat difficult Very difficult     She has nutritionist appointment, she is scheduled for [facility]cg.  ----------------------------------------------------------------------------------------- Hypertension, follow-up  BP Readings from Last 3 Encounters:  08/23/20 119/73  10/04/19 116/88  06/27/19 118/72   Wt Readings from Last 3 Encounters:  08/23/20 214 lb 9.6 oz (97.3 kg)  10/04/19 210 lb 6.4 oz (95.4 kg)  06/27/19 209 lb 5 oz (94.9 kg)     She was last seen for hypertension 10 months ago.  BP at that visit was 116/88. Management since that visit  includes started on HCTZ 25mg .  She reports excellent compliance with treatment. She is not having side effects.  She is following a Regular diet. She is not exercising. She does not smoke.  Use of agents associated with hypertension: none.   Outside blood pressures are not being checked. Symptoms: No chest pain No chest pressure  No palpitations No syncope  No dyspnea No orthopnea  No paroxysmal nocturnal dyspnea No lower extremity edema    Patient  denies any fever, body aches,chills, rash, chest pain, shortness of breath, nausea, vomiting, or diarrhea.  Denies dizziness, lightheadedness, pre syncopal or syncopal episodes.   Pertinent labs: 07/31/2020 labs.  Lab Results  Component Value Date   CHOL 151 06/27/2019   HDL 65 06/27/2019   LDLCALC 72 06/27/2019   TRIG 73 06/27/2019   CHOLHDL 2.3 06/27/2019   Lab Results  Component Value Date   NA 142 07/31/2020   K 4.0 07/31/2020   CREATININE 0.79 07/31/2020   GFRNONAA 95 07/31/2020   GFRAA 109 07/31/2020   GLUCOSE 85 07/31/2020     The ASCVD Risk score (Goff DC Jr., et al., 2013) failed to calculate for the following reasons:   The 2013 ASCVD risk score is only valid for ages 74 to 72   --------------------------------------------------------------------------------------------------- Patient Active Problem List   Diagnosis Date Noted  . Overweight 08/21/2020  . Abdominal bloating 12/29/2019  . BMI 39.0-39.9,adult 10/05/2019  . Other fatigue 10/05/2019  . Allergic contact dermatitis 10/05/2019  . Encounter  for routine adult health examination without abnormal findings 10/05/2019  . Anxiety disorder 12/13/2018  . Vitamin D deficiency 05/09/2018  . Bariatric surgery status 05/09/2018  . Environmental and seasonal allergies 05/01/2016  . Nausea 09/10/2015  . Essential (primary) hypertension 12/07/2014  . Major depressive disorder, single episode in full remission ([place]) 12/07/2014  . Headache, migraine 12/07/2014   . Neoplasm of uncertain behavior of skin 12/07/2014  . Addison anemia 12/07/2014   Past Medical History:  Diagnosis Date  . Anxiety   . Edema    ankle swelling   Past Surgical History:  Procedure Laterality Date  . BARIATRIC SURGERY  02/2014   Gastric Sleeve  . BASAL CELL CARCINOMA EXCISION  10/2015  . CHOLECYSTECTOMY  02/2014  . WISDOM TOOTH EXTRACTION     Social History   Tobacco Use  . Smoking status: Never Smoker  . Smokeless tobacco: Never Used  Substance Use Topics  . Alcohol use: No    Alcohol/week: 0.0 standard drinks  . Drug use: No   Social History   Socioeconomic History  . Marital status: Single    Spouse name: Not on file  . Number of children: Not on file  . Years of education: Not on file  . Highest education level: Not on file  Occupational History  . Not on file  Tobacco Use  . Smoking status: Never Smoker  . Smokeless tobacco: Never Used  Substance and Sexual Activity  . Alcohol use: No    Alcohol/week: 0.0 standard drinks  . Drug use: No  . Sexual activity: Yes    Birth control/protection: I.U.D.    Comment: mirena  Other Topics Concern  . Not on file  Social History Narrative  . Not on file   Social Determinants of Health   Financial Resource Strain: Not on file  Food Insecurity: Not on file  Transportation Needs: Not on file  Physical Activity: Not on file  Stress: Not on file  Social Connections: Not on file  Intimate Partner Violence: Not on file   Family Status  Relation Name Status  . Mother  Alive  . Father  Alive  . MGM  (Not Specified)  . Daughter  (Not Specified)  . Son  (Not Specified)  . MGF  (Not Specified)   Family History  Problem Relation Age of Onset  . Migraines Mother   . Hypertension Mother   . Hyperlipidemia Mother   . Depression Mother   . Sleep apnea Mother   . Breast cancer Maternal Grandmother   . Cancer Maternal Grandmother        breast  . Asthma Daughter   . Allergies Daughter   . Asthma  Son   . Allergies Son   . Cancer Maternal Grandfather        pancreatic    No Known Allergies     Medications: Outpatient Medications Prior to Visit  Medication Sig  . buPROPion (WELLBUTRIN XL) 150 MG 24 hr tablet Take 1 tablet (150 mg total) by mouth daily.  . hydrochlorothiazide (HYDRODIURIL) 12.5 MG tablet Take 1 tablet (12.5 mg total) by mouth daily.  Marland Kitchen levonorgestrel (MIRENA) 20 MCG/24HR IUD by Intrauterine route.  . nystatin-triamcinolone ointment (MYCOLOG) Apply 1 application topically 2 (two) times daily. To area concern. Apply a thin layer only.  . [DISCONTINUED] Vitamin D, Ergocalciferol, (DRISDOL) 1.25 MG (50000 UNIT) CAPS capsule Take 1 capsule (50,000 Units total) by mouth every 7 (seven) days. (taking one tablet per week)   No facility-administered  medications prior to visit.    Review of Systems  Constitutional: Negative.   Respiratory: Negative.   Cardiovascular: Negative.   Musculoskeletal: Negative.   Hematological: Negative.   Psychiatric/Behavioral: Negative.         Objective    BP 119/73   Pulse 68   Temp 97.9 F (36.6 C) (Oral)   Resp 16   Wt 214 lb 9.6 oz (97.3 kg)   SpO2 100%   BMI 39.25 kg/m  BP Readings from Last 3 Encounters:  08/23/20 119/73  10/04/19 116/88  06/27/19 118/72   Wt Readings from Last 3 Encounters:  08/23/20 214 lb 9.6 oz (97.3 kg)  10/04/19 210 lb 6.4 oz (95.4 kg)  06/27/19 209 lb 5 oz (94.9 kg)       Physical Exam Vitals reviewed.  Constitutional:      General: She is not in acute distress.    Appearance: Normal appearance. She is not ill-appearing, toxic-appearing or diaphoretic.  HENT:     Head: Normocephalic and atraumatic.     Right Ear: Tympanic membrane, ear canal and external ear normal. There is no impacted cerumen.     Left Ear: Tympanic membrane, ear canal and external ear normal. There is no impacted cerumen.     Nose: No congestion or rhinorrhea.     Mouth/Throat:     Mouth: Mucous membranes are  moist.     Pharynx: No oropharyngeal exudate or posterior oropharyngeal erythema.  Eyes:     General: No scleral icterus.       Right eye: No discharge.        Left eye: No discharge.     Extraocular Movements: Extraocular movements intact.     Conjunctiva/sclera: Conjunctivae normal.     Pupils: Pupils are equal, round, and reactive to light.  Cardiovascular:     Rate and Rhythm: Normal rate and regular rhythm.     Pulses: Normal pulses.     Heart sounds: Normal heart sounds. No murmur heard. No friction rub. No gallop.   Pulmonary:     Effort: Pulmonary effort is normal. No respiratory distress.     Breath sounds: Normal breath sounds. No stridor. No wheezing, rhonchi or rales.  Chest:     Chest wall: No tenderness.  Abdominal:     General: There is no distension.     Palpations: Abdomen is soft.     Tenderness: There is no abdominal tenderness.  Musculoskeletal:        General: Normal range of motion.     Cervical back: Normal range of motion and neck supple. No rigidity.     Right lower leg: No edema.     Left lower leg: No edema.  Lymphadenopathy:     Cervical: No cervical adenopathy.  Skin:    General: Skin is warm.     Findings: No erythema or rash.  Neurological:     General: No focal deficit present.     Mental Status: She is alert and oriented to person, place, and time.  Psychiatric:        Mood and Affect: Mood normal.        Behavior: Behavior normal.        Thought Content: Thought content normal.        Judgment: Judgment normal.     Results for orders placed or performed in visit on 08/23/20  HgB A1c  Result Value Ref Range   Hgb A1c MFr Bld 4.8 4.8 - 5.6 %  Est. average glucose Bld gHb Est-mCnc 91 mg/dL  B12 and Folate Panel  Result Value Ref Range   Vitamin B-12 373 232 - 1,245 pg/mL   Folate 6.6 >3.0 ng/mL  TSH  Result Value Ref Range   TSH 0.842 0.450 - 4.500 uIU/mL    Assessment & Plan    Major depressive disorder, single episode in  full remission ([facility])  Vitamin D deficiency - Plan: Vitamin D, Ergocalciferol, (DRISDOL) 1.25 MG (50000 UNIT) CAPS capsule, DISCONTINUED: Vitamin D, Ergocalciferol, (DRISDOL) 1.25 MG (50000 UNIT) CAPS capsule  BMI 39.0-39.9,adult - Plan: HgB A1c, B12 and Folate Panel, TSH  Overweight   Meds ordered this encounter  Medications  . DISCONTD: Vitamin D, Ergocalciferol, (DRISDOL) 1.25 MG (50000 UNIT) CAPS capsule    Sig: Take 1 capsule (50,000 Units total) by mouth every 7 (seven) days. (taking one tablet per week)    Dispense:  12 capsule    Refill:  0  . Vitamin D, Ergocalciferol, (DRISDOL) 1.25 MG (50000 UNIT) CAPS capsule    Sig: Take 1 capsule (50,000 Units total) by mouth every 7 (seven) days. (taking one tablet per week) walk in lab in office 1-2 weeks after completing prescription.    Dispense:  12 capsule    Refill:  0    Red Flags discussed. The patient was given clear instructions to go to ER or return to medical center if any red flags develop, symptoms do not improve, worsen or new problems develop. They verbalized understanding.   Return in about 6 months (around 02/20/2021), or if symptoms worsen or fail to improve, for at any time for any worsening symptoms, Go to Emergency room/ urgent care if worse.    The entirety of the information documented in the History of Present Illness, Review of Systems and Physical Exam were personally obtained by me. Portions of this information were initially documented by the CMA and reviewed by me for thoroughness and accuracy.      Marcille Buffy, [place] [phone] (phone) [phone] (fax)  [place]

## 2020-08-23 NOTE — Patient Instructions (Addendum)
Hypertension, Adult Hypertension is another name for high blood pressure. High blood pressure forces your heart to work harder to pump blood. This can cause problems over time. There are two numbers in a blood pressure reading. There is a top number (systolic) over a bottom number (diastolic). It is best to have a blood pressure that is below 120/80. Healthy choices can help lower your blood pressure, or you may need medicine to help lower it. What are the causes? The cause of this condition is not known. Some conditions may be related to high blood pressure. What increases the risk?  Smoking.  Having type 2 diabetes mellitus, high cholesterol, or both.  Not getting enough exercise or physical activity.  Being overweight.  Having too much fat, sugar, calories, or salt (sodium) in your diet.  Drinking too much alcohol.  Having long-term (chronic) kidney disease.  Having a family history of high blood pressure.  Age. Risk increases with age.  Race. You may be at higher risk if you are African American.  Gender. Men are at higher risk than women before age 53. After age 63, women are at higher risk than men.  Having obstructive sleep apnea.  Stress. What are the signs or symptoms?  High blood pressure may not cause symptoms. Very high blood pressure (hypertensive crisis) may cause: ? Headache. ? Feelings of worry or nervousness (anxiety). ? Shortness of breath. ? Nosebleed. ? A feeling of being sick to your stomach (nausea). ? Throwing up (vomiting). ? Changes in how you see. ? Very bad chest pain. ? Seizures. How is this treated?  This condition is treated by making healthy lifestyle changes, such as: ? Eating healthy foods. ? Exercising more. ? Drinking less alcohol.  Your health care provider may prescribe medicine if lifestyle changes are not enough to get your blood pressure under control, and if: ? Your top number is above 130. ? Your bottom number is above  80.  Your personal target blood pressure may vary. Follow these instructions at home: Eating and drinking  If told, follow the DASH eating plan. To follow this plan: ? Fill one half of your plate at each meal with fruits and vegetables. ? Fill one fourth of your plate at each meal with whole grains. Whole grains include whole-wheat pasta, brown rice, and whole-grain bread. ? Eat or drink low-fat dairy products, such as skim milk or low-fat yogurt. ? Fill one fourth of your plate at each meal with low-fat (lean) proteins. Low-fat proteins include fish, chicken without skin, eggs, beans, and tofu. ? Avoid fatty meat, cured and processed meat, or chicken with skin. ? Avoid pre-made or processed food.  Eat less than 1,500 mg of salt each day.  Do not drink alcohol if: ? Your doctor tells you not to drink. ? You are pregnant, may be pregnant, or are planning to become pregnant.  If you drink alcohol: ? Limit how much you use to:  0-1 drink a day for women.  0-2 drinks a day for men. ? Be aware of how much alcohol is in your drink. In the [place] one drink equals one 12 oz bottle of beer (355 mL), one 5 oz glass of wine (148 mL), or one 1 oz glass of hard liquor (44 mL).   Lifestyle  Work with your doctor to stay at a healthy weight or to lose weight. Ask your doctor what the best weight is for you.  Get at least 30 minutes of exercise  most days of the week. This may include walking, swimming, or biking.  Get at least 30 minutes of exercise that strengthens your muscles (resistance exercise) at least 3 days a week. This may include lifting weights or doing Pilates.  Do not use any products that contain nicotine or tobacco, such as cigarettes, e-cigarettes, and chewing tobacco. If you need help quitting, ask your doctor.  Check your blood pressure at home as told by your doctor.  Keep all follow-up visits as told by your doctor. This is important.   Medicines  Take over-the-counter  and prescription medicines only as told by your doctor. Follow directions carefully.  Do not skip doses of blood pressure medicine. The medicine does not work as well if you skip doses. Skipping doses also puts you at risk for problems.  Ask your doctor about side effects or reactions to medicines that you should watch for. Contact a doctor if you:  Think you are having a reaction to the medicine you are taking.  Have headaches that keep coming back (recurring).  Feel dizzy.  Have swelling in your ankles.  Have trouble with your vision. Get help right away if you:  Get a very bad headache.  Start to feel mixed up (confused).  Feel weak or numb.  Feel faint.  Have very bad pain in your: ? Chest. ? Belly (abdomen).  Throw up more than once.  Have trouble breathing. Summary  Hypertension is another name for high blood pressure.  High blood pressure forces your heart to work harder to pump blood.  For most people, a normal blood pressure is less than 120/80.  Making healthy choices can help lower blood pressure. If your blood pressure does not get lower with healthy choices, you may need to take medicine. This information is not intended to replace advice given to you by your health care provider. Make sure you discuss any questions you have with your health care provider. Document Revised: 03/23/2018 Document Reviewed: 03/23/2018 Elsevier Patient Education  2021 [place].   Weight loss.   Ozempic / Semaglutide injection solution What is this medicine? SEMAGLUTIDE (Sem a GLOO tide) is used to improve blood sugar control in adults with type 2 diabetes. This medicine may be used with other diabetes medicines. This drug may also reduce the risk of heart attack or stroke if you have type 2 diabetes and risk factors for heart disease. This medicine may be used for other purposes; ask your health care provider or pharmacist if you have questions. COMMON BRAND NAME(S):  OZEMPIC What should I tell my health care provider before I take this medicine? They need to know if you have any of these conditions:  endocrine tumors (MEN 2) or if someone in your family had these tumors  eye disease, vision problems  history of pancreatitis  kidney disease  stomach problems  thyroid cancer or if someone in your family had thyroid cancer  an unusual or allergic reaction to semaglutide, other medicines, foods, dyes, or preservatives  pregnant or trying to get pregnant  breast-feeding How should I use this medicine? This medicine is for injection under the skin of your upper leg (thigh), stomach area, or upper arm. It is given once every week (every 7 days). You will be taught how to prepare and give this medicine. Use exactly as directed. Take your medicine at regular intervals. Do not take it more often than directed. If you use this medicine with insulin, you should inject this medicine  and the insulin separately. Do not mix them together. Do not give the injections right next to each other. Change (rotate) injection sites with each injection. It is important that you put your used needles and syringes in a special sharps container. Do not put them in a trash can. If you do not have a sharps container, call your pharmacist or healthcare provider to get one. A special MedGuide will be given to you by the pharmacist with each prescription and refill. Be sure to read this information carefully each time. This drug comes with INSTRUCTIONS FOR USE. Ask your pharmacist for directions on how to use this drug. Read the information carefully. Talk to your pharmacist or health care provider if you have questions. Talk to your pediatrician regarding the use of this medicine in children. Special care may be needed. Overdosage: If you think you have taken too much of this medicine contact a poison control center or emergency room at once. NOTE: This medicine is only for you. Do  not share this medicine with others. What if I miss a dose? If you miss a dose, take it as soon as you can within 5 days after the missed dose. Then take your next dose at your regular weekly time. If it has been longer than 5 days after the missed dose, do not take the missed dose. Take the next dose at your regular time. Do not take double or extra doses. If you have questions about a missed dose, contact your health care provider for advice. What may interact with this medicine?  other medicines for diabetes Many medications may cause changes in blood sugar, these include:  alcohol containing beverages  antiviral medicines for HIV or AIDS  aspirin and aspirin-like drugs  certain medicines for blood pressure, heart disease, irregular heart beat  chromium  diuretics  female hormones, such as estrogens or progestins, birth control pills  fenofibrate  gemfibrozil  isoniazid  lanreotide  female hormones or anabolic steroids  MAOIs like Carbex, Eldepryl, Marplan, Nardil, and Parnate  medicines for weight loss  medicines for allergies, asthma, cold, or cough  medicines for depression, anxiety, or psychotic disturbances  niacin  nicotine  NSAIDs, medicines for pain and inflammation, like ibuprofen or naproxen  octreotide  pasireotide  pentamidine  phenytoin  probenecid  quinolone antibiotics such as ciprofloxacin, levofloxacin, ofloxacin  some herbal dietary supplements  steroid medicines such as prednisone or cortisone  sulfamethoxazole; trimethoprim  thyroid hormones Some medications can hide the warning symptoms of low blood sugar (hypoglycemia). You may need to monitor your blood sugar more closely if you are taking one of these medications. These include:  beta-blockers, often used for high blood pressure or heart problems (examples include atenolol, metoprolol, propranolol)  clonidine  guanethidine  reserpine This list may not describe all  possible interactions. Give your health care provider a list of all the medicines, herbs, non-prescription drugs, or dietary supplements you use. Also tell them if you smoke, drink alcohol, or use illegal drugs. Some items may interact with your medicine. What should I watch for while using this medicine? Visit your doctor or health care professional for regular checks on your progress. Drink plenty of fluids while taking this medicine. Check with your doctor or health care professional if you get an attack of severe diarrhea, nausea, and vomiting. The loss of too much body fluid can make it dangerous for you to take this medicine. A test called the HbA1C (A1C) will be monitored. This is  a simple blood test. It measures your blood sugar control over the last 2 to 3 months. You will receive this test every 3 to 6 months. Learn how to check your blood sugar. Learn the symptoms of low and high blood sugar and how to manage them. Always carry a quick-source of sugar with you in case you have symptoms of low blood sugar. Examples include hard sugar candy or glucose tablets. Make sure others know that you can choke if you eat or drink when you develop serious symptoms of low blood sugar, such as seizures or unconsciousness. They must get medical help at once. Tell your doctor or health care professional if you have high blood sugar. You might need to change the dose of your medicine. If you are sick or exercising more than usual, you might need to change the dose of your medicine. Do not skip meals. Ask your doctor or health care professional if you should avoid alcohol. Many nonprescription cough and cold products contain sugar or alcohol. These can affect blood sugar. Pens should never be shared. Even if the needle is changed, sharing may result in passing of viruses like hepatitis or HIV. Wear a medical ID bracelet or chain, and carry a card that describes your disease and details of your medicine and dosage  times. Do not become pregnant while taking this medicine. Women should inform their doctor if they wish to become pregnant or think they might be pregnant. There is a potential for serious side effects to an unborn child. Talk to your health care professional or pharmacist for more information. What side effects may I notice from receiving this medicine? Side effects that you should report to your doctor or health care professional as soon as possible:  allergic reactions like skin rash, itching or hives, swelling of the face, lips, or tongue  breathing problems  changes in vision  diarrhea that continues or is severe  lump or swelling on the neck  severe nausea  signs and symptoms of infection like fever or chills; cough; sore throat; pain or trouble passing urine  signs and symptoms of low blood sugar such as feeling anxious, confusion, dizziness, increased hunger, unusually weak or tired, sweating, shakiness, cold, irritable, headache, blurred vision, fast heartbeat, loss of consciousness  signs and symptoms of kidney injury like trouble passing urine or change in the amount of urine  trouble swallowing  unusual stomach upset or pain  vomiting Side effects that usually do not require medical attention (report to your doctor or health care professional if they continue or are bothersome):  constipation  diarrhea  nausea  pain, redness, or irritation at site where injected  stomach upset This list may not describe all possible side effects. Call your doctor for medical advice about side effects. You may report side effects to FDA at 1-800-FDA-1088. Where should I keep my medicine? Keep out of the reach of children. Store unopened pens in a refrigerator between 2 and 8 degrees C (36 and 46 degrees F). Do not freeze. Protect from light and heat. After you first use the pen, it can be stored for 56 days at room temperature between 15 and 30 degrees C (59 and 86 degrees F) or  in a refrigerator. Throw away your used pen after 56 days or after the expiration date, whichever comes first. Do not store your pen with the needle attached. If the needle is left on, medicine may leak from the pen. NOTE: This sheet is a  summary. It may not cover all possible information. If you have questions about this medicine, talk to your doctor, pharmacist, or health care provider.  2021 Elsevier/Gold Standard (2019-03-28 09:41:51) Conntrave / Bupropion; Naltrexone extended-release tablets What is this medicine? BUPROPION; NALTREXONE (byoo PROE pee on; nal TREX one) is a combination of two drugs that help you lose weight. This product is used with a reduced calorie diet and exercise. This product can also help you maintain weight loss. This medicine may be used for other purposes; ask your health care provider or pharmacist if you have questions. COMMON BRAND NAME(S): Contrave What should I tell my health care provider before I take this medicine? They need to know if you have any of these conditions:  an eating disorder, such as anorexia or bulimia  diabetes  depression  glaucoma  head injury  heart disease  high blood pressure  history of drug abuse or alcohol abuse problem  history of a tumor or infection of your brain or spine  history of heart attack or stroke  history of irregular heartbeat  if you often drink alcohol  kidney disease  liver disease  low levels of sodium in the blood  mental illness  seizures  suicidal thoughts, plans, or attempt; a previous suicide attempt by you or a family member  taken an MAOI like Carbex, Eldepryl, Marplan, Nardil, or Parnate in last 14 days  an unusual or allergic reaction to bupropion, naltrexone, other medicines, foods, dyes, or preservatives  breast-feeding  pregnant or trying to become pregnant How should I use this medicine? Take this medicine by mouth with a glass of water. Follow the directions on the  prescription label. Do not cut, crush or chew this medicine. Swallow the tablets whole. You can take it with or without food. Do not take with high-fat meals as this may increase your risk of seizures. Take your medicine at regular intervals. Do not take it more often than directed. Do not stop taking except on your doctor's advice. A special MedGuide will be given to you by the pharmacist with each prescription and refill. Be sure to read this information carefully each time. Talk to your pediatrician regarding the use of this medicine in children. Special care may be needed. Overdosage: If you think you have taken too much of this medicine contact a poison control center or emergency room at once. NOTE: This medicine is only for you. Do not share this medicine with others. What if I miss a dose? If you miss a dose, skip it. Take your next dose at the normal time. Do not take extra or 2 doses at the same time to make up for the missed dose. What may interact with this medicine? Do not take this medicine with any of the following medications:  any medicines used to stop taking opioids such as methadone or buprenorphine  linezolid  MAOIs like Carbex, Eldepryl, Marplan, Nardil, and Parnate  methylene blue (injected into a vein)  often take narcotic medicines for pain or cough  other medicines that contain bupropion like Zyban or Wellbutrin This medicine may also interact with the following medications:  alcohol  certain medicines for blood pressure like metoprolol, propranolol  certain medicines for depression, anxiety, or psychotic disturbances  certain medicines for HIV or hepatitis  certain medicines for irregular heart beat like propafenone, flecainide  certain medicines for Parkinson's disease like amantadine, levodopa  certain medicines for seizures like carbamazepine, phenytoin, phenobarbital  certain medicines for  sleep  cimetidine  clopidogrel  cyclophosphamide  digoxin  disulfiram  furazolidone  isoniazid  nicotine  orphenadrine  procarbazine  steroid medicines like prednisone or cortisone  stimulant medicines for attention disorders, weight loss, or to stay awake  tamoxifen  theophylline  thiotepa  ticlopidine  tramadol  warfarin This list may not describe all possible interactions. Give your health care provider a list of all the medicines, herbs, non-prescription drugs, or dietary supplements you use. Also tell them if you smoke, drink alcohol, or use illegal drugs. Some items may interact with your medicine. What should I watch for while using this medicine? Visit your doctor or healthcare provider for regular checks on your progress. This medicine may cause serious skin reactions. They can happen weeks to months after starting the medicine. Contact your healthcare provider right away if you notice fevers or flu-like symptoms with a rash. The rash may be red or purple and then turn into blisters or peeling of the skin. Or, you might notice a red rash with swelling of the face, lips or lymph nodes in your neck or under your arms. This medicine may affect blood sugar. Ask your healthcare provider if changes in diet or medicines are needed if you have diabetes. Patients and their families should watch out for new or worsening depression or thoughts of suicide. Also watch out for sudden changes in feelings such as feeling anxious, agitated, panicky, irritable, hostile, aggressive, impulsive, severely restless, overly excited and hyperactive, or not being able to sleep. If this happens, especially at the beginning of treatment or after a change in dose, call your healthcare provider. Avoid alcoholic drinks while taking this medicine. Drinking large amounts of alcoholic beverages, using sleeping or anxiety medicines, or quickly stopping the use of these agents while taking this  medicine may increase your risk for a seizure. Do not drive or use heavy machinery until you know how this medicine affects you. This medicine can impair your ability to perform these tasks. Women should inform their health care provider if they wish to become pregnant or think they might be pregnant. Losing weight while pregnant is not advised and may cause harm to the unborn child. Talk to your health care provider for more information. What side effects may I notice from receiving this medicine? Side effects that you should report to your doctor or health care professional as soon as possible:  allergic reactions like skin rash, itching or hives, swelling of the face, lips, or tongue  breathing problems  changes in vision  confusion  elevated mood, decreased need for sleep, racing thoughts, impulsive behavior  fast or irregular heartbeat  hallucinations, loss of contact with reality  increased blood pressure  rash, fever, and swollen lymph nodes  redness, blistering, peeling, or loosening of the skin, including inside the mouth  seizures  signs and symptoms of liver injury like dark yellow or brown urine; general ill feeling or flu-like symptoms; light-colored stools; loss of appetite; nausea; right upper belly pain; unusually weak or tired; yellowing of the eyes or skin  suicidal thoughts or other mood changes  vomiting Side effects that usually do not require medical attention (report to your doctor or health care professional if they continue or are bothersome):  constipation  headache  loss of appetite  indigestion, stomach upset  tremors This list may not describe all possible side effects. Call your doctor for medical advice about side effects. You may report side effects to FDA at 1-800-FDA-1088. Where should I keep  my medicine? Keep out of the reach of children. Store at room temperature between 15 and 30 degrees C (59 and 86 degrees F). Throw away any unused  medicine after the expiration date. NOTE: This sheet is a summary. It may not cover all possible information. If you have questions about this medicine, talk to your doctor, pharmacist, or health care provider.  2021 Elsevier/Gold Standard (2019-05-19 [id number])   Topmax/ Topiramate tablets What is this medicine? TOPIRAMATE (toe PYRE a mate) is used to treat seizures in adults or children with epilepsy. It is also used for the prevention of migraine headaches. This medicine may be used for other purposes; ask your health care provider or pharmacist if you have questions. COMMON BRAND NAME(S): Topamax, Topiragen What should I tell my health care provider before I take this medicine? They need to know if you have any of these conditions:  bleeding disorder  kidney disease  lung disease  suicidal thoughts, plans, or attempt  an unusual or allergic reaction to topiramate, other medicines, foods, dyes, or preservatives  pregnant or trying to get pregnant  breast-feeding How should I use this medicine? Take this medicine by mouth with a glass of water. Follow the directions on the prescription label. Do not cut, crush or chew this medicine. Swallow the tablets whole. You can take it with or without food. If it upsets your stomach, take it with food. Take your medicine at regular intervals. Do not take it more often than directed. Do not stop taking except on your doctor's advice. A special MedGuide will be given to you by the pharmacist with each prescription and refill. Be sure to read this information carefully each time. Talk to your pediatrician regarding the use of this medicine in children. While this drug may be prescribed for children as young as 712 years of age for selected conditions, precautions do apply. Overdosage: If you think you have taken too much of this medicine contact a poison control center or emergency room at once. NOTE: This medicine is only for you. Do not share this  medicine with others. What if I miss a dose? If you miss a dose, take it as soon as you can. If your next dose is to be taken in less than 6 hours, then do not take the missed dose. Take the next dose at your regular time. Do not take double or extra doses. What may interact with this medicine? This medicine may interact with the following medications:  acetazolamide  alcohol  antihistamines for allergy, cough, and cold  aspirin and aspirin-like medicines  atropine  birth control pills  certain medicines for anxiety or sleep  certain medicines for bladder problems like oxybutynin, tolterodine  certain medicines for depression like amitriptyline, fluoxetine, sertraline  certain medicines for seizures like carbamazepine, phenobarbital, phenytoin, primidone, valproic acid, zonisamide  certain medicines for stomach problems like dicyclomine, hyoscyamine  certain medicines for travel sickness like scopolamine  certain medicines for Parkinson's disease like benztropine, trihexyphenidyl  certain medicines that treat or prevent blood clots like warfarin, enoxaparin, dalteparin, apixaban, dabigatran, and rivaroxaban  digoxin  general anesthetics like halothane, isoflurane, methoxyflurane, propofol  hydrochlorothiazide  ipratropium  lithium  medicines that relax muscles for surgery  metformin  narcotic medicines for pain  NSAIDs, medicines for pain and inflammation, like ibuprofen or naproxen  phenothiazines like chlorpromazine, mesoridazine, prochlorperazine, thioridazine  pioglitazone This list may not describe all possible interactions. Give your health care provider a list of all the medicines, herbs,  non-prescription drugs, or dietary supplements you use. Also tell them if you smoke, drink alcohol, or use illegal drugs. Some items may interact with your medicine. What should I watch for while using this medicine? Visit your doctor or health care professional for  regular checks on your progress. Tell your health care professional if your symptoms do not start to get better or if they get worse. Do not stop taking except on your health care professional's advice. You may develop a severe reaction. Your health care professional will tell you how much medicine to take. Wear a medical ID bracelet or chain. Carry a card that describes your disease and details of your medicine and dosage times. This medicine can reduce the response of your body to heat or cold. Dress warm in cold weather and stay hydrated in hot weather. If possible, avoid extreme temperatures like saunas, hot tubs, very hot or cold showers, or activities that can cause dehydration such as vigorous exercise. Check with your health care professional if you have severe diarrhea, nausea, and vomiting, or if you sweat a lot. The loss of too much body fluid may make it dangerous for you to take this medicine. You may get drowsy or dizzy. Do not drive, use machinery, or do anything that needs mental alertness until you know how this medicine affects you. Do not stand up or sit up quickly, especially if you are an older patient. This reduces the risk of dizzy or fainting spells. Alcohol may interfere with the effect of this medicine. Avoid alcoholic drinks. Tell your health care professional right away if you have any change in your eyesight. Patients and their families should watch out for new or worsening depression or thoughts of suicide. Also watch out for sudden changes in feelings such as feeling anxious, agitated, panicky, irritable, hostile, aggressive, impulsive, severely restless, overly excited and hyperactive, or not being able to sleep. If this happens, especially at the beginning of treatment or after a change in dose, call your healthcare professional. This medicine may cause serious skin reactions. They can happen weeks to months after starting the medicine. Contact your health care provider right  away if you notice fevers or flu-like symptoms with a rash. The rash may be red or purple and then turn into blisters or peeling of the skin. Or, you might notice a red rash with swelling of the face, lips or lymph nodes in your neck or under your arms. Birth control may not work properly while you are taking this medicine. Talk to your health care professional about using an extra method of birth control. Women should inform their health care professional if they wish to become pregnant or think they might be pregnant. There is a potential for serious side effects and harm to an unborn child. Talk to your health care professional for more information. What side effects may I notice from receiving this medicine? Side effects that you should report to your doctor or health care professional as soon as possible:  allergic reactions like skin rash, itching or hives, swelling of the face, lips, or tongue  blood in the urine  changes in vision  confusion  loss of memory  pain in lower back or side  pain when urinating  redness, blistering, peeling or loosening of the skin, including inside the mouth  signs and symptoms of bleeding such as bloody or black, tarry stools; red or dark brown urine; spitting up blood or brown material that looks like coffee grounds;  red spots on the skin; unusual bruising or bleeding from the eyes, gums, or nose  signs and symptoms of increased acid in the body like breathing fast; fast heartbeat; headache; confusion; unusually weak or tired; nausea, vomiting  suicidal thoughts, mood changes  trouble speaking or understanding  unusual sweating  unusually weak or tired Side effects that usually do not require medical attention (report to your doctor or health care professional if they continue or are bothersome):  dizziness  drowsiness  fever  loss of appetite  nausea, vomiting  pain, tingling, numbness in the hands or feet  stomach  pain  tiredness  upset stomach This list may not describe all possible side effects. Call your doctor for medical advice about side effects. You may report side effects to FDA at 1-800-FDA-1088. Where should I keep my medicine? Keep out of the reach of children and pets. Store between 15 and 30 degrees C (59 and 86 degrees F). Protect from moisture. Keep the container tightly closed. Get rid of any unused medicine after the expiration date. To get rid of medicines that are no longer needed or have expired:  Take the medicine to a medicine take-back program. Check with your pharmacy or law enforcement to find a location.  If you cannot return the medicine, check the label or package insert to see if the medicine should be thrown out in the garbage or flushed down the toilet. If you are not sure, ask your health care provider. If it is safe to put it in the trash, empty the medicine out of the container. Mix the medicine with cat litter, dirt, coffee grounds, or other unwanted substance. Seal the mixture in a bag or container. Put it in the trash. NOTE: This sheet is a summary. It may not cover all possible information. If you have questions about this medicine, talk to your doctor, pharmacist, or health care provider.  2021 Elsevier/Gold Standard (2020-01-25 15:41:57)

## 2020-08-24 LAB — HEMOGLOBIN A1C
Est. average glucose Bld gHb Est-mCnc: 91 mg/dL
Hgb A1c MFr Bld: 4.8 % (ref 4.8–5.6)

## 2020-08-24 LAB — TSH: TSH: 0.842 u[IU]/mL (ref 0.450–4.500)

## 2020-08-24 LAB — B12 AND FOLATE PANEL
Folate: 6.6 ng/mL (ref >3.0)
Vitamin B-12: 373 pg/mL (ref 232–1245)

## 2020-08-25 ENCOUNTER — Encounter: Payer: Self-pay | Admitting: Adult Health

## 2020-08-25 NOTE — Progress Notes (Signed)
Hemoglobin A1C is within normal limits.  B12 and folate within normal limits.   TSH ok for thyroid.

## 2020-08-27 NOTE — Telephone Encounter (Signed)
Can you see if lab can add on vitamin D - for vitamin D deficiency diagnosis ?

## 2020-08-28 ENCOUNTER — Ambulatory Visit: Payer: Self-pay | Admitting: Adult Health

## 2020-08-29 LAB — SPECIMEN STATUS REPORT

## 2020-08-29 LAB — VITAMIN D 25 HYDROXY (VIT D DEFICIENCY, FRACTURES): Vit D, 25-Hydroxy: 23.9 ng/mL — ABNORMAL LOW (ref 30.0–100.0)

## 2020-08-29 NOTE — Progress Notes (Signed)
Vitamin  D is low, this can contribute to poor sleep and fatigue, will send in prescription for Vitamin D at 50,000 units by mouth once every 7 days/(once weekly) for 12 weeks. Advise recheck lab Vitamin D in 1-2 weeks after completing vitamin d prescription. Lab iis walk in and is closed during lunch during regular office hours.

## 2020-08-31 ENCOUNTER — Encounter: Payer: Self-pay | Admitting: Adult Health

## 2020-09-02 ENCOUNTER — Other Ambulatory Visit: Payer: Self-pay

## 2020-09-02 ENCOUNTER — Encounter: Payer: Self-pay | Admitting: Certified Nurse Midwife

## 2020-09-02 ENCOUNTER — Ambulatory Visit (INDEPENDENT_AMBULATORY_CARE_PROVIDER_SITE_OTHER): Payer: BC Managed Care – PPO | Admitting: Certified Nurse Midwife

## 2020-09-02 VITALS — BP 101/71 | HR 72 | Wt 211.1 lb

## 2020-09-02 DIAGNOSIS — Z30433 Encounter for removal and reinsertion of intrauterine contraceptive device: Secondary | ICD-10-CM | POA: Diagnosis not present

## 2020-09-02 NOTE — Patient Instructions (Addendum)
Intrauterine Device Information An intrauterine device (IUD) is a medical device that is inserted into the uterus to prevent pregnancy. It is a small, T-shaped device that has one or two nylon strings hanging down from it. The strings hang out of the lower part of the uterus (cervix) to allow for future IUD removal. There are two types of IUDs:  Hormone IUD. This type of IUD is made of plastic and contains the hormone progestin (synthetic progesterone). A hormone IUD may last 3-5 years.  Copper IUD. This type of IUD has copper wire wrapped around it. A copper IUD may last up to 10 years. How is an IUD inserted? An IUD is inserted through the vagina, through the cervix, and into the uterus with a minor medical procedure. The procedure for IUD insertion may vary among health care providers and hospitals. How does an IUD work? Synthetic progesterone in a hormonal IUD prevents pregnancy by:  Thickening cervical mucus to prevent sperm from entering the uterus.  Thinning the uterine lining to prevent a fertilized egg from being implanted there. Copper in a copper IUD prevents pregnancy by making the uterus and fallopian tubes produce a fluid that kills sperm. What are the advantages of an IUD? Advantages of either type of IUD An IUD:  Is highly effective in preventing pregnancy.  Is reversible. You can become pregnant shortly after the IUD is removed.  Is low-maintenance and can stay in place for a long time.  Has no estrogen-related side effects.  Can be used when breastfeeding.  Is not associated with weight gain.  Can be inserted right after childbirth, an abortion, or a miscarriage. Advantages of a hormone IUD  If it is inserted within 7 days of your period starting, it works right after it has been inserted. If the hormone IUD is inserted at any other time in your cycle, you will need to use a backup method of birth control for 7 days after insertion.  It can make menstrual periods  lighter or stop completely.  It can reduce menstrual cramping and other discomforts from menstrual periods.  It can be used for 3-5 years, depending on which IUD you have. Advantages of a copper IUD  It works right after it is inserted.  It can be used as a form of emergency birth control if it is inserted within 5 days after having unprotected sex.  It does not interfere with your body's natural hormones.  It can be used for up to 10 years. What are the disadvantages of an IUD?  An IUD may cause irregular menstrual bleeding for a period of time after insertion.  It is common to have pain during insertion and have cramping and vaginal bleeding after insertion.  An IUD may cut the uterus (uterine perforation) when it is inserted. This is rare.  Pelvic inflammatory disease (PID) may happen after insertion of an IUD. PID is an infection in the uterus and fallopian tubes. The IUD does not cause the infection. The infection is usually from an unknown sexually transmitted infection (STI). This is rare, and it usually happens during the first 20 days after the IUD is inserted.  A copper IUD can make your menstrual flow heavier and more painful.  IUDs cannot prevent sexually transmitted infections (STIs). How is an IUD removed?   You will lie on your back with your knees bent and your feet in footrests (stirrups).  A device will be inserted into your vagina to spread apart the vaginal walls (  speculum). This will allow your health care provider to see the strings attached to the IUD.  Your health care provider will use a small instrument (forceps) to grasp the IUD strings and will pull firmly until the IUD is removed. You may have some discomfort when the IUD is removed. Your health care provider may recommend taking over-the-counter pain relievers, such as ibuprofen, before the procedure. You may also have minor spotting for a few days after the procedure. The procedure for IUD removal may  vary among health care providers and hospitals. Is an IUD right for me? If you are interested in an IUD, discuss it with your health care provider. He or she will make sure you are a good candidate for an IUD and will let you know more about the advantages, disadvantage, and possible side effects. This will allow you to make a decision about the device. Summary  An intrauterine device (IUD) is a medical device that is inserted in the uterus to prevent pregnancy. It is a small, T-shaped device that has one or two nylon strings hanging down from it.  A hormone IUD contains the hormone progestin (synthetic progesterone). A copper IUD has copper wire wrapped around it.  Synthetic progesterone in a hormone IUD prevents pregnancy by thickening cervical mucus and thinning the walls of the uterus. Copper in a copper IUD prevents pregnancy by making the uterus and fallopian tubes produce a fluid that kills sperm.  A hormone IUD can be left in place for 3-5 years. A copper IUD can be left in place for up to 10 years.  An IUD is inserted and removed by a health care provider. You may feel some pain during insertion and removal. Your health care provider may recommend taking over-the-counter pain medicine, such as ibuprofen, before an IUD procedure. This information is not intended to replace advice given to you by your health care provider. Make sure you discuss any questions you have with your health care provider. Document Revised: 01/24/2020 Document Reviewed: 01/24/2020 Elsevier Patient Education  2021 [place].    IUD PLACEMENT POST-PROCEDURE INSTRUCTIONS  1. You may take Ibuprofen, Aleve or Tylenol for pain if needed.  Cramping should resolve within in 24 hours.  2. You may have a small amount of spotting.  You should wear a mini pad for the next few days.  3. You may have intercourse after 72 hours.  If you using this for birth control, it is effective immediately.  4. You need to call  if you have any pelvic pain, fever, heavy bleeding or foul smelling vaginal discharge.  Irregular bleeding is common the first several months after having an IUD placed. You do not need to call for this reason unless you are concerned.  5. Shower or bathe as normal  6. You should have a follow-up appointment in 4-8 weeks for a re-check to make sure you are not having any problems.

## 2020-09-02 NOTE — Progress Notes (Signed)
Lauren [name] is a 40 y.o. year old [id number] Caucasian female who presents for removal of Mirena IUD and placement of a Paragard IUD. She was given informed consent for IUD removal and reinsertion. Her Mirena was placed 10/04/2015.   The risks and benefits of the method and placement have been thouroughly reviewed with the patient and all questions were answered.  Specifically the patient is aware of failure rate of 07/998, expulsion of the IUD and of possible perforation.    The patient is aware of heavy painful menses associated with this method.  Signed copy of informed consent in chart.   BP 101/71   Pulse 72   Wt 211 lb 1.6 oz (95.8 kg)   BMI 38.61 kg/m     Appropriate time out taken. A small plastic speculum was placed in the vagina.  The cervix was visualized, prepped using Betadine. The strings were visible. They were grasped and the Mirena was easily removed.   The cervix was then grasped with a single-tooth tenaculum. The uterus was sounded to 7 cm. Paragard IUD placed per manufacturer's recommendations without complications. The strings were trimmed to 3 cm.  The patient tolerated the procedure well.   The patient was given post procedure instructions, including signs and symptoms of infection and to check for the strings after each menses or each month, and refraining from intercourse or anything in the vagina for 3 days.  She was given a Pargard care card with date Paragard placed, and date Paragard to be removed.  Reviewed red flag symptoms and when to call.   RTC x 6-8 weeks for IUD string check or sooner if needed.    Dani Gobble, CNM  [facility] 09/02/20 9:02 AM   NDC: [address]-3 Lot: [id number] Exp: 10/2025

## 2020-09-03 ENCOUNTER — Other Ambulatory Visit: Payer: Self-pay | Admitting: Adult Health

## 2020-09-03 DIAGNOSIS — E663 Overweight: Secondary | ICD-10-CM

## 2020-09-03 DIAGNOSIS — Z6839 Body mass index (BMI) 39.0-39.9, adult: Secondary | ICD-10-CM

## 2020-09-03 MED ORDER — SEMAGLUTIDE-WEIGHT MANAGEMENT 0.25 MG/0.5ML ~~LOC~~ SOAJ: 0.2500 mg | SUBCUTANEOUS | 0 refills | Status: DC

## 2020-09-03 NOTE — Progress Notes (Signed)
  BMI 39.0-39.9,adult  Overweight

## 2020-09-06 NOTE — Telephone Encounter (Signed)
We received the pre authorization today and are working on submitting it.

## 2020-09-12 ENCOUNTER — Other Ambulatory Visit: Payer: Self-pay | Admitting: Adult Health

## 2020-09-12 NOTE — Progress Notes (Signed)
Yes unfortunately then this will not be coveted for weight loss reasons. We can refer to bariatrics or to nutritionist if she would like.

## 2020-09-16 ENCOUNTER — Encounter: Payer: Self-pay | Admitting: Adult Health

## 2020-09-17 ENCOUNTER — Other Ambulatory Visit: Payer: Self-pay | Admitting: Adult Health

## 2020-09-17 DIAGNOSIS — E663 Overweight: Secondary | ICD-10-CM

## 2020-09-17 MED ORDER — SEMAGLUTIDE-WEIGHT MANAGEMENT 0.25 MG/0.5ML ~~LOC~~ SOAJ: 0.2500 mg | SUBCUTANEOUS | 0 refills | Status: DC

## 2020-09-25 ENCOUNTER — Encounter: Payer: BC Managed Care – PPO | Attending: Adult Health | Admitting: Dietician

## 2020-09-25 ENCOUNTER — Encounter: Payer: Self-pay | Admitting: Dietician

## 2020-09-25 ENCOUNTER — Other Ambulatory Visit: Payer: Self-pay

## 2020-09-25 VITALS — Ht 62.0 in | Wt 212.9 lb

## 2020-09-25 DIAGNOSIS — E669 Obesity, unspecified: Secondary | ICD-10-CM

## 2020-09-25 DIAGNOSIS — Z6838 Body mass index (BMI) 38.0-38.9, adult: Secondary | ICD-10-CM | POA: Diagnosis not present

## 2020-09-25 DIAGNOSIS — Z9884 Bariatric surgery status: Secondary | ICD-10-CM

## 2020-09-25 NOTE — Patient Instructions (Addendum)
   Plan to eat something every 3-4 hours during the day.  Pre-portion snacks, keeping carbs to 15g or less and sugar and fat to single digits (9g or less). Add some lean protein or low-carb veggies to snacks to prevent hunger if needed.  Resume using smaller plates for meals and snacks.   Continue to exercise regularly, gradually work up to at least 150 minutes weekly, ideally up to 200 or more. Consider adding some strength building exercises ie squats, lunges, situps, etc.

## 2020-09-25 NOTE — Progress Notes (Signed)
Medical Nutrition Therapy: Visit start time: 0820  end time: 0920  Assessment:  Diagnosis: overweight with history of bariatric surgery Past medical history: HTN Psychosocial issues/ stress concerns: history of anxiety, depression; takes medication  Preferred learning method:  . Hands-on   Current weight: 212.9lbs  Height: 5'2" BMI: 38.94 InBody Results: 57.1lbs muscle mass; 51% body fat  Medications, supplements: reconciled list in medical record  Progress and evaluation:   Patient reports having sleeve gastrectomy in 2015, lost over 100lbs, down to about 186bs and maintained until 1-2 years ago.   Began working from home several months ago, so less physical activity and she feels she is likely snacking more.  She has recently resumed some exercise, using parents' treadmill (parents live close by).  Reports chips, pasta are trigger foods, sometimes chocolate.  She is unable to eat much in am without feeling some GI upset, so usually does not eat until 10-11am.   Reports intolerance to milk; does tolerate cheese and yogurt.   Physical activity: walking on treadmill 20-30 minutes, 3-4 times a aweek  Dietary Intake:  Usual eating pattern includes 2 meals and several snacks per day. Dining out frequency: 0 meals per week.  Breakfast: 23-53IR 2 eggs with 2 slices bacon; protein bar; leftovers from previous dinner Snack:  Lunch: pkg crackers/ chips/ low carb tortilla with ham/ cheese Snack: 1 banana daily Supper: chicken/ ground turkey/ not much red meat/ does not like seafood + potato/ pasta/ occ keto bread + veg ie peppers and onions Snack: chips/ chocolate chips Beverages: water only with flavoring  Nutrition Care Education: Topics covered:  Basic nutrition: basic food groups, appropriate nutrient balance, appropriate meal and snack schedule, general nutrition guidelines    Weight control: determining reasonable weight loss rate, importance of low sugar and low fat choices,  portion control strategies, estimated energy needs at 900-1200kcal, provided guidance for 40% CHO, 33% protein, and 27% fat to meet bariatric guidelines; discussed goals for physical activity and benefits of strength building Other: advised bariatric or at least regular multivitamin daily + calcium supplement 2 times daily  Nutritional Diagnosis:  [place]-3.3 Overweight/obesity As related to inadequate physical activity and excess caloric intake.  As evidenced by patient with current BMI of 38.9. [id number] Predicted suboptimal nutrient intake As related to small food portions, lack of supplementation.  As evidenced by patient with history of bariatric surgery and report of current dietary intake .  Intervention:  . Instruction and discussion as noted above. . Patient voices readiness to make diet and lifestyle changes, and has begun working on some changes.  . Established nutrition goals with direction from patient.   Education Materials given:  . Plate Planner with food lists, sample meal pattern . Bariatric menus for 1000, 1200kcal . Visit summary with goals/ instructions   Learner/ who was taught:  . Patient   Level of understanding: Marland Kitchen Verbalizes/ demonstrates competency   Demonstrated degree of understanding via:   Teach back Learning barriers: . None  Willingness to learn/ readiness for change: . Eager, change in progress  Monitoring and Evaluation:  Dietary intake, exercise, and body weight      follow up: 11/06/20 at 8:15am

## 2020-10-14 ENCOUNTER — Encounter: Payer: BC Managed Care – PPO | Admitting: Certified Nurse Midwife

## 2020-11-02 ENCOUNTER — Other Ambulatory Visit: Payer: Self-pay | Admitting: Adult Health

## 2020-11-02 DIAGNOSIS — I10 Essential (primary) hypertension: Secondary | ICD-10-CM

## 2020-11-02 NOTE — Telephone Encounter (Signed)
Requested Prescriptions  Pending Prescriptions Disp Refills  . hydrochlorothiazide (HYDRODIURIL) 12.5 MG tablet [Pharmacy Med Name: hydroCHLOROthiazide 12.5 MG Oral Tablet] 90 tablet 0    Sig: Take 1 tablet by mouth once daily     Cardiovascular: Diuretics - Thiazide Passed - 11/02/2020  9:34 AM      Passed - Ca in normal range and within 360 days    Calcium  Date Value Ref Range Status  07/31/2020 9.3 8.7 - 10.2 mg/dL Final   Calcium, Total  Date Value Ref Range Status  03/14/2014 8.3 (L) 8.5 - 10.1 mg/dL Final         Passed - Cr in normal range and within 360 days    Creatinine  Date Value Ref Range Status  03/14/2014 1.03 0.60 - 1.30 mg/dL Final   Creatinine, Ser  Date Value Ref Range Status  07/31/2020 0.79 0.57 - 1.00 mg/dL Final         Passed - K in normal range and within 360 days    Potassium  Date Value Ref Range Status  07/31/2020 4.0 3.5 - 5.2 mmol/L Final  03/14/2014 3.9 3.5 - 5.1 mmol/L Final         Passed - Na in normal range and within 360 days    Sodium  Date Value Ref Range Status  07/31/2020 142 134 - 144 mmol/L Final  03/14/2014 138 136 - 145 mmol/L Final         Passed - Last BP in normal range    BP Readings from Last 1 Encounters:  09/02/20 101/71         Passed - Valid encounter within last 6 months    Recent Outpatient Visits          2 months ago Major depressive disorder, single episode in full remission ([place])   [place], Kelby Aline, FNP   1 year ago Encounter for routine adult health examination without abnormal findings   [facility], Kelby Aline, FNP   1 year ago Essential (primary) hypertension   [facility] Glean Hess, MD   2 years ago Annual physical exam   [facility] Glean [facility], MD   2 years ago Chest pain, unspecified type   [facility] Glean Hess, MD             . buPROPion (WELLBUTRIN XL) 150 MG 24 hr tablet  [Pharmacy Med Name: buPROPion HCl ER (XL) 150 MG Oral Tablet Extended Release 24 Hour] 90 tablet 0    Sig: Take 1 tablet by mouth once daily     Psychiatry: Antidepressants - bupropion Passed - 11/02/2020  9:34 AM      Passed - Completed PHQ-2 or PHQ-9 in the last 360 days      Passed - Last BP in normal range    BP Readings from Last 1 Encounters:  09/02/20 101/71         Passed - Valid encounter within last 6 months    Recent Outpatient Visits          2 months ago Major depressive disorder, single episode in full remission [facility])   [place], Kelby Aline, FNP   1 year ago Encounter for routine adult health examination without abnormal findings   [facility] [place], Kelby Aline, FNP   1 year ago Essential (primary) hypertension   [facility] Glean Hess, MD   2 years ago  Annual physical exam   [facility] Glean Hess, MD   2 years ago Chest pain, unspecified type   [facility] Glean Hess, MD

## 2020-11-05 ENCOUNTER — Encounter: Payer: Self-pay | Admitting: Adult Health

## 2020-11-06 ENCOUNTER — Ambulatory Visit: Payer: BC Managed Care – PPO | Admitting: Dietician

## 2020-12-11 ENCOUNTER — Encounter: Payer: BC Managed Care – PPO | Attending: Adult Health | Admitting: Dietician

## 2020-12-11 ENCOUNTER — Encounter: Payer: Self-pay | Admitting: Dietician

## 2020-12-11 ENCOUNTER — Other Ambulatory Visit: Payer: Self-pay

## 2020-12-11 VITALS — Ht 62.0 in | Wt 203.0 lb

## 2020-12-11 DIAGNOSIS — Z6838 Body mass index (BMI) 38.0-38.9, adult: Secondary | ICD-10-CM | POA: Insufficient documentation

## 2020-12-11 DIAGNOSIS — E669 Obesity, unspecified: Secondary | ICD-10-CM

## 2020-12-11 DIAGNOSIS — Z9884 Bariatric surgery status: Secondary | ICD-10-CM | POA: Diagnosis not present

## 2020-12-11 NOTE — Patient Instructions (Signed)
   Continue with current eating pattern, great job making healthy choices and controlling portions!  Increase exercise frequency to 3-5 days per week. Consider adding some strength building exercise such as lunges, squats, wall push-ups, etc.  Take a multivitamin daily, either a bariatric formula, or a Flintstones complete 1-2 times a day. And an additional calcium supplement 1-2 times a day, taken at least 2 hours apart from the multi.

## 2020-12-11 NOTE — Progress Notes (Signed)
Medical Nutrition Therapy: Visit start time: 0830  end time: 0900  Assessment:  Diagnosis: history of bariatric surgery, obesity Medical history changes: no changes Psychosocial issues/ stress concerns: history of anxiety, depression; taking medication to treat  Current weight: 203.0lbs Height: 5'2" BMI: 37.13 Medications, supplement changes: no changes  Progress and evaluation:  . Weight has decreased by 10lbs since 09/25/20. . Patient reports she has stopped eating potato chips and reduced chocolate chips, snacking less often. She has also been measuring food portions.  . Reports weight was down to 200, and recently gained about 3lbs back due to less exercise and some snacks.   Physical activity: walking 20-30 minutes 1-2 times a week  Dietary Intake:  Usual eating pattern includes 3 meals and 1-2 snacks per day. Dining out frequency: 0-1 meals per week.  Breakfast: 97-35HG 2 eggs with 2 slices bacon; protein bar; leftovers from previous dinner Snack: none Lunch: protein chips ([employer]); sandwich with keto bread; low carb tortilla wrap with ham and cheese Snack: 1 banana daily Supper: chicken// [place] + potato/ rice/ pasta + low carb vegetables Snack: energy ball with oats, peanut butter powder, a few chocolate chips/ fiber one bar Beverages: water, flavored water, protein shake usually throughout the day, or as a snack  Nutrition Care Education: Topics covered:      Weight control: reviewed progress since previous visit; discussed adding vegetables and/or fruits to lunch meal or snacks; reviewed and discussed vitamin/ mineral supplementation needs and options; discussed exercise goals and options and relationship to metabolic rate and long term weight loss success  Nutritional Diagnosis:  [place]-3.3 Overweight/obesity As related to history of excess calories and inadequate physical activity after weight loss surgery.  As evidenced by patient with current BMI of 37, making diet and  lifestyle changes for ongoing weight loss.  Intervention:  . Instruction and discussion as noted above. . Commended patient for changes made and success with resuming weight loss.  Marland Kitchen Updated goals with input from patient.   Education Materials given:  . ASMBS vitamin and mineral needs for sleeve gastrectomy   Learner/ who was taught:  . Patient   Level of understanding: Marland Kitchen Verbalizes/ demonstrates competency   Demonstrated degree of understanding via:   Teach back Learning barriers: . None  Willingness to learn/ readiness for change: . Eager, change in progress   Monitoring and Evaluation:  Dietary intake, exercise, and body weight      follow up: 01/22/21 at 8:30am

## 2021-01-07 ENCOUNTER — Ambulatory Visit (INDEPENDENT_AMBULATORY_CARE_PROVIDER_SITE_OTHER): Payer: BC Managed Care – PPO | Admitting: Adult Health

## 2021-01-07 ENCOUNTER — Other Ambulatory Visit: Payer: Self-pay

## 2021-01-07 ENCOUNTER — Encounter: Payer: Self-pay | Admitting: Adult Health

## 2021-01-07 VITALS — BP 120/76 | HR 67 | Temp 98.0°F | Ht 62.01 in | Wt 202.0 lb

## 2021-01-07 DIAGNOSIS — E559 Vitamin D deficiency, unspecified: Secondary | ICD-10-CM

## 2021-01-07 DIAGNOSIS — Z6836 Body mass index (BMI) 36.0-36.9, adult: Secondary | ICD-10-CM

## 2021-01-07 DIAGNOSIS — B372 Candidiasis of skin and nail: Secondary | ICD-10-CM | POA: Diagnosis not present

## 2021-01-07 LAB — VITAMIN D 25 HYDROXY (VIT D DEFICIENCY, FRACTURES): VITD: 39.03 ng/mL (ref 30.00–100.00)

## 2021-01-07 MED ORDER — NYSTATIN 100000 UNIT/GM EX CREA: 1.0000 "application " | TOPICAL_CREAM | Freq: Two times a day (BID) | CUTANEOUS | 0 refills | Status: DC

## 2021-01-07 MED ORDER — FLUCONAZOLE 150 MG PO TABS: 150.0000 mg | ORAL_TABLET | ORAL | 0 refills | Status: DC

## 2021-01-07 NOTE — Progress Notes (Signed)
Established Patient Office Visit  Subjective:  Patient ID: Lauren [name], female    DOB: Sep 12, 1980  Age: 40 y.o. MRN: [id number]  CC:  Chief Complaint  Patient presents with   Follow-up    Pt states she has no concerns and to get Vitamin D checked.    HPI Lauren [name] presents for follow up on vitamin D.  She finished vitamin D script a few weeks back.   Umbilicus, has been itching as well as groin folds, she has no warmth or fevers.   Patient  denies any fever, body aches,chills,chest pain, shortness of breath, nausea, vomiting, or diarrhea.  Denies dizziness, lightheadedness, pre syncopal or syncopal episodes.    Past Medical History:  Diagnosis Date   Anxiety    Edema    ankle swelling    Past Surgical History:  Procedure Laterality Date   BARIATRIC SURGERY  02/2014   Gastric Sleeve   BASAL CELL CARCINOMA EXCISION  10/2015   CHOLECYSTECTOMY  02/2014   WISDOM TOOTH EXTRACTION      Family History  Problem Relation Age of Onset   Migraines Mother    Hypertension Mother    Hyperlipidemia Mother    Depression Mother    Sleep apnea Mother    Breast cancer Maternal Grandmother    Cancer Maternal Grandmother        breast   Asthma Daughter    Allergies Daughter    Asthma Son    Allergies Son    Cancer Maternal Grandfather        pancreatic     Social History   Socioeconomic History   Marital status: Single    Spouse name: Not on file   Number of children: Not on file   Years of education: Not on file   Highest education level: Not on file  Occupational History   Not on file  Tobacco Use   Smoking status: Never   Smokeless tobacco: Never  Substance and Sexual Activity   Alcohol use: No    Alcohol/week: 0.0 standard drinks   Drug use: No   Sexual activity: Yes    Birth control/protection: I.U.D.    Comment: mirena  Other Topics Concern   Not on file  Social History Narrative   Not on file   Social Determinants of Health    Financial Resource Strain: Not on file  Food Insecurity: Not on file  Transportation Needs: Not on file  Physical Activity: Not on file  Stress: Not on file  Social Connections: Not on file  Intimate Partner Violence: Not on file    Outpatient Medications Prior to Visit  Medication Sig Dispense Refill   buPROPion (WELLBUTRIN XL) 150 MG 24 hr tablet Take 1 tablet by mouth once daily 90 tablet 0   hydrochlorothiazide (HYDRODIURIL) 12.5 MG tablet Take 1 tablet by mouth once daily 90 tablet 0   Vitamin D, Ergocalciferol, (DRISDOL) 1.25 MG (50000 UNIT) CAPS capsule Take 1 capsule (50,000 Units total) by mouth every 7 (seven) days. (taking one tablet per week) walk in lab in office 1-2 weeks after completing prescription. (Patient not taking: Reported on 01/07/2021) 12 capsule 0   No facility-administered medications prior to visit.    No Known Allergies  ROS Review of Systems    Objective:    Physical Exam Vitals reviewed.  Constitutional:      General: She is not in acute distress.    Appearance: Normal appearance. She is obese. She is not ill-appearing,  toxic-appearing or diaphoretic.  HENT:     Head: Normocephalic and atraumatic.     Right Ear: External ear normal.     Left Ear: External ear normal.     Nose: Nose normal.  Neck:     Vascular: No carotid bruit.  Cardiovascular:     Rate and Rhythm: Normal rate and regular rhythm.     Pulses: Normal pulses.     Heart sounds: Normal heart sounds.  Pulmonary:     Effort: Pulmonary effort is normal.     Breath sounds: Normal breath sounds.  Abdominal:     General: There is no distension.     Palpations: Abdomen is soft. There is no mass.  Musculoskeletal:        General: Normal range of motion.     Cervical back: Normal range of motion and neck supple. No rigidity or tenderness.  Lymphadenopathy:     Cervical: No cervical adenopathy.  Skin:    General: Skin is warm.     Findings: Rash (hyperpigmented skin, moist  umbilicus area and under bilateral groin folds.) present. No erythema.    BP 120/76   Pulse 67   Temp 98 F (36.7 C)   Ht 5' 2.01" (1.575 m)   Wt 202 lb (91.6 kg)   LMP 12/24/2020   SpO2 98%   BMI 36.94 kg/m  Wt Readings from Last 3 Encounters:  01/07/21 202 lb (91.6 kg)  12/11/20 203 lb (92.1 kg)  09/25/20 212 lb 14.4 oz (96.6 kg)     Health Maintenance Due  Topic Date Due   Pneumococcal Vaccine 3-1 Years old (1 - PCV) Never done   Zoster Vaccines- Shingrix (1 of 2) Never done    There are no preventive care reminders to display for this patient.  Lab Results  Component Value Date   TSH 0.842 08/23/2020   Lab Results  Component Value Date   WBC 5.7 07/31/2020   HGB 14.1 07/31/2020   HCT 41.2 07/31/2020   MCV 94 07/31/2020   PLT 186 07/31/2020   Lab Results  Component Value Date   NA 142 07/31/2020   K 4.0 07/31/2020   CO2 21 07/31/2020   GLUCOSE 85 07/31/2020   BUN 19 07/31/2020   CREATININE 0.79 07/31/2020   BILITOT 0.8 07/31/2020   ALKPHOS 79 07/31/2020   AST 17 07/31/2020   ALT 9 07/31/2020   PROT 6.8 07/31/2020   ALBUMIN 4.4 07/31/2020   CALCIUM 9.3 07/31/2020   ANIONGAP 10 03/14/2014   Lab Results  Component Value Date   CHOL 151 06/27/2019   Lab Results  Component Value Date   HDL 65 06/27/2019   Lab Results  Component Value Date   LDLCALC 72 06/27/2019   Lab Results  Component Value Date   TRIG 73 06/27/2019   Lab Results  Component Value Date   CHOLHDL 2.3 06/27/2019   Lab Results  Component Value Date   HGBA1C 4.8 08/23/2020      Assessment & Plan:   Problem List Items Addressed This Visit       Musculoskeletal and Integument   Intertriginous candidiasis - Primary   Relevant Medications   fluconazole (DIFLUCAN) 150 MG tablet   nystatin cream (MYCOSTATIN)     Other   Vitamin D deficiency   Relevant Orders   VITAMIN D 25 Hydroxy (Vit-D Deficiency, Fractures)   Body mass index (BMI) of 36.0-36.9 in adult     Meds ordered this encounter  Medications  fluconazole (DIFLUCAN) 150 MG tablet    Sig: Take 1 tablet (150 mg total) by mouth as directed. Take one tablet by mouth on day 1. May repeat dose of one tablet in 4 weeks if not resolved.    Dispense:  2 tablet    Refill:  0   nystatin cream (MYCOSTATIN)    Sig: Apply 1 application topically 2 (two) times daily.    Dispense:  30 g    Refill:  0   Discussed preventative measures for the yeast on skin.   Red Flags discussed. The patient was given clear instructions to go to ER or return to medical center if any red flags develop, symptoms do not improve, worsen or new problems develop. They verbalized understanding.    Follow-up: Return if symptoms worsen or fail to improve, for at any time for any worsening symptoms, Go to Emergency room/ urgent care if worse.    Marcille Buffy, FNP

## 2021-01-07 NOTE — Patient Instructions (Addendum)
Intertrigo Intertrigo is skin irritation (inflammation) that happens in warm, moist areas of the body. The irritation can cause a rash and make skin raw and itchy. The rash is usually pink or red. It happens mostly between folds of skin or where skin rubs together, such as: Between the toes. In the armpits. In the groin area. Under the belly. Under the breasts. Around the butt area. This condition is not passed from person to person (is not contagious). What are the causes? Heat, moisture, rubbing, and not enough air movement. The condition can be made worse by: Sweat. Bacteria. A fungus, such as yeast. What increases the risk? Moisture in your skin folds. You are more likely to develop this condition if you: Have diabetes. Are overweight. Are not able to move around. Live in a warm and moist climate. Wear splints, braces, or other medical devices. Are not able to control your pee (urine) or poop (stool). What are the signs or symptoms? A pink or red skin rash in the skin fold or near the skin fold. Raw or scaly skin. Itching. A burning feeling. Bleeding. Leaking fluid. A bad smell. How is this treated? Cleaning and drying your skin. Taking an antibiotic medicine or using an antibiotic skin cream for a bacterial infection. Using an antifungal cream on your skin or taking pills for an infection that was caused by a fungus, such as yeast. Using a steroid ointment to stop the itching and irritation. Separating the skin fold with a clean cotton cloth to absorb moisture and allow air to flow into the area. Follow these instructions at home: Keep the affected area clean and dry. Do not scratch your skin. Stay cool as much as you can. Use an air conditioner or a fan, if you have one. Apply over-the-counter and prescription medicines only as told by your doctor. If you were prescribed an antibiotic medicine, use it as told by your doctor. Do not stop using the antibiotic even if  your condition starts to get better. Keep all follow-up visits as told by your doctor. This is important. How is this prevented?  Stay at a healthy weight. Take care of your feet. This is very important if you have diabetes. You should: Wear shoes that fit well. Keep your feet dry. Wear clean cotton or wool socks. Protect the skin in your groin and butt area as told by your doctor. To do this: Follow a regular cleaning routine. Use creams, powders, or ointments that protect your skin. Change protection pads often. Do not wear tight clothes. Wear clothes that: Are loose. Take moisture away from your body. Are made of cotton. Wear a bra that gives good support, if needed. Shower and dry yourself well after being active. Use a hair dryer on a cool setting to dry between skin folds. Keep your blood sugar under control if you have diabetes. Contact a doctor if: Your symptoms do not get better with treatment. Your symptoms get worse or they spread. You notice more redness and warmth. You have a fever. Summary Intertrigo is skin irritation that occurs when folds of skin rub together. This condition is caused by heat, moisture, and rubbing. This condition may be treated by cleaning and drying your skin and with medicines. Apply over-the-counter and prescription medicines only as told by your doctor. Keep all follow-up visits as told by your doctor. This is important. This information is not intended to replace advice given to you by your health care provider. Make sure you discuss  any questions you have with your healthcare provider. Document Revised: 04/21/2018 Document Reviewed: 04/21/2018 Elsevier Patient Education  2022 [place]. Vitamin D Deficiency Vitamin D deficiency is when your body does not have enough vitamin D. Vitamin D is important to your body because: It helps your body use other minerals. It helps to keep your bones strong and healthy. It may help to prevent some  diseases. It helps your heart and other muscles work well. Not getting enough vitamin D can make your bones soft. It can also cause otherhealth problems. What are the causes? This condition may be caused by: Not eating enough foods that contain vitamin D. Not getting enough sun. Having diseases that make it hard for your body to absorb vitamin D. Having a surgery in which a part of the stomach or a part of the small intestine is removed. Having kidney disease or liver disease. What increases the risk? You are more likely to get this condition if: You are older. You do not spend much time outdoors. You live in a nursing home. You have had broken bones. You have weak or thin bones (osteoporosis). You have a disease or condition that changes how your body absorbs vitamin D. You have dark skin. You take certain medicines. You are overweight or obese. What are the signs or symptoms? In mild cases, there may not be any symptoms. If the condition is very bad, symptoms may include: Bone pain. Muscle pain. Falling often. Broken bones caused by a minor injury. How is this treated? Treatment may include taking supplements as told by your doctor. Your doctor will tell you what dose is best for you. Supplements may include: Vitamin D. Calcium. Follow these instructions at home: Eating and drinking  Eat foods that contain vitamin D, such as: Dairy products, cereals, or juices with added vitamin D. Check the label. Fish, such as salmon or trout. Eggs. Oysters. Mushrooms. The items listed above may not be a complete list of what you can eat and drink. Contact a dietitian for more options. General instructions Take medicines and supplements only as told by your doctor. Get regular, safe exposure to natural sunlight. Do not use a tanning bed. Maintain a healthy weight. Lose weight if needed. Keep all follow-up visits as told by your doctor. This is important. How is this prevented? You  can get vitamin D by: Eating foods that naturally contain vitamin D. Eating or drinking products that have vitamin D added to them, such as cereals, juices, and milk. Taking vitamin D or a multivitamin that contains vitamin D. Being in the sun. Your body makes vitamin D when your skin is exposed to sunlight. Your body changes the sunlight into a form of the vitamin that it can use. Contact a doctor if: Your symptoms do not go away. You feel sick to your stomach (nauseous). You throw up (vomit). You poop less often than normal, or you have trouble pooping (constipation). Summary Vitamin D deficiency is when your body does not have enough vitamin D. Vitamin D helps to keep your bones strong and healthy. This condition is often treated by taking a supplement. Your doctor will tell you what dose is best for you. This information is not intended to replace advice given to you by your health care provider. Make sure you discuss any questions you have with your healthcare provider. Document Revised: 03/21/2018 Document Reviewed: 03/21/2018 Elsevier Patient Education  [place] for [employer] Calories are units of  energy. Your body needs a certain number of calories from food to keep going throughout the day. When you eat or drink more calories than your body needs, your body stores the extra calories mostly as fat. When you eat or drink fewer calories than your body needs, your body burns fat to getthe energy it needs. Calorie counting means keeping track of how many calories you eat and drink each day. Calorie counting can be helpful if you need to lose weight. If you eat fewer calories than your body needs, you should lose weight. Ask yourhealth care provider what a healthy weight is for you. For calorie counting to work, you will need to eat the right number of calories each day to lose a healthy amount of weight per week. A dietitian can help you figure out how many  calories you need in a day and will suggest ways to reach your calorie goal. A healthy amount of weight to lose each week is usually 1-2 lb (0.5-0.9 kg). This usually means that your daily calorie intake should be reduced by 500-750 calories. Eating 1,200-1,500 calories a day can help most women lose weight. Eating 1,500-1,800 calories a day can help most men lose weight. What do I need to know about calorie counting? Work with your health care provider or dietitian to determine how many calories you should get each day. To meet your daily calorie goal, you will need to: Find out how many calories are in each food that you would like to eat. Try to do this before you eat. Decide how much of the food you plan to eat. Keep a food log. Do this by writing down what you ate and how many calories it had. To successfully lose weight, it is important to balance calorie counting with ahealthy lifestyle that includes regular activity. Where do I find calorie information?  The number of calories in a food can be found on a Nutrition Facts label. If a food does not have a Nutrition Facts label, try to look up the calories onlineor ask your dietitian for help. Remember that calories are listed per serving. If you choose to have more than one serving of a food, you will have to multiply the calories per serving by the number of servings you plan to eat. For example, the label on a package of bread might say that a serving size is 1 slice and that there are 90 calories in a serving. If you eat 1 slice, you will have eaten 90 calories. If you eat 2slices, you will have eaten 180 calories. How do I keep a food log? After each time that you eat, record the following in your food log as soon as possible: What you ate. Be sure to include toppings, sauces, and other extras on the food. How much you ate. This can be measured in cups, ounces, or number of items. How many calories were in each food and drink. The total  number of calories in the food you ate. Keep your food log near you, such as in a pocket-sized notebook or on an app or website on your mobile phone. Some programs will calculate calories for you andshow you how many calories you have left to meet your daily goal. What are some portion-control tips? Know how many calories are in a serving. This will help you know how many servings you can have of a certain food. Use a measuring cup to measure serving sizes. You could also try weighing  out portions on a kitchen scale. With time, you will be able to estimate serving sizes for some foods. Take time to put servings of different foods on your favorite plates or in your favorite bowls and cups so you know what a serving looks like. Try not to eat straight from a food's packaging, such as from a bag or box. Eating straight from the package makes it hard to see how much you are eating and can lead to overeating. Put the amount you would like to eat in a cup or on a plate to make sure you are eating the right portion. Use smaller plates, glasses, and bowls for smaller portions and to prevent overeating. Try not to multitask. For example, avoid watching TV or using your computer while eating. If it is time to eat, sit down at a table and enjoy your food. This will help you recognize when you are full. It will also help you be more mindful of what and how much you are eating. What are tips for following this plan? Reading food labels Check the calorie count compared with the serving size. The serving size may be smaller than what you are used to eating. Check the source of the calories. Try to choose foods that are high in protein, fiber, and vitamins, and low in saturated fat, trans fat, and sodium. Shopping Read nutrition labels while you shop. This will help you make healthy decisions about which foods to buy. Pay attention to nutrition labels for low-fat or fat-free foods. These foods sometimes have the same  number of calories or more calories than the full-fat versions. They also often have added sugar, starch, or salt to make up for flavor that was removed with the fat. Make a grocery list of lower-calorie foods and stick to it. Cooking Try to cook your favorite foods in a healthier way. For example, try baking instead of frying. Use low-fat dairy products. Meal planning Use more fruits and vegetables. One-half of your plate should be fruits and vegetables. Include lean proteins, such as chicken, [place], and fish. Lifestyle Each week, aim to do one of the following: 150 minutes of moderate exercise, such as walking. 75 minutes of vigorous exercise, such as running. General information Know how many calories are in the foods you eat most often. This will help you calculate calorie counts faster. Find a way of tracking calories that works for you. Get creative. Try different apps or programs if writing down calories does not work for you. What foods should I eat?  Eat nutritious foods. It is better to have a nutritious, high-calorie food, such as an avocado, than a food with few nutrients, such as a bag of potato chips. Use your calories on foods and drinks that will fill you up and will not leave you hungry soon after eating. Examples of foods that fill you up are nuts and nut butters, vegetables, lean proteins, and high-fiber foods such as whole grains. High-fiber foods are foods with more than 5 g of fiber per serving. Pay attention to calories in drinks. Low-calorie drinks include water and unsweetened drinks. The items listed above may not be a complete list of foods and beverages you can eat. Contact a dietitian for more information. What foods should I limit? Limit foods or drinks that are not good sources of vitamins, minerals, or protein or that are high in unhealthy fats. These include: Candy. Other sweets. Sodas, specialty coffee drinks, alcohol, and juice. The items listed above  may not be a complete list of foods and beverages you should avoid. Contact a dietitian for more information. How do I count calories when eating out? Pay attention to portions. Often, portions are much larger when eating out. Try these tips to keep portions smaller: Consider sharing a meal instead of getting your own. If you get your own meal, eat only half of it. Before you start eating, ask for a container and put half of your meal into it. When available, consider ordering smaller portions from the menu instead of full portions. Pay attention to your food and drink choices. Knowing the way food is cooked and what is included with the meal can help you eat fewer calories. If calories are listed on the menu, choose the lower-calorie options. Choose dishes that include vegetables, fruits, whole grains, low-fat dairy products, and lean proteins. Choose items that are boiled, broiled, grilled, or steamed. Avoid items that are buttered, battered, fried, or served with cream sauce. Items labeled as crispy are usually fried, unless stated otherwise. Choose water, low-fat milk, unsweetened iced tea, or other drinks without added sugar. If you want an alcoholic beverage, choose a lower-calorie option, such as a glass of wine or light beer. Ask for dressings, sauces, and syrups on the side. These are usually high in calories, so you should limit the amount you eat. If you want a salad, choose a garden salad and ask for grilled meats. Avoid extra toppings such as bacon, cheese, or fried items. Ask for the dressing on the side, or ask for olive oil and vinegar or lemon to use as dressing. Estimate how many servings of a food you are given. Knowing serving sizes will help you be aware of how much food you are eating at restaurants. Where to find more information Centers for Disease Control and Prevention: http://www.wolf.info/ U.S. Department of Agriculture: http://www.wilson-mendoza.org/ Summary Calorie counting means keeping track of  how many calories you eat and drink each day. If you eat fewer calories than your body needs, you should lose weight. A healthy amount of weight to lose per week is usually 1-2 lb (0.5-0.9 kg). This usually means reducing your daily calorie intake by 500-750 calories. The number of calories in a food can be found on a Nutrition Facts label. If a food does not have a Nutrition Facts label, try to look up the calories online or ask your dietitian for help. Use smaller plates, glasses, and bowls for smaller portions and to prevent overeating. Use your calories on foods and drinks that will fill you up and not leave you hungry shortly after a meal. This information is not intended to replace advice given to you by your health care provider. Make sure you discuss any questions you have with your healthcare provider. Document Revised: 08/24/2019 Document Reviewed: 08/24/2019 Elsevier Patient Education  2022 [place] and Cholesterol Restricted Eating Plan Getting too much fat and cholesterol in your diet may cause health problems. Choosing the right foods helps keep your fat and cholesterol at normal levels.This can keep you from getting certain diseases. Your doctor may recommend an eating plan that includes: Total fat: ______% or less of total calories a day. Saturated fat: ______% or less of total calories a day. Cholesterol: less than _________mg a day. Fiber: ______g a day. What are tips for following this plan? Meal planning At meals, divide your plate into four equal parts: Fill one-half of your plate with vegetables and green salads. Fill one-fourth of  your plate with whole grains. Fill one-fourth of your plate with low-fat (lean) protein foods. Eat fish that is high in omega-3 fats at least two times a week. This includes mackerel, tuna, sardines, and salmon. Eat foods that are high in fiber, such as whole grains, beans, apples, broccoli, carrots, peas, and barley. General  tips  Work with your doctor to lose weight if you need to. Avoid: Foods with added sugar. Fried foods. Foods with partially hydrogenated oils. Limit alcohol intake to no more than 1 drink a day for nonpregnant women and 2 drinks a day for men. One drink equals 12 oz of beer, 5 oz of wine, or 1 oz of hard liquor.  Reading food labels Check food labels for: Trans fats. Partially hydrogenated oils. Saturated fat (g) in each serving. Cholesterol (mg) in each serving. Fiber (g) in each serving. Choose foods with healthy fats, such as: Monounsaturated fats. Polyunsaturated fats. Omega-3 fats. Choose grain products that have whole grains. Look for the word "whole" as the first word in the ingredient list. Cooking Cook foods using low-fat methods. These include baking, boiling, grilling, and broiling. Eat more home-cooked foods. Eat at restaurants and buffets less often. Avoid cooking using saturated fats, such as butter, cream, palm oil, palm kernel oil, and coconut oil. Recommended foods  Fruits All fresh, canned (in natural juice), or frozen fruits. Vegetables Fresh or frozen vegetables (raw, steamed, roasted, or grilled). Green salads. Grains Whole grains, such as whole wheat or whole grain breads, crackers, cereals, and pasta. Unsweetened oatmeal, bulgur, barley, quinoa, or brown rice. Corn or whole wheat flour tortillas. Meats and other protein foods Ground beef (85% or leaner), grass-fed beef, or beef trimmed of fat. Skinless chicken or [place]. Ground chicken or [place]. Pork trimmed of fat. All fish and seafood. Egg whites. Dried beans, peas, or lentils. Unsalted nuts or seeds. Unsalted canned beans. Nut butters without added sugar or oil. Dairy Low-fat or nonfat dairy products, such as skim or 1% milk, 2% or reduced-fat cheeses, low-fat and fat-free ricotta or cottage cheese, or plain low-fat and nonfat yogurt. Fats and oils Tub margarine without trans fats. Light or  reduced-fat mayonnaise and salad dressings. Avocado. Olive, canola, sesame, or safflower oils. The items listed above may not be a complete list of foods and beverages youcan eat. Contact a dietitian for more information. Foods to avoid Fruits Canned fruit in heavy syrup. Fruit in cream or butter sauce. Fried fruit. Vegetables Vegetables cooked in cheese, cream, or butter sauce. Fried vegetables. Grains White bread. White pasta. White rice. Cornbread. Bagels, pastries, and croissants. Crackers and snack foods that contain trans fat and hydrogenated oils. Meats and other protein foods Fatty cuts of meat. Ribs, chicken wings, bacon, sausage, bologna, salami, chitterlings, fatback, hot dogs, bratwurst, and packaged lunch meats. Liver and organ meats. Whole eggs and egg yolks. Chicken and [place] with skin. Fried meat. Dairy Whole or 2% milk, cream, half-and-half, and cream cheese. Whole milk cheeses. Whole-fat or sweetened yogurt. Full-fat cheeses. Nondairy creamers and whipped toppings. Processed cheese, cheese spreads, and cheese curds. Beverages Alcohol. Sugar-sweetened drinks such as sodas, lemonade, and fruit drinks. Fats and oils Butter, stick margarine, lard, shortening, ghee, or bacon fat. Coconut, palm kernel, and palm oils. Sweets and desserts Corn syrup, sugars, honey, and molasses. Candy. Jam and jelly. Syrup. Sweetened cereals. Cookies, pies, cakes, donuts, muffins, and ice cream. The items listed above may not be a complete list of foods and beverages youshould avoid. Contact a dietitian for  more information. Summary Choosing the right foods helps keep your fat and cholesterol at normal levels. This can keep you from getting certain diseases. At meals, fill one-half of your plate with vegetables and green salads. Eat high-fiber foods, like whole grains, beans, apples, carrots, peas, and barley. Limit added sugar, saturated fats, alcohol, and fried foods. This information is not  intended to replace advice given to you by your health care provider. Make sure you discuss any questions you have with your healthcare provider. Document Revised: 11/15/2019 Document Reviewed: 11/15/2019 Elsevier Patient Education  2022 [employer].

## 2021-01-09 NOTE — Progress Notes (Signed)
Vitamin D is low end normal. Recommend now taking Vitamin D 3 over the counter at 4,000 international units by mouth once daily. Can recheck lab Vitamin D in 4-6 months.

## 2021-01-22 ENCOUNTER — Ambulatory Visit: Payer: BC Managed Care – PPO | Admitting: Dietician

## 2021-01-28 ENCOUNTER — Other Ambulatory Visit: Payer: Self-pay | Admitting: Adult Health

## 2021-01-28 DIAGNOSIS — I10 Essential (primary) hypertension: Secondary | ICD-10-CM

## 2021-01-29 ENCOUNTER — Other Ambulatory Visit: Payer: Self-pay | Admitting: Adult Health

## 2021-01-29 DIAGNOSIS — I10 Essential (primary) hypertension: Secondary | ICD-10-CM

## 2021-01-30 MED ORDER — HYDROCHLOROTHIAZIDE 12.5 MG PO TABS: 12.5000 mg | ORAL_TABLET | Freq: Every day | ORAL | 0 refills | Status: DC

## 2021-01-30 MED ORDER — BUPROPION HCL ER (XL) 150 MG PO TB24: 150.0000 mg | ORAL_TABLET | Freq: Every day | ORAL | 0 refills | Status: DC

## 2021-03-05 ENCOUNTER — Ambulatory Visit: Payer: BC Managed Care – PPO | Admitting: Dietician

## 2021-03-19 ENCOUNTER — Encounter: Payer: Self-pay | Admitting: Dietician

## 2021-03-19 NOTE — Progress Notes (Signed)
Have not heard back from patient to reschedule her cancelled appointment from 03/02/21. Sent notification to referring provider.

## 2021-04-22 ENCOUNTER — Ambulatory Visit: Payer: BC Managed Care – PPO | Admitting: Adult Health

## 2021-04-28 ENCOUNTER — Other Ambulatory Visit: Payer: Self-pay | Admitting: Family

## 2021-04-28 DIAGNOSIS — I10 Essential (primary) hypertension: Secondary | ICD-10-CM

## 2021-04-29 ENCOUNTER — Other Ambulatory Visit: Payer: Self-pay

## 2021-04-29 ENCOUNTER — Encounter: Payer: Self-pay | Admitting: Internal Medicine

## 2021-04-29 ENCOUNTER — Ambulatory Visit (INDEPENDENT_AMBULATORY_CARE_PROVIDER_SITE_OTHER): Payer: BC Managed Care – PPO | Admitting: Internal Medicine

## 2021-04-29 VITALS — BP 118/68 | HR 70 | Temp 97.8°F | Resp 16 | Ht 62.0 in | Wt 208.0 lb

## 2021-04-29 DIAGNOSIS — R5383 Other fatigue: Secondary | ICD-10-CM

## 2021-04-29 DIAGNOSIS — Z23 Encounter for immunization: Secondary | ICD-10-CM | POA: Diagnosis not present

## 2021-04-29 DIAGNOSIS — B372 Candidiasis of skin and nail: Secondary | ICD-10-CM | POA: Diagnosis not present

## 2021-04-29 DIAGNOSIS — I10 Essential (primary) hypertension: Secondary | ICD-10-CM

## 2021-04-29 DIAGNOSIS — E559 Vitamin D deficiency, unspecified: Secondary | ICD-10-CM

## 2021-04-29 DIAGNOSIS — Z9884 Bariatric surgery status: Secondary | ICD-10-CM

## 2021-04-29 DIAGNOSIS — R400 Somnolence: Secondary | ICD-10-CM

## 2021-04-29 LAB — CBC WITH DIFFERENTIAL/PLATELET
Basophils Absolute: 0 10*3/uL (ref 0.0–0.1)
Basophils Relative: 0.7 % (ref 0.0–3.0)
Eosinophils Absolute: 0.1 10*3/uL (ref 0.0–0.7)
Eosinophils Relative: 1.5 % (ref 0.0–5.0)
HCT: 39.2 % (ref 36.0–46.0)
Hemoglobin: 13.2 g/dL (ref 12.0–15.0)
Lymphocytes Relative: 39.5 % (ref 12.0–46.0)
Lymphs Abs: 1.9 10*3/uL (ref 0.7–4.0)
MCHC: 33.7 g/dL (ref 30.0–36.0)
MCV: 95.9 fl (ref 78.0–100.0)
Monocytes Absolute: 0.4 10*3/uL (ref 0.1–1.0)
Monocytes Relative: 8 % (ref 3.0–12.0)
Neutro Abs: 2.5 10*3/uL (ref 1.4–7.7)
Neutrophils Relative %: 50.3 % (ref 43.0–77.0)
Platelets: 170 10*3/uL (ref 150.0–400.0)
RBC: 4.09 Mil/uL (ref 3.87–5.11)
RDW: 12.5 % (ref 11.5–15.5)
WBC: 4.9 10*3/uL (ref 4.0–10.5)

## 2021-04-29 LAB — COMPREHENSIVE METABOLIC PANEL
ALT: 11 U/L (ref 0–35)
AST: 17 U/L (ref 0–37)
Albumin: 4 g/dL (ref 3.5–5.2)
Alkaline Phosphatase: 61 U/L (ref 39–117)
BUN: 14 mg/dL (ref 6–23)
CO2: 29 mEq/L (ref 19–32)
Calcium: 9 mg/dL (ref 8.4–10.5)
Chloride: 104 mEq/L (ref 96–112)
Creatinine, Ser: 0.63 mg/dL (ref 0.40–1.20)
GFR: 110.89 mL/min (ref >60.00)
Glucose, Bld: 83 mg/dL (ref 70–99)
Potassium: 3.5 mEq/L (ref 3.5–5.1)
Sodium: 141 mEq/L (ref 135–145)
Total Bilirubin: 0.8 mg/dL (ref 0.2–1.2)
Total Protein: 6.4 g/dL (ref 6.0–8.3)

## 2021-04-29 LAB — TSH: TSH: 1.15 u[IU]/mL (ref 0.35–5.50)

## 2021-04-29 LAB — VITAMIN B12: Vitamin B-12: 541 pg/mL (ref 211–911)

## 2021-04-29 MED ORDER — NYSTATIN 100000 UNIT/GM EX CREA: 1.0000 "application " | TOPICAL_CREAM | Freq: Two times a day (BID) | CUTANEOUS | 0 refills | Status: DC

## 2021-04-29 MED ORDER — NYSTATIN 100000 UNIT/GM EX POWD: 1.0000 "application " | Freq: Two times a day (BID) | CUTANEOUS | 0 refills | Status: DC

## 2021-04-29 MED ORDER — FLUCONAZOLE 150 MG PO TABS: 150.0000 mg | ORAL_TABLET | ORAL | 0 refills | Status: DC

## 2021-04-29 NOTE — Progress Notes (Signed)
Patient ID: Lauren [name], female   DOB: March 04, 1981, 40 y.o.   MRN: [id number]   Subjective:    Patient ID: Lauren [name], female    DOB: [birth year], 40 y.o.   MRN: [id number]  This visit occurred during the SARS-CoV-2 public health emergency.  Safety protocols were in place, including screening questions prior to the visit, additional usage of staff PPE, and extensive cleaning of exam room while observing appropriate contact time as indicated for disinfecting solutions.   Patient here for work in appt.   Chief Complaint  Patient presents with   skin irritation   .   HPI Work in with concerns regarding persistent yeast infection.  History of bariatric surgery.  Has lost weight.  Reports has had continued problems with rash/yeast - lower abdomen.  Previously saw [place].  Was given diflucan and nystatin cream.  Helps and then returns.  Discussed keeping the area dry.  No chest pain reported.  Breathing stable.  No nausea or vomiting.  Does report increased fatigue.  Daytime somnolence. Previous sleep apnea.  Has not been checked since lost weight.  Not using cpap.  Taking sublingual B12.    Past Medical History:  Diagnosis Date   Anxiety    Edema    ankle swelling   Past Surgical History:  Procedure Laterality Date   BARIATRIC SURGERY  02/2014   Gastric Sleeve   BASAL CELL CARCINOMA EXCISION  10/2015   CHOLECYSTECTOMY  02/2014   WISDOM TOOTH EXTRACTION     Family History  Problem Relation Age of Onset   Migraines Mother    Hypertension Mother    Hyperlipidemia Mother    Depression Mother    Sleep apnea Mother    Breast cancer Maternal Grandmother    Cancer Maternal Grandmother        breast   Asthma Daughter    Allergies Daughter    Asthma Son    Allergies Son    Cancer Maternal Grandfather        pancreatic    Social History   Socioeconomic History   Marital status: Single    Spouse name: Not on file   Number of children: Not on file   Years of  education: Not on file   Highest education level: Not on file  Occupational History   Not on file  Tobacco Use   Smoking status: Never   Smokeless tobacco: Never  Substance and Sexual Activity   Alcohol use: No    Alcohol/week: 0.0 standard drinks   Drug use: No   Sexual activity: Yes    Birth control/protection: I.U.D.    Comment: mirena  Other Topics Concern   Not on file  Social History Narrative   Not on file   Social Determinants of Health   Financial Resource Strain: Not on file  Food Insecurity: Not on file  Transportation Needs: Not on file  Physical Activity: Not on file  Stress: Not on file  Social Connections: Not on file     Review of Systems  Constitutional:  Positive for fatigue. Negative for appetite change and unexpected weight change.  HENT:  Negative for congestion and sinus pressure.   Respiratory:  Negative for cough, chest tightness and shortness of breath.   Cardiovascular:  Negative for chest pain and palpitations.  Gastrointestinal:  Negative for abdominal pain, diarrhea, nausea and vomiting.  Genitourinary:  Negative for difficulty urinating and dysuria.  Musculoskeletal:  Negative for joint swelling and myalgias.  Skin:  Positive for rash. Negative for color change.  Neurological:  Negative for dizziness, light-headedness and headaches.  Psychiatric/Behavioral:  Negative for agitation and dysphoric mood.       Objective:     BP 118/68   Pulse 70   Temp 97.8 F (36.6 C)   Resp 16   Ht 5\' 2"  (1.575 m)   Wt 208 lb (94.3 kg)   SpO2 99%   BMI 38.04 kg/m  Wt Readings from Last 3 Encounters:  04/29/21 208 lb (94.3 kg)  01/07/21 202 lb (91.6 kg)  12/11/20 203 lb (92.1 kg)    Physical Exam Vitals reviewed.  Constitutional:      General: She is not in acute distress.    Appearance: Normal appearance.  HENT:     Head: Normocephalic and atraumatic.     Right Ear: External ear normal.     Left Ear: External ear normal.  Eyes:      General: No scleral icterus.       Right eye: No discharge.        Left eye: No discharge.     Conjunctiva/sclera: Conjunctivae normal.  Neck:     Thyroid: No thyromegaly.  Cardiovascular:     Rate and Rhythm: Normal rate and regular rhythm.  Pulmonary:     Effort: No respiratory distress.     Breath sounds: Normal breath sounds. No wheezing.  Abdominal:     General: Bowel sounds are normal.     Palpations: Abdomen is soft.     Tenderness: There is no abdominal tenderness.  Musculoskeletal:        General: No swelling or tenderness.     Cervical back: Neck supple. No tenderness.  Lymphadenopathy:     Cervical: No cervical adenopathy.  Skin:    Comments: Rash / erythema - lower abdomen   Neurological:     Mental Status: She is alert.  Psychiatric:        Mood and Affect: Mood normal.        Behavior: Behavior normal.     Outpatient Encounter Medications as of 04/29/2021  Medication Sig   nystatin (MYCOSTATIN/NYSTOP) powder Apply 1 application topically 2 (two) times daily.   fluconazole (DIFLUCAN) 150 MG tablet Take 1 tablet (150 mg total) by mouth as directed. Take one tablet by mouth x 1.  May repeat x 1 in 3 days.   nystatin cream (MYCOSTATIN) Apply 1 application topically 2 (two) times daily.   [DISCONTINUED] buPROPion (WELLBUTRIN XL) 150 MG 24 hr tablet Take 1 tablet (150 mg total) by mouth daily.   [DISCONTINUED] fluconazole (DIFLUCAN) 150 MG tablet Take 1 tablet (150 mg total) by mouth as directed. Take one tablet by mouth on day 1. May repeat dose of one tablet in 4 weeks if not resolved.   [DISCONTINUED] hydrochlorothiazide (HYDRODIURIL) 12.5 MG tablet Take 1 tablet (12.5 mg total) by mouth daily.   [DISCONTINUED] nystatin cream (MYCOSTATIN) Apply 1 application topically 2 (two) times daily.   No facility-administered encounter medications on file as of 04/29/2021.     Lab Results  Component Value Date   WBC 4.9 04/29/2021   HGB 13.2 04/29/2021   HCT 39.2  04/29/2021   PLT 170.0 04/29/2021   GLUCOSE 83 04/29/2021   CHOL 151 06/27/2019   TRIG 73 06/27/2019   HDL 65 06/27/2019   LDLCALC 72 06/27/2019   ALT 11 04/29/2021   AST 17 04/29/2021   NA 141 04/29/2021   K 3.5 04/29/2021   CL  104 04/29/2021   CREATININE 0.63 04/29/2021   BUN 14 04/29/2021   CO2 29 04/29/2021   TSH 1.15 04/29/2021   HGBA1C 4.8 08/23/2020    US Abdomen Complete  Result Date: 03/04/2017 CLINICAL DATA:  40 year old female with history of right upper quadrant abdominal pain for the past 5 months. EXAM: ABDOMEN ULTRASOUND COMPLETE COMPARISON:  Abdominal ultrasound 02/12/2017. FINDINGS: Gallbladder: Status post cholecystectomy. Common bile duct: Diameter: 5.8 mm in the porta hepatis. Liver: No focal lesion identified. Mild diffuse increased echogenicity, suggestive of hepatic steatosis. IVC: No abnormality visualized. Pancreas: Visualized portion unremarkable. Spleen: Size and appearance within normal limits. Right Kidney: Length: 9.9 cm. Echogenicity within normal limits. No mass or hydronephrosis visualized. Left Kidney: Length: 10.1 cm. Echogenicity within normal limits. No mass or hydronephrosis visualized. Abdominal aorta: No aneurysm visualized. Other findings: None. IMPRESSION: 1. No acute findings in the abdomen to account for the patient's symptoms. 2. Status post cholecystectomy. 3. Increased echogenicity of the hepatic parenchyma, suggestive of hepatic steatosis. Electronically Signed   By: Vinnie Langton M.D.   On: 03/04/2017 14:40       Assessment & Plan:   Problem List Items Addressed This Visit     Bariatric surgery status    S/p bariatric surgery.  Check cbc and B12.       Essential (primary) hypertension    Blood pressure as outlined.  Continue hctz.       Intertriginous candidiasis    Discussed the need to keep the area dry.  Discussed alternating nystatin cream and powder.  Diflucan given if needed, but see if can control with topical treatment.   If persistent problems, may need dermatology referral.      Relevant Medications   nystatin cream (MYCOSTATIN)   nystatin (MYCOSTATIN/NYSTOP) powder   fluconazole (DIFLUCAN) 150 MG tablet   Other fatigue - Primary    Persistent fatigue as outlined.  History of sleep apnea.  No longer using cpap - since lost weight.  Daytime somnolence.  Increased fatigue.  Refer back to pulmonary for further evaluation and testing - sleep apnea.       Relevant Orders   CBC with Differential/Platelet (Completed)   TSH (Completed)   Comprehensive metabolic panel (Completed)   Vitamin B12 (Completed)   Vitamin D deficiency    Vitamin D level just check - wnl.       Other Visit Diagnoses     Need for immunization against influenza       Relevant Orders   Flu Vaccine QUAD 73mo+IM (Fluarix, Fluzone & Alfiuria Quad PF) (Completed)        Einar Pheasant, MD

## 2021-04-30 NOTE — Telephone Encounter (Signed)
Yes.  We were discussing her increased fatigue, daytime somnolence and has a history of sleep apnea.  Needs referral to pulmonary to discuss further w/up and see if they agree with home sleep test.

## 2021-05-01 MED ORDER — HYDROCHLOROTHIAZIDE 12.5 MG PO TABS: 12.5000 mg | ORAL_TABLET | Freq: Every day | ORAL | 0 refills | Status: DC

## 2021-05-01 MED ORDER — BUPROPION HCL ER (XL) 150 MG PO TB24: 150.0000 mg | ORAL_TABLET | Freq: Every day | ORAL | 0 refills | Status: DC

## 2021-05-01 NOTE — Telephone Encounter (Signed)
Referral placed to pulmonary for sleep consult. Patient is aware. Also, sent in refill per patient request. Last visit with PCP 12/2020

## 2021-05-04 ENCOUNTER — Encounter: Payer: Self-pay | Admitting: Internal Medicine

## 2021-05-04 NOTE — Assessment & Plan Note (Signed)
S/p bariatric surgery.  Check cbc and B12.

## 2021-05-04 NOTE — Assessment & Plan Note (Signed)
Persistent fatigue as outlined.  History of sleep apnea.  No longer using cpap - since lost weight.  Daytime somnolence.  Increased fatigue.  Refer back to pulmonary for further evaluation and testing - sleep apnea.

## 2021-05-04 NOTE — Assessment & Plan Note (Signed)
Blood pressure as outlined.  Continue hctz.

## 2021-05-04 NOTE — Assessment & Plan Note (Signed)
Vitamin D level just check - wnl.

## 2021-05-04 NOTE — Assessment & Plan Note (Signed)
Discussed the need to keep the area dry.  Discussed alternating nystatin cream and powder.  Diflucan given if needed, but see if can control with topical treatment.  If persistent problems, may need dermatology referral.

## 2021-06-02 ENCOUNTER — Other Ambulatory Visit: Payer: Self-pay | Admitting: Adult Health

## 2021-06-02 DIAGNOSIS — Z1231 Encounter for screening mammogram for malignant neoplasm of breast: Secondary | ICD-10-CM

## 2021-06-04 ENCOUNTER — Other Ambulatory Visit: Payer: Self-pay

## 2021-06-04 ENCOUNTER — Encounter: Payer: Self-pay | Admitting: Pulmonary Disease

## 2021-06-04 ENCOUNTER — Ambulatory Visit (INDEPENDENT_AMBULATORY_CARE_PROVIDER_SITE_OTHER): Payer: BC Managed Care – PPO | Admitting: Pulmonary Disease

## 2021-06-04 VITALS — BP 118/86 | HR 86 | Temp 98.0°F | Ht 62.0 in | Wt 208.4 lb

## 2021-06-04 DIAGNOSIS — Z03818 Encounter for observation for suspected exposure to other biological agents ruled out: Secondary | ICD-10-CM | POA: Diagnosis not present

## 2021-06-04 DIAGNOSIS — R5383 Other fatigue: Secondary | ICD-10-CM

## 2021-06-04 DIAGNOSIS — Z20822 Contact with and (suspected) exposure to covid-19: Secondary | ICD-10-CM | POA: Diagnosis not present

## 2021-06-04 DIAGNOSIS — F419 Anxiety disorder, unspecified: Secondary | ICD-10-CM | POA: Diagnosis not present

## 2021-06-04 DIAGNOSIS — R0683 Snoring: Secondary | ICD-10-CM | POA: Diagnosis not present

## 2021-06-04 DIAGNOSIS — F5105 Insomnia due to other mental disorder: Secondary | ICD-10-CM | POA: Diagnosis not present

## 2021-06-04 NOTE — Progress Notes (Signed)
Lauren [name], Critical Care, and Sleep Medicine  Chief Complaint  Patient presents with   Consult    Has a HX of OSA, tired all the tim.     Past Surgical History:  She  has a past surgical history that includes Cholecystectomy (02/2014); Bariatric Surgery (02/2014); Excision basal cell carcinoma (10/2015); and Wisdom tooth extraction.  Past Medical History:  Anxiety  Constitutional:  BP 118/86 (BP Location: Left Arm, Patient Position: Sitting, Cuff Size: Normal)   Pulse 86   Temp 98 F (36.7 C) (Oral)   Ht 5\' 2"  (1.575 m)   Wt 208 lb 6.4 oz (94.5 kg)   SpO2 98%   BMI 38.12 kg/m   Brief Summary:  Lauren [name] is a 40 y.o. female with fatigue.      Subjective:   She has history of sleep apnea and was on CPAP.  She had bariatric surgery in 2015.  Prior to surgery she weighed 235 lbs.  After she lost weight about 45 lbs, she stopped using CPAP.    Since then she has gained about 16 lbs.  She has been feeling more fatigued recently.  She has trouble with anxiety and has lots of random thoughts prior to going to sleep.  She is as restless sleeper.  She wakes up frequently and then has trouble falling back to sleep because she feels anxious.    She goes to sleep at 10 pm.  She falls asleep after an hour.  She wakes up several times, but unsure why.  She gets out of bed at 645 am.  She feels tired in the morning.  She sometimes gets morning headache.  She does not use anything to help her fall sleep or stay awake.  She denies sleep walking, sleep talking, bruxism, or nightmares.  There is no history of restless legs.  She denies sleep hallucinations, sleep paralysis, or cataplexy.  The Epworth score is 8 out of 24.   Physical Exam:   Appearance - well kempt   ENMT - no sinus tenderness, no oral exudate, no LAN, Mallampati 4 airway, no stridor, high arched palate  Respiratory - equal breath sounds bilaterally, no wheezing or rales  CV - s1s2 regular rate and  rhythm, no murmurs  Ext - no clubbing, no edema  Skin - no rashes  Psych - normal mood and affect   Sleep Tests:    Social History:  She  reports that she has never smoked. She has never used smokeless tobacco. She reports that she does not drink alcohol and does not use drugs.  Family History:  Her family history includes Allergies in her daughter and son; Asthma in her daughter and son; Breast cancer in her maternal grandmother; Cancer in her maternal grandfather and maternal grandmother; Depression in her mother; Hyperlipidemia in her mother; Hypertension in her mother; Migraines in her mother; Sleep apnea in her mother.    Discussion:  She has history of sleep apnea.  This improved after she had bariatric surgery and lost significant weight.  She has recurrence of sleep disruption and daytime fatigue with sleepiness since she regained some weight.  I am concerned she could have obstructive sleep apnea again.  She does have snoring, sleep disruption, and apnea.  Her BMI is > 35.  She has history of mood disorder with anxiety and depression.  Assessment/Plan:   Snoring with excessive daytime sleepiness. - will need to arrange for a home sleep study  Insomnia with anxiety and  depression. - discussed stimulus control, sleep restriction and relaxation techniques - reviewed proper sleep hygiene  Obesity. - discussed how weight can impact sleep and risk for sleep disordered breathing - discussed options to assist with weight loss: combination of diet modification, cardiovascular and strength training exercises  Cardiovascular risk. - had an extensive discussion regarding the adverse health consequences related to untreated sleep disordered breathing - specifically discussed the risks for hypertension, coronary artery disease, cardiac dysrhythmias, cerebrovascular disease, and diabetes - lifestyle modification discussed  Safe driving practices. - discussed how sleep disruption can  increase risk of accidents, particularly when driving - safe driving practices were discussed  Therapies for obstructive sleep apnea. - if the sleep study shows significant sleep apnea, then various therapies for treatment were reviewed: CPAP, oral appliance, and surgical interventions  Time Spent Involved in Patient Care on Day of Examination:  34 minutes  Follow up:   Patient Instructions  Will arrange for home sleep study Will call to arrange for follow up after sleep study reviewed  Medication List:   Allergies as of 06/04/2021   No Known Allergies      Medication List        Accurate as of June 04, 2021 10:03 AM. If you have any questions, ask your nurse or doctor.          STOP taking these medications    fluconazole 150 MG tablet Commonly known as: DIFLUCAN Stopped by: Chesley Mires, MD   nystatin cream Commonly known as: MYCOSTATIN Stopped by: Chesley Mires, MD   nystatin powder Commonly known as: MYCOSTATIN/NYSTOP Stopped by: Chesley Mires, MD       TAKE these medications    buPROPion 150 MG 24 hr tablet Commonly known as: WELLBUTRIN XL Take 1 tablet (150 mg total) by mouth daily.   hydrochlorothiazide 12.5 MG tablet Commonly known as: HYDRODIURIL Take 1 tablet (12.5 mg total) by mouth daily.        Signature:  Chesley Mires, MD [place] Pager - [phone] 06/04/2021, 10:03 AM

## 2021-06-04 NOTE — Patient Instructions (Signed)
Will arrange for home sleep study Will call to arrange for follow up after sleep study reviewed  

## 2021-06-27 ENCOUNTER — Ambulatory Visit
Admission: RE | Admit: 2021-06-27 | Discharge: 2021-06-27 | Disposition: A | Discharge status: Home / Self Care | Payer: BC Managed Care – PPO | Source: Ambulatory Visit | Attending: Adult Health | Admitting: Adult Health

## 2021-06-27 ENCOUNTER — Other Ambulatory Visit: Payer: Self-pay

## 2021-06-27 DIAGNOSIS — Z1231 Encounter for screening mammogram for malignant neoplasm of breast: Secondary | ICD-10-CM | POA: Insufficient documentation

## 2021-06-30 ENCOUNTER — Other Ambulatory Visit: Payer: Self-pay | Admitting: Adult Health

## 2021-06-30 DIAGNOSIS — R928 Other abnormal and inconclusive findings on diagnostic imaging of breast: Secondary | ICD-10-CM

## 2021-06-30 DIAGNOSIS — N6320 Unspecified lump in the left breast, unspecified quadrant: Secondary | ICD-10-CM

## 2021-06-30 NOTE — Progress Notes (Signed)
Further evaluation of left breast - diagnostic mammogram ordered and left breast ultrasound ordered as well. Patient should be hearing from breast center.

## 2021-06-30 NOTE — Progress Notes (Signed)
diagnosti

## 2021-07-01 ENCOUNTER — Ambulatory Visit: Payer: BC Managed Care – PPO | Admitting: Adult Health

## 2021-07-13 ENCOUNTER — Encounter: Payer: Self-pay | Admitting: Pulmonary Disease

## 2021-07-14 ENCOUNTER — Ambulatory Visit
Admission: RE | Admit: 2021-07-14 | Discharge: 2021-07-14 | Disposition: A | Discharge status: Home / Self Care | Payer: BC Managed Care – PPO | Source: Ambulatory Visit | Attending: Adult Health | Admitting: Adult Health

## 2021-07-14 ENCOUNTER — Other Ambulatory Visit: Payer: Self-pay

## 2021-07-14 DIAGNOSIS — N6320 Unspecified lump in the left breast, unspecified quadrant: Secondary | ICD-10-CM

## 2021-07-14 DIAGNOSIS — R922 Inconclusive mammogram: Secondary | ICD-10-CM | POA: Diagnosis not present

## 2021-07-14 DIAGNOSIS — R928 Other abnormal and inconclusive findings on diagnostic imaging of breast: Secondary | ICD-10-CM | POA: Diagnosis not present

## 2021-07-14 NOTE — Progress Notes (Signed)
  °  RECOMMENDATION: Bilateral screening mammogram in 1 year when due.  I have discussed the findings and recommendations with the patient. If applicable, a reminder letter will be sent to the patient regarding the next appointment.  BI-RADS CATEGORY  1: Negative.   Electronically Signed   By: Claudie Revering M.D.   On: 07/14/2021 11:54

## 2021-07-14 NOTE — Progress Notes (Signed)
IMPRESSION: No evidence of malignancy. The recently suspected left breast mass is a normal island of fibroglandular tissue  RECOMMENDATION: Bilateral screening mammogram in 1 year when due.  I have discussed the findings and recommendations with the patient. If applicable, a reminder letter will be sent to the patient regarding the next appointment.  BI-RADS CATEGORY  1: Negative.   Electronically Signed   By: Claudie Revering M.D.   On: 07/14/2021 11:54

## 2021-07-14 NOTE — Telephone Encounter (Signed)
Anita, please advise. Thanks 

## 2021-07-16 NOTE — Telephone Encounter (Signed)
I have spoke with the patient and she is scheduled to pick up HST machine on 07/22/21 @ 2:00pm

## 2021-07-22 ENCOUNTER — Ambulatory Visit: Payer: BC Managed Care – PPO

## 2021-07-22 ENCOUNTER — Other Ambulatory Visit: Payer: Self-pay

## 2021-07-22 DIAGNOSIS — R5383 Other fatigue: Secondary | ICD-10-CM

## 2021-07-22 DIAGNOSIS — R0683 Snoring: Secondary | ICD-10-CM

## 2021-07-29 ENCOUNTER — Telehealth: Payer: Self-pay | Admitting: Pulmonary Disease

## 2021-07-29 DIAGNOSIS — R0683 Snoring: Secondary | ICD-10-CM | POA: Diagnosis not present

## 2021-07-29 NOTE — Telephone Encounter (Signed)
HST 07/22/21 >> AHI 4.3, SpO2 low 89%.   Please let her know her sleep study showed borderline sleep apnea, but not enough to qualify her for additional therapy.  Please schedule ROV with me or NP to discuss results in more detail.

## 2021-07-31 ENCOUNTER — Telehealth: Payer: Self-pay | Admitting: Pulmonary Disease

## 2021-07-31 NOTE — Telephone Encounter (Signed)
ATC patient. LMTCB with [facility] office number  

## 2021-07-31 NOTE — Telephone Encounter (Signed)
Lm for patient.  Please refer to 07/29/2021 phone note.

## 2021-08-01 NOTE — Telephone Encounter (Signed)
Patient returned call. Went over results and scheduled an appt in [place] office with Rexene Edison NP. Appt reminder mailed.

## 2021-08-01 NOTE — Telephone Encounter (Signed)
Patient is aware of results per 07/29/21 phone note.  Will close encounter as nothing further needed.

## 2021-08-04 ENCOUNTER — Other Ambulatory Visit: Payer: Self-pay | Admitting: Adult Health

## 2021-08-04 DIAGNOSIS — I10 Essential (primary) hypertension: Secondary | ICD-10-CM

## 2021-08-06 ENCOUNTER — Other Ambulatory Visit: Payer: Self-pay | Admitting: Adult Health

## 2021-08-06 DIAGNOSIS — I10 Essential (primary) hypertension: Secondary | ICD-10-CM

## 2021-08-08 ENCOUNTER — Encounter: Payer: Self-pay | Admitting: Adult Health

## 2021-08-08 ENCOUNTER — Other Ambulatory Visit: Payer: Self-pay

## 2021-08-08 DIAGNOSIS — I10 Essential (primary) hypertension: Secondary | ICD-10-CM

## 2021-08-08 DIAGNOSIS — F411 Generalized anxiety disorder: Secondary | ICD-10-CM

## 2021-08-08 MED ORDER — HYDROCHLOROTHIAZIDE 12.5 MG PO TABS: 12.5000 mg | ORAL_TABLET | Freq: Every day | ORAL | 3 refills | Status: DC

## 2021-08-09 MED ORDER — HYDROCHLOROTHIAZIDE 12.5 MG PO TABS: 12.5000 mg | ORAL_TABLET | Freq: Every day | ORAL | 0 refills | Status: DC

## 2021-08-09 MED ORDER — BUPROPION HCL ER (XL) 150 MG PO TB24: 150.0000 mg | ORAL_TABLET | Freq: Every day | ORAL | 0 refills | Status: DC

## 2021-08-12 ENCOUNTER — Encounter: Payer: Self-pay | Admitting: Adult Health

## 2021-08-12 ENCOUNTER — Telehealth (INDEPENDENT_AMBULATORY_CARE_PROVIDER_SITE_OTHER): Payer: BC Managed Care – PPO | Admitting: Adult Health

## 2021-08-12 DIAGNOSIS — F411 Generalized anxiety disorder: Secondary | ICD-10-CM | POA: Diagnosis not present

## 2021-08-12 DIAGNOSIS — I10 Essential (primary) hypertension: Secondary | ICD-10-CM

## 2021-08-12 MED ORDER — BUPROPION HCL ER (XL) 150 MG PO TB24: 150.0000 mg | ORAL_TABLET | Freq: Every day | ORAL | 0 refills | Status: DC

## 2021-08-12 MED ORDER — HYDROCHLOROTHIAZIDE 12.5 MG PO TABS: 12.5000 mg | ORAL_TABLET | Freq: Every day | ORAL | 0 refills | Status: DC

## 2021-08-12 NOTE — Progress Notes (Signed)
Virtual Visit via Video Note  I connected with Lauren [name] on 08/12/21 at  4:30 PM EST by a video enabled telemedicine application and verified that I am speaking with the correct person using two identifiers.  Location: Patient: at home  Provider: Provider: Provider's office at  [facility], [place].      I discussed the limitations of evaluation and management by telemedicine and the availability of in person appointments. The patient expressed understanding and agreed to proceed.  History of Present Illness: Patient here for HCTZ and Wellbutrin refill.  She did have sleep study done, said no CPAP needed was borderline.  Denies any suicidal or homicidal ideations. Feels well on wellbutrin. Has no other concerns.   Patient  denies any fever, body aches,chills, rash, chest pain, shortness of breath, nausea, vomiting, or diarrhea.  Denies dizziness, lightheadedness, pre syncopal or syncopal episodes.     Observations/Objective:   Patient is alert and oriented and responsive to questions Engages in conversation with provider. Speaks in full sentences without any pauses without any shortness of breath or distress.    Assessment and Plan:  1. Generalized anxiety disorder She would like to continue dose is working well.  - buPROPion (WELLBUTRIN XL) 150 MG 24 hr tablet; Take 1 tablet (150 mg total) by mouth daily.  Dispense: 90 tablet; Refill: 0 - Comprehensive metabolic panel; Future - CBC with Differential/Platelet; Future - TSH; Future  2. Essential (primary) hypertension Keep log of blood pressure.  - hydrochlorothiazide (HYDRODIURIL) 12.5 MG tablet; Take 1 tablet (12.5 mg total) by mouth daily.  Dispense: 90 tablet; Refill: 0 - Comprehensive metabolic panel; Future - CBC with Differential/Platelet; Future - TSH; Future  Follow Up Instructions: Fasting labs in 3 months. Follow up after appointment for labs within 1- 2 weeks.  Need in  office blood pressure check. Ok to schedule with nurse prior to providers visit.   Return in about 3 months (around 11/10/2021), or if symptoms worsen or fail to improve, for at any time for any worsening symptoms, Go to Emergency room/ urgent care if worse.    Advised in person evaluation at anytime is advised if any symptoms do not improve, worsen or change at any given time.  Red Flags discussed. The patient was given clear instructions to go to ER or return to medical center if any red flags develop, symptoms do not improve, worsen or new problems develop. They verbalized understanding.  I discussed the assessment and treatment plan with the patient. The patient was provided an opportunity to ask questions and all were answered. The patient agreed with the plan and demonstrated an understanding of the instructions.   The patient was advised to call back or seek an in-person evaluation if the symptoms worsen or if the condition fails to improve   Marcille Buffy, FNP

## 2021-08-12 NOTE — Patient Instructions (Signed)
Bupropion Tablets (Depression/Mood Disorders) What is this medication? BUPROPION (byoo PROE pee on) treats depression. It increases norepinephrine and dopamine in the brain, hormones that help regulate mood. It belongs to a group of medications called NDRIs. This medicine may be used for other purposes; ask your health care provider or pharmacist if you have questions. COMMON BRAND NAME(S): Wellbutrin What should I tell my care team before I take this medication? They need to know if you have any of these conditions: An eating disorder, such as anorexia or bulimia Bipolar disorder or psychosis Diabetes or high blood sugar, treated with medication Glaucoma Heart disease, previous heart attack, or irregular heart beat Head injury or brain tumor High blood pressure Kidney or liver disease Seizures Suicidal thoughts or a previous suicide attempt Tourette's syndrome Weight loss An unusual or allergic reaction to bupropion, other medications, foods, dyes, or preservatives Pregnant or trying to become pregnant Breast-feeding How should I use this medication? Take this medication by mouth with a glass of water. Follow the directions on the prescription label. You can take it with or without food. If it upsets your stomach, take it with food. Take your medication at regular intervals. Do not take your medication more often than directed. Do not stop taking this medication suddenly except upon the advice of your care team. Stopping this medication too quickly may cause serious side effects or your condition may worsen. A special MedGuide will be given to you by the pharmacist with each prescription and refill. Be sure to read this information carefully each time. Talk to your care team regarding the use of this medication in children. Special care may be needed. Overdosage: If you think you have taken too much of this medicine contact a poison control center or emergency room at once. NOTE: This  medicine is only for you. Do not share this medicine with others. What if I miss a dose? If you miss a dose, take it as soon as you can. If it is less than four hours to your next dose, take only that dose and skip the missed dose. Do not take double or extra doses. What may interact with this medication? Do not take this medication with any of the following: Linezolid MAOIs like Azilect, Carbex, Eldepryl, Marplan, Nardil, and Parnate Methylene blue (injected into a vein) Other medications that contain bupropion like Zyban This medication may also interact with the following: Alcohol Certain medications for anxiety or sleep Certain medications for blood pressure like metoprolol, propranolol Certain medications for depression or psychotic disturbances Certain medications for HIV or AIDS like efavirenz, lopinavir, nelfinavir, ritonavir Certain medications for irregular heart beat like propafenone, flecainide Certain medications for Parkinson's disease like amantadine, levodopa Certain medications for seizures like carbamazepine, phenytoin, phenobarbital Cimetidine Clopidogrel Cyclophosphamide Digoxin Furazolidone Isoniazid Nicotine Orphenadrine Procarbazine Steroid medications like prednisone or cortisone Stimulant medications for attention disorders, weight loss, or to stay awake Tamoxifen Theophylline Thiotepa Ticlopidine Tramadol Warfarin This list may not describe all possible interactions. Give your health care provider a list of all the medicines, herbs, non-prescription drugs, or dietary supplements you use. Also tell them if you smoke, drink alcohol, or use illegal drugs. Some items may interact with your medicine. What should I watch for while using this medication? Tell your care team if your symptoms do not get better or if they get worse. Visit your care team for regular checks on your progress. Because it may take several weeks to see the full effects of this  medication,  it is important to continue your treatment as prescribed. Watch for new or worsening thoughts of suicide or depression. This includes sudden changes in mood, behavior, or thoughts. These changes can happen at any time but are more common in the beginning of treatment or after a change in dose. Call your care team right away if you experience these thoughts or worsening depression. Manic episodes may happen in patients with bipolar disorder who take this medication. Watch for changes in feelings or behaviors such as feeling anxious, nervous, agitated, panicky, irritable, hostile, aggressive, impulsive, severely restless, overly excited and hyperactive, or trouble sleeping. These symptoms can happen at anytime but are more common in the beginning of treatment or after a change in dose. Call your care team right away if you notice any of these symptoms. This medication may cause serious skin reactions. They can happen weeks to months after starting the medication. Contact your care team right away if you notice fevers or flu-like symptoms with a rash. The rash may be red or purple and then turn into blisters or peeling of the skin. Or, you might notice a red rash with swelling of the face, lips or lymph nodes in your neck or under your arms. Avoid drinks that contain alcohol while taking this medication. Drinking large amounts of alcohol, using sleeping or anxiety medications, or quickly stopping the use of these agents while taking this medication may increase your risk for a seizure. Do not drive or use heavy machinery until you know how this medication affects you. This medication can impair your ability to perform these tasks. Do not take this medication close to bedtime. It may prevent you from sleeping. Your mouth may get dry. Chewing sugarless gum or sucking hard candy, and drinking plenty of water may help. Contact your care team if the problem does not go away or is severe. What side  effects may I notice from receiving this medication? Side effects that you should report to your care team as soon as possible: Allergic reactions--skin rash, itching, hives, swelling of the face, lips, tongue, or throat Increase in blood pressure Mood and behavior changes--anxiety, nervousness, confusion, hallucinations, irritability, hostility, thoughts of suicide or self-harm, worsening mood, feelings of depression Redness, blistering, peeling, or loosening of the skin, including inside the mouth Seizures Sudden eye pain or change in vision such as blurry vision, seeing halos around lights, vision loss Side effects that usually do not require medical attention (report to your care team if they continue or are bothersome): Constipation Dizziness Dry mouth Loss of appetite Nausea Tremors or shaking Trouble sleeping This list may not describe all possible side effects. Call your doctor for medical advice about side effects. You may report side effects to FDA at 1-800-FDA-1088. Where should I keep my medication? Keep out of the reach of children and pets. Store at room temperature between 20 and 25 degrees C (68 and 77 degrees F), away from direct sunlight and moisture. Keep tightly closed. Throw away any unused medication after the expiration date. NOTE: This sheet is a summary. It may not cover all possible information. If you have questions about this medicine, talk to your doctor, pharmacist, or health care provider.  2022 Elsevier/Gold Standard (2020-09-25 00:00:00) Hypertension, Adult High blood pressure (hypertension) is when the force of blood pumping through the arteries is too strong. The arteries are the blood vessels that carry blood from the heart throughout the body. Hypertension forces the heart to work harder to pump blood and  may cause arteries to become narrow or stiff. Untreated or uncontrolled hypertension can cause a heart attack, heart failure, a stroke, kidney disease,  and other problems. A blood pressure reading consists of a higher number over a lower number. Ideally, your blood pressure should be below 120/80. The first ("top") number is called the systolic pressure. It is a measure of the pressure in your arteries as your heart beats. The second ("bottom") number is called the diastolic pressure. It is a measure of the pressure in your arteries as the heart relaxes. What are the causes? The exact cause of this condition is not known. There are some conditions that result in or are related to high blood pressure. What increases the risk? Some risk factors for high blood pressure are under your control. The following factors may make you more likely to develop this condition: Smoking. Having type 2 diabetes mellitus, high cholesterol, or both. Not getting enough exercise or physical activity. Being overweight. Having too much fat, sugar, calories, or salt (sodium) in your diet. Drinking too much alcohol. Some risk factors for high blood pressure may be difficult or impossible to change. Some of these factors include: Having chronic kidney disease. Having a family history of high blood pressure. Age. Risk increases with age. Race. You may be at higher risk if you are African American. Gender. Men are at higher risk than women before age 29. After age 42, women are at higher risk than men. Having obstructive sleep apnea. Stress. What are the signs or symptoms? High blood pressure may not cause symptoms. Very high blood pressure (hypertensive crisis) may cause: Headache. Anxiety. Shortness of breath. Nosebleed. Nausea and vomiting. Vision changes. Severe chest pain. Seizures. How is this diagnosed? This condition is diagnosed by measuring your blood pressure while you are seated, with your arm resting on a flat surface, your legs uncrossed, and your feet flat on the floor. The cuff of the blood pressure monitor will be placed directly against the skin  of your upper arm at the level of your heart. It should be measured at least twice using the same arm. Certain conditions can cause a difference in blood pressure between your right and left arms. Certain factors can cause blood pressure readings to be lower or higher than normal for a short period of time: When your blood pressure is higher when you are in a health care provider's office than when you are at home, this is called white coat hypertension. Most people with this condition do not need medicines. When your blood pressure is higher at home than when you are in a health care provider's office, this is called masked hypertension. Most people with this condition may need medicines to control blood pressure. If you have a high blood pressure reading during one visit or you have normal blood pressure with other risk factors, you may be asked to: Return on a different day to have your blood pressure checked again. Monitor your blood pressure at home for 1 week or longer. If you are diagnosed with hypertension, you may have other blood or imaging tests to help your health care provider understand your overall risk for other conditions. How is this treated? This condition is treated by making healthy lifestyle changes, such as eating healthy foods, exercising more, and reducing your alcohol intake. Your health care provider may prescribe medicine if lifestyle changes are not enough to get your blood pressure under control, and if: Your systolic blood pressure is above 130.  Your diastolic blood pressure is above 80. Your personal target blood pressure may vary depending on your medical conditions, your age, and other factors. Follow these instructions at home: Eating and drinking  Eat a diet that is high in fiber and potassium, and low in sodium, added sugar, and fat. An example eating plan is called the DASH (Dietary Approaches to Stop Hypertension) diet. To eat this way: Eat plenty of fresh  fruits and vegetables. Try to fill one half of your plate at each meal with fruits and vegetables. Eat whole grains, such as whole-wheat pasta, brown rice, or whole-grain bread. Fill about one fourth of your plate with whole grains. Eat or drink low-fat dairy products, such as skim milk or low-fat yogurt. Avoid fatty cuts of meat, processed or cured meats, and poultry with skin. Fill about one fourth of your plate with lean proteins, such as fish, chicken without skin, beans, eggs, or tofu. Avoid pre-made and processed foods. These tend to be higher in sodium, added sugar, and fat. Reduce your daily sodium intake. Most people with hypertension should eat less than 1,500 mg of sodium a day. Do not drink alcohol if: Your health care provider tells you not to drink. You are pregnant, may be pregnant, or are planning to become pregnant. If you drink alcohol: Limit how much you use to: 0-1 drink a day for women. 0-2 drinks a day for men. Be aware of how much alcohol is in your drink. In the [place] one drink equals one 12 oz bottle of beer (355 mL), one 5 oz glass of wine (148 mL), or one 1 oz glass of hard liquor (44 mL). Lifestyle  Work with your health care provider to maintain a healthy body weight or to lose weight. Ask what an ideal weight is for you. Get at least 30 minutes of exercise most days of the week. Activities may include walking, swimming, or biking. Include exercise to strengthen your muscles (resistance exercise), such as Pilates or lifting weights, as part of your weekly exercise routine. Try to do these types of exercises for 30 minutes at least 3 days a week. Do not use any products that contain nicotine or tobacco, such as cigarettes, e-cigarettes, and chewing tobacco. If you need help quitting, ask your health care provider. Monitor your blood pressure at home as told by your health care provider. Keep all follow-up visits as told by your health care provider. This is  important. Medicines Take over-the-counter and prescription medicines only as told by your health care provider. Follow directions carefully. Blood pressure medicines must be taken as prescribed. Do not skip doses of blood pressure medicine. Doing this puts you at risk for problems and can make the medicine less effective. Ask your health care provider about side effects or reactions to medicines that you should watch for. Contact a health care provider if you: Think you are having a reaction to a medicine you are taking. Have headaches that keep coming back (recurring). Feel dizzy. Have swelling in your ankles. Have trouble with your vision. Get help right away if you: Develop a severe headache or confusion. Have unusual weakness or numbness. Feel faint. Have severe pain in your chest or abdomen. Vomit repeatedly. Have trouble breathing. Summary Hypertension is when the force of blood pumping through your arteries is too strong. If this condition is not controlled, it may put you at risk for serious complications. Your personal target blood pressure may vary depending on your medical conditions, your  age, and other factors. For most people, a normal blood pressure is less than 120/80. Hypertension is treated with lifestyle changes, medicines, or a combination of both. Lifestyle changes include losing weight, eating a healthy, low-sodium diet, exercising more, and limiting alcohol. This information is not intended to replace advice given to you by your health care provider. Make sure you discuss any questions you have with your health care provider. Document Revised: 03/23/2018 Document Reviewed: 03/23/2018 Elsevier Patient Education  [place].

## 2021-08-13 ENCOUNTER — Encounter: Payer: Self-pay | Admitting: Adult Health

## 2021-08-19 ENCOUNTER — Encounter: Payer: Self-pay | Admitting: Adult Health

## 2021-08-21 ENCOUNTER — Telehealth (INDEPENDENT_AMBULATORY_CARE_PROVIDER_SITE_OTHER): Payer: BC Managed Care – PPO | Admitting: Adult Health

## 2021-08-21 ENCOUNTER — Encounter: Payer: Self-pay | Admitting: Adult Health

## 2021-08-21 DIAGNOSIS — Z87898 Personal history of other specified conditions: Secondary | ICD-10-CM

## 2021-08-21 DIAGNOSIS — F411 Generalized anxiety disorder: Secondary | ICD-10-CM | POA: Diagnosis not present

## 2021-08-21 DIAGNOSIS — Z8709 Personal history of other diseases of the respiratory system: Secondary | ICD-10-CM

## 2021-08-21 DIAGNOSIS — J0140 Acute pansinusitis, unspecified: Secondary | ICD-10-CM | POA: Diagnosis not present

## 2021-08-21 MED ORDER — BENZONATATE 100 MG PO CAPS: 100.0000 mg | ORAL_CAPSULE | Freq: Three times a day (TID) | ORAL | 0 refills | Status: DC | PRN

## 2021-08-21 MED ORDER — PREDNISONE 10 MG (21) PO TBPK: ORAL_TABLET | ORAL | 0 refills | Status: DC

## 2021-08-21 MED ORDER — AMOXICILLIN-POT CLAVULANATE 875-125 MG PO TABS: 1.0000 | ORAL_TABLET | Freq: Two times a day (BID) | ORAL | 0 refills | Status: DC

## 2021-08-21 NOTE — Patient Instructions (Signed)
Benzonatate Capsules What is this medication? BENZONATATE (ben ZOE na tate) is used to relieve cough. It works by calming your cough reflex. It belongs to a group of medications called cough suppressants. This medicine may be used for other purposes; ask your health care provider or pharmacist if you have questions. COMMON BRAND NAME(S): Tessalon Perles, Zonatuss What should I tell my care team before I take this medication? They need to know if you have any of these conditions: Kidney or liver disease An unusual or allergic reaction to benzonatate, anesthetics, other medications, foods, dyes, or preservatives Pregnant or trying to get pregnant Breast-feeding How should I use this medication? Take this medication by mouth with a glass of water. Follow the directions on the prescription label. Avoid breaking, chewing, or sucking the capsule, as this can cause serious side effects. Take your medication at regular intervals. Do not take your medication more often than directed. Talk to your care team about the use of this medication in children. While this medication may be prescribed for children as young as 89 years old for selected conditions, precautions do apply. Overdosage: If you think you have taken too much of this medicine contact a poison control center or emergency room at once. NOTE: This medicine is only for you. Do not share this medicine with others. What if I miss a dose? If you miss a dose, take it as soon as you can. If it is almost time for your next dose, take only that dose. Do not take double or extra doses. What may interact with this medication? Do not take this medication with any of the following: MAOIs like Carbex, Eldepryl, Marplan, Nardil, and Parnate This list may not describe all possible interactions. Give your health care provider a list of all the medicines, herbs, non-prescription drugs, or dietary supplements you use. Also tell them if you smoke, drink alcohol,  or use illegal drugs. Some items may interact with your medicine. What should I watch for while using this medication? Tell your care team if your symptoms do not improve or if they get worse. If you have a high fever, skin rash, or headache, see your care team. You may get drowsy or dizzy. Do not drive, use machinery, or do anything that needs mental alertness until you know how this medication affects you. Do not sit or stand up quickly, especially if you are an older patient. This reduces the risk of dizzy or fainting spells. What side effects may I notice from receiving this medication? Side effects that you should report to your care team as soon as possible: Allergic reactions--skin rash, itching, hives, swelling of the face, lips, tongue, or throat Confusion Hallucinations Side effects that usually do not require medical attention (report to your care team if they continue or are bothersome): Burning or tingling of the tongue, mouth, throat, or face Dizziness Drowsiness This list may not describe all possible side effects. Call your doctor for medical advice about side effects. You may report side effects to FDA at 1-800-FDA-1088. Where should I keep my medication? Keep out of the reach of children. Store at room temperature between 15 and 30 degrees C (59 and 86 degrees F). Keep tightly closed. Protect from light and moisture. Throw away any unused medication after the expiration date. NOTE: This sheet is a summary. It may not cover all possible information. If you have questions about this medicine, talk to your doctor, pharmacist, or health care provider.  2022 Elsevier/Gold Standard (2020-10-23  00:00:00) Prednisolone Tablets What is this medication? PREDNISOLONE (pred NISS oh lone) treats many conditions such as asthma, allergic reactions, arthritis, inflammatory bowel diseases, adrenal, and blood or bone marrow disorders. It works by decreasing inflammation, slowing down an  overactive immune system, or replacing cortisol normally made in the body. Cortisol is a hormone that plays an important role in how the body responds to stress, illness, and injury. It belongs to a group of medications called steroids. This medicine may be used for other purposes; ask your health care provider or pharmacist if you have questions. COMMON BRAND NAME(S): Millipred, Millipred DP, Millipred DP 12-Day, Millipred DP 6 Day, Prednoral What should I tell my care team before I take this medication? They need to know if you have any of these conditions: Cushing's syndrome Diabetes Glaucoma Heart problems or disease High blood pressure Infection such as herpes, measles, tuberculosis, or chickenpox Kidney disease Liver disease Mental problems Myasthenia gravis Osteoporosis Seizures Stomach ulcer or intestine disease including colitis and diverticulitis Thyroid problem An unusual or allergic reaction to lactose, prednisolone, other medications, foods, dyes, or preservatives Pregnant or trying to get pregnant Breast-feeding How should I use this medication? Take this medication by mouth with a glass of water. Follow the directions on the prescription label. Take it with food or milk to avoid stomach upset. If you are taking this medication once a day, take it in the morning. Do not take more medication than you are told to take. Do not suddenly stop taking your medication because you may develop a severe reaction. Your care team will tell you how much medication to take. If your care team wants you to stop the medication, the dose may be slowly lowered over time to avoid any side effects. Talk to your care team about the use of this medication in children. Special care may be needed. Overdosage: If you think you have taken too much of this medicine contact a poison control center or emergency room at once. NOTE: This medicine is only for you. Do not share this medicine with others. What  if I miss a dose? If you miss a dose, take it as soon as you can. If it is almost time for your next dose, take only that dose. Do not take double or extra doses. What may interact with this medication? Do not take this medication with any of the following: Metyrapone Mifepristone This medication may also interact with the following: Aminoglutethimide Amphotericin B Aspirin and aspirin-like medications Barbiturates Certain medications for diabetes, like glipizide or glyburide Cholestyramine Cholinesterase inhibitors Cyclosporine Digoxin Diuretics Ephedrine Female hormones, like estrogens and birth control pills Isoniazid Ketoconazole NSAIDS, medications for pain and inflammation, like ibuprofen or naproxen Phenytoin Rifampin Toxoids Vaccines Warfarin This list may not describe all possible interactions. Give your health care provider a list of all the medicines, herbs, non-prescription drugs, or dietary supplements you use. Also tell them if you smoke, drink alcohol, or use illegal drugs. Some items may interact with your medicine. What should I watch for while using this medication? Visit your care team for regular checks on your progress. If you are taking this medication over a prolonged period, carry an identification card with your name and address, the type and dose of your medication, and your care team's name and address. This medication may increase your risk of getting an infection. Tell your care team if you are around anyone with measles or chickenpox, or if you develop sores or blisters that do not  heal properly. If you are going to have surgery, tell your care team that you have taken this medication within the last twelve months. Ask your care team about your diet. You may need to lower the amount of salt you eat. This medication may increase blood sugar. Ask your care team if changes in diet or medications are needed if you have diabetes. What side effects may I  notice from receiving this medication? Side effects that you should report to your care team as soon as possible: Allergic reactions--skin rash, itching, hives, swelling of the face, lips, tongue, or throat Cushing syndrome--increased fat around the midsection, upper back, neck, or face, pink or purple stretch marks on the skin, thinning, fragile skin that easily bruises, unexpected hair growth High blood sugar (hyperglycemia)--increased thirst or amount of urine, unusual weakness or fatigue, blurry vision Increase in blood pressure Infection--fever, chills, cough, sore throat, wounds that don't heal, pain or trouble when passing urine, general feeling of discomfort or being unwell Low adrenal gland function--nausea, vomiting, loss of appetite, unusual weakness or fatigue, dizziness Mood and behavior changes--anxiety, nervousness, confusion, hallucinations, irritability, hostility, thoughts of suicide or self-harm, worsening mood, feelings of depression Stomach bleeding--bloody or black, tar-like stools, vomiting blood or brown material that looks like coffee grounds Swelling of the ankles, hands, or feet Side effects that usually do not require medical attention (report to your care team if they continue or are bothersome): Acne General discomfort and fatigue Headache Increase in appetite Nausea Trouble sleeping Weight gain This list may not describe all possible side effects. Call your doctor for medical advice about side effects. You may report side effects to FDA at 1-800-FDA-1088. Where should I keep my medication? Keep out of the reach of children. Store at room temperature between 15 and 30 degrees C (59 and 86 degrees F). Keep container tightly closed. Throw away any unused medication after the expiration date. NOTE: This sheet is a summary. It may not cover all possible information. If you have questions about this medicine, talk to your doctor, pharmacist, or health care provider.   2022 Elsevier/Gold Standard (2020-10-11 00:00:00) Amoxicillin; Clavulanic Acid Tablets What is this medication? AMOXICILLIN; CLAVULANIC ACID (a mox i SIL in; KLAV yoo lan ic AS id) treats infections caused by bacteria. It belongs to a group of medications called penicillin antibiotics. It will not treat colds, the flu, or infections caused by viruses. This medicine may be used for other purposes; ask your health care provider or pharmacist if you have questions. COMMON BRAND NAME(S): Augmentin What should I tell my care team before I take this medication? They need to know if you have any of these conditions: Kidney disease Liver disease Mononucleosis Stomach or intestine problems such as colitis An unusual or allergic reaction to amoxicillin, other penicillin or cephalosporin antibiotics, clavulanic acid, other medications, foods, dyes, or preservatives Pregnant or trying to get pregnant Breast-feeding How should I use this medication? Take this medication by mouth. Take it as directed on the prescription label at the same time every day. Take it with food at the start of a meal or snack. Take all of this medication unless your care team tells you to stop it early. Keep taking it even if you think you are better. Talk to your care team about the use of this medication in children. While it may be prescribed for selected conditions, precautions do apply. Overdosage: If you think you have taken too much of this medicine contact a poison control  center or emergency room at once. NOTE: This medicine is only for you. Do not share this medicine with others. What if I miss a dose? If you miss a dose, take it as soon as you can. If it is almost time for your next dose, take only that dose. Do not take double or extra doses. What may interact with this medication? Allopurinol Anticoagulants Birth control pills Methotrexate Probenecid This list may not describe all possible interactions. Give your  health care provider a list of all the medicines, herbs, non-prescription drugs, or dietary supplements you use. Also tell them if you smoke, drink alcohol, or use illegal drugs. Some items may interact with your medicine. What should I watch for while using this medication? Tell your care team if your symptoms do not start to get better or if they get worse. This medication may cause serious skin reactions. They can happen weeks to months after starting the medication. Contact your care team right away if you notice fevers or flu-like symptoms with a rash. The rash may be red or purple and then turn into blisters or peeling of the skin. Or, you might notice a red rash with swelling of the face, lips or lymph nodes in your neck or under your arms. Do not treat diarrhea with over the counter products. Contact your care team if you have diarrhea that lasts more than 2 days or if it is severe and watery. If you have diabetes, you may get a false-positive result for sugar in your urine. Check with your care team. Birth control may not work properly while you are taking this medication. Talk to your care team about using an extra method of birth control. What side effects may I notice from receiving this medication? Side effects that you should report to your care team as soon as possible: Allergic reactions--skin rash, itching, hives, swelling of the face, lips, tongue, or throat Liver injury--right upper belly pain, loss of appetite, nausea, light-colored stool, dark yellow or brown urine, yellowing skin or eyes, unusual weakness or fatigue Redness, blistering, peeling, or loosening of the skin, including inside the mouth Severe diarrhea, fever Unusual vaginal discharge, itching, or odor Side effects that usually do not require medical attention (report to your care team if they continue or are bothersome): Diarrhea Nausea Vomiting This list may not describe all possible side effects. Call your doctor  for medical advice about side effects. You may report side effects to FDA at 1-800-FDA-1088. Where should I keep my medication? Keep out of the reach of children and pets. Store at room temperature between 20 and 25 degrees C (68 and 77 degrees F). Throw away any unused medication after the expiration date. NOTE: This sheet is a summary. It may not cover all possible information. If you have questions about this medicine, talk to your doctor, pharmacist, or health care provider.  2022 Elsevier/Gold Standard (2020-07-07 00:00:00) Cough, Adult A cough helps to clear your throat and lungs. A cough may be a sign of an illness or another medical condition. An acute cough may only last 2-3 weeks, while a chronic cough may last 8 or more weeks. Many things can cause a cough. They include: Germs (viruses or bacteria) that attack the airway. Breathing in things that bother (irritate) your lungs. Allergies. Asthma. Mucus that runs down the back of your throat (postnasal drip). Smoking. Acid backing up from the stomach into the tube that moves food from the mouth to the stomach (gastroesophageal reflux).  Some medicines. Lung problems. Other medical conditions, such as heart failure or a blood clot in the lung (pulmonary embolism). Follow these instructions at home: Medicines Take over-the-counter and prescription medicines only as told by your doctor. Talk with your doctor before you take medicines that stop a cough (cough suppressants). Lifestyle  Do not smoke, and try not to be around smoke. Do not use any products that contain nicotine or tobacco, such as cigarettes, e-cigarettes, and chewing tobacco. If you need help quitting, ask your doctor. Drink enough fluid to keep your pee (urine) pale yellow. Avoid caffeine. Do not drink alcohol if your doctor tells you not to drink. General instructions  Watch for any changes in your cough. Tell your doctor about them. Always cover your mouth when  you cough. Stay away from things that make you cough, such as perfume, candles, campfire smoke, or cleaning products. If the air is dry, use a cool mist vaporizer or humidifier in your home. If your cough is worse at night, try using extra pillows to raise your head up higher while you sleep. Rest as needed. Keep all follow-up visits as told by your doctor. This is important. Contact a doctor if: You have new symptoms. You cough up pus. Your cough does not get better after 2-3 weeks, or your cough gets worse. Cough medicine does not help your cough and you are not sleeping well. You have pain that gets worse or pain that is not helped with medicine. You have a fever. You are losing weight and you do not know why. You have night sweats. Get help right away if: You cough up blood. You have trouble breathing. Your heartbeat is very fast. These symptoms may be an emergency. Do not wait to see if the symptoms will go away. Get medical help right away. Call your local emergency services (911 in the [place] Do not drive yourself to the hospital. Summary A cough helps to clear your throat and lungs. Many things can cause a cough. Take over-the-counter and prescription medicines only as told by your doctor. Always cover your mouth when you cough. Contact a doctor if you have new symptoms or you have a cough that does not get better or gets worse. This information is not intended to replace advice given to you by your health care provider. Make sure you discuss any questions you have with your health care provider. Document Revised: 09/01/2019 Document Reviewed: 08/01/2018 Elsevier Patient Education  [place]. Sinusitis, Adult Sinusitis is inflammation of your sinuses. Sinuses are hollow spaces in the bones around your face. Your sinuses are located: Around your eyes. In the middle of your forehead. Behind your nose. In your cheekbones. Mucus normally drains out of your sinuses. When  your nasal tissues become inflamed or swollen, mucus can become trapped or blocked. This allows bacteria, viruses, and fungi to grow, which leads to infection. Most infections of the sinuses are caused by a virus. Sinusitis can develop quickly. It can last for up to 4 weeks (acute) or for more than 12 weeks (chronic). Sinusitis often develops after a cold. What are the causes? This condition is caused by anything that creates swelling in the sinuses or stops mucus from draining. This includes: Allergies. Asthma. Infection from bacteria or viruses. Deformities or blockages in your nose or sinuses. Abnormal growths in the nose (nasal polyps). Pollutants, such as chemicals or irritants in the air. Infection from fungi (rare). What increases the risk? You are more likely to  develop this condition if you: Have a weak body defense system (immune system). Do a lot of swimming or diving. Overuse nasal sprays. Smoke. What are the signs or symptoms? The main symptoms of this condition are pain and a feeling of pressure around the affected sinuses. Other symptoms include: Stuffy nose or congestion. Thick drainage from your nose. Swelling and warmth over the affected sinuses. Headache. Upper toothache. A cough that may get worse at night. Extra mucus that collects in the throat or the back of the nose (postnasal drip). Decreased sense of smell and taste. Fatigue. A fever. Sore throat. Bad breath. How is this diagnosed? This condition is diagnosed based on: Your symptoms. Your medical history. A physical exam. Tests to find out if your condition is acute or chronic. This may include: Checking your nose for nasal polyps. Viewing your sinuses using a device that has a light (endoscope). Testing for allergies or bacteria. Imaging tests, such as an MRI or CT scan. In rare cases, a bone biopsy may be done to rule out more serious types of fungal sinus disease. How is this treated? Treatment  for sinusitis depends on the cause and whether your condition is chronic or acute. If caused by a virus, your symptoms should go away on their own within 10 days. You may be given medicines to relieve symptoms. They include: Medicines that shrink swollen nasal passages (topical intranasal decongestants). Medicines that treat allergies (antihistamines). A spray that eases inflammation of the nostrils (topical intranasal corticosteroids). Rinses that help get rid of thick mucus in your nose (nasal saline washes). If caused by bacteria, your health care provider may recommend waiting to see if your symptoms improve. Most bacterial infections will get better without antibiotic medicine. You may be given antibiotics if you have: A severe infection. A weak immune system. If caused by narrow nasal passages or nasal polyps, you may need to have surgery. Follow these instructions at home: Medicines Take, use, or apply over-the-counter and prescription medicines only as told by your health care provider. These may include nasal sprays. If you were prescribed an antibiotic medicine, take it as told by your health care provider. Do not stop taking the antibiotic even if you start to feel better. Hydrate and humidify  Drink enough fluid to keep your urine pale yellow. Staying hydrated will help to thin your mucus. Use a cool mist humidifier to keep the humidity level in your home above 50%. Inhale steam for 10-15 minutes, 3-4 times a day, or as told by your health care provider. You can do this in the bathroom while a hot shower is running. Limit your exposure to cool or dry air. Rest Rest as much as possible. Sleep with your head raised (elevated). Make sure you get enough sleep each night. General instructions  Apply a warm, moist washcloth to your face 3-4 times a day or as told by your health care provider. This will help with discomfort. Wash your hands often with soap and water to reduce your  exposure to germs. If soap and water are not available, use hand sanitizer. Do not smoke. Avoid being around people who are smoking (secondhand smoke). Keep all follow-up visits as told by your health care provider. This is important. Contact a health care provider if: You have a fever. Your symptoms get worse. Your symptoms do not improve within 10 days. Get help right away if: You have a severe headache. You have persistent vomiting. You have severe pain or swelling around  your face or eyes. You have vision problems. You develop confusion. Your neck is stiff. You have trouble breathing. Summary Sinusitis is soreness and inflammation of your sinuses. Sinuses are hollow spaces in the bones around your face. This condition is caused by nasal tissues that become inflamed or swollen. The swelling traps or blocks the flow of mucus. This allows bacteria, viruses, and fungi to grow, which leads to infection. If you were prescribed an antibiotic medicine, take it as told by your health care provider. Do not stop taking the antibiotic even if you start to feel better. Keep all follow-up visits as told by your health care provider. This is important. This information is not intended to replace advice given to you by your health care provider. Make sure you discuss any questions you have with your health care provider. Document Revised: 12/13/2017 Document Reviewed: 12/13/2017 Elsevier Patient Education  2022 [employer].

## 2021-08-21 NOTE — Progress Notes (Signed)
Virtual Visit via Telephone Note  I connected with Lauren [name] on 08/21/21 at  4:30 PM EST by telephone and verified that I am speaking with the correct person using two identifiers.  Location: Patient: at home  Provider: Provider: Provider's office at  [facility], [place].      I discussed the limitations, risks, security and privacy concerns of performing an evaluation and management service by telephone and the availability of in person appointments. I also discussed with the patient that there may be a patient responsible charge related to this service. The patient expressed understanding and agreed to proceed.   History of Present Illness: Patient has pressure in her right ear, she has cough and congestion, post nasal discharge. Slight chest congestion..   Has tried mucinex , sudafed, and robitussin. She has some mild fatigue.   No LMP recorded. (Menstrual status: IUD). - 08/03/2021.    Patient  denies any fever, body aches,chills, rash, chest pain, shortness of breath, nausea, vomiting, or diarrhea.  Denies dizziness, lightheadedness, pre syncopal or syncopal episodes.     Observations/Objective:   Patient is alert and oriented and responsive to questions Engages in conversation with provider. Speaks in full sentences without any pauses without any shortness of breath or distress.    Assessment and Plan:  1. Acute non-recurrent pansinusitis  2. History of URI (upper respiratory infection)  3. Generalized anxiety disorder - Ambulatory referral to Psychiatry  4. History of difficulty in concentrating - Ambulatory referral to Psychiatry    Meds ordered this encounter  Medications   amoxicillin-clavulanate (AUGMENTIN) 875-125 MG tablet    Sig: Take 1 tablet by mouth 2 (two) times daily.    Dispense:  20 tablet    Refill:  0   predniSONE (STERAPRED UNI-PAK 21 TAB) 10 MG (21) TBPK tablet    Sig: PO: Take 6 tablets on day  1:Take 5 tablets day 2:Take 4 tablets day 3: Take 3 tablets day 4:Take 2 tablets day five: Take 1 tablet day 6    Dispense:  21 tablet    Refill:  0   benzonatate (TESSALON) 100 MG capsule    Sig: Take 1 capsule (100 mg total) by mouth 3 (three) times daily as needed for cough.    Dispense:  20 capsule    Refill:  0     She also requests referral to [facility] reports she has had difficulty staying on task and concentrating all her life. No new concerns. She is aware she should get a call within 2 weeks.   Discussed if not improving needs in office visit. - chest x ray and labs.   Patient verbalized understanding of all instructions given and denies any further questions at this time.   Advised in person evaluation at anytime is advised if any symptoms do not improve, worsen or change at any given time.  Red Flags discussed. The patient was given clear instructions to go to ER or return to medical center if any red flags develop, symptoms do not improve, worsen or new problems develop. They verbalized understanding.  Return in about 1 week (around 08/28/2021), or if symptoms worsen or fail to improve, for at any time for any worsening symptoms, Go to Emergency room/ urgent care if worse.  . Follow Up Instructions:   Red Flags discussed. The patient was given clear instructions to go to ER or return to medical center if any red flags develop, symptoms  do not improve, worsen or new problems develop. They verbalized understanding.  I discussed the assessment and treatment plan with the patient. The patient was provided an opportunity to ask questions and all were answered. The patient agreed with the plan and demonstrated an understanding of the instructions.   The patient was advised to call back or seek an in-person evaluation if the symptoms worsen or if the condition fails to improve as anticipated.    A total of 15   minutes were spent on telephone  visit.    Marcille Buffy, FNP

## 2021-08-27 ENCOUNTER — Encounter: Payer: Self-pay | Admitting: Adult Health

## 2021-09-02 ENCOUNTER — Encounter: Payer: Self-pay | Admitting: Adult Health

## 2021-09-02 ENCOUNTER — Other Ambulatory Visit: Payer: Self-pay

## 2021-09-02 ENCOUNTER — Ambulatory Visit (INDEPENDENT_AMBULATORY_CARE_PROVIDER_SITE_OTHER): Payer: BC Managed Care – PPO | Admitting: Adult Health

## 2021-09-02 ENCOUNTER — Ambulatory Visit (INDEPENDENT_AMBULATORY_CARE_PROVIDER_SITE_OTHER): Payer: BC Managed Care – PPO

## 2021-09-02 VITALS — BP 116/78 | HR 68 | Temp 98.0°F | Resp 14 | Ht 62.01 in | Wt 212.4 lb

## 2021-09-02 DIAGNOSIS — Z9884 Bariatric surgery status: Secondary | ICD-10-CM

## 2021-09-02 DIAGNOSIS — E538 Deficiency of other specified B group vitamins: Secondary | ICD-10-CM | POA: Diagnosis not present

## 2021-09-02 DIAGNOSIS — R051 Acute cough: Secondary | ICD-10-CM | POA: Diagnosis not present

## 2021-09-02 DIAGNOSIS — F32A Depression, unspecified: Secondary | ICD-10-CM

## 2021-09-02 DIAGNOSIS — G9331 Postviral fatigue syndrome: Secondary | ICD-10-CM

## 2021-09-02 DIAGNOSIS — E559 Vitamin D deficiency, unspecified: Secondary | ICD-10-CM

## 2021-09-02 DIAGNOSIS — F321 Major depressive disorder, single episode, moderate: Secondary | ICD-10-CM | POA: Insufficient documentation

## 2021-09-02 DIAGNOSIS — G4730 Sleep apnea, unspecified: Secondary | ICD-10-CM

## 2021-09-02 DIAGNOSIS — R5383 Other fatigue: Secondary | ICD-10-CM | POA: Diagnosis not present

## 2021-09-02 DIAGNOSIS — J069 Acute upper respiratory infection, unspecified: Secondary | ICD-10-CM | POA: Diagnosis not present

## 2021-09-02 DIAGNOSIS — H6501 Acute serous otitis media, right ear: Secondary | ICD-10-CM

## 2021-09-02 LAB — COMPREHENSIVE METABOLIC PANEL
ALT: 10 U/L (ref 0–35)
AST: 15 U/L (ref 0–37)
Albumin: 4 g/dL (ref 3.5–5.2)
Alkaline Phosphatase: 58 U/L (ref 39–117)
BUN: 16 mg/dL (ref 6–23)
CO2: 31 mEq/L (ref 19–32)
Calcium: 8.8 mg/dL (ref 8.4–10.5)
Chloride: 104 mEq/L (ref 96–112)
Creatinine, Ser: 0.56 mg/dL (ref 0.40–1.20)
GFR: 113.8 mL/min (ref >60.00)
Glucose, Bld: 76 mg/dL (ref 70–99)
Potassium: 3.6 mEq/L (ref 3.5–5.1)
Sodium: 139 mEq/L (ref 135–145)
Total Bilirubin: 0.8 mg/dL (ref 0.2–1.2)
Total Protein: 6.3 g/dL (ref 6.0–8.3)

## 2021-09-02 LAB — CBC WITH DIFFERENTIAL/PLATELET
Basophils Absolute: 0 10*3/uL (ref 0.0–0.1)
Basophils Relative: 0.8 % (ref 0.0–3.0)
Eosinophils Absolute: 0.1 10*3/uL (ref 0.0–0.7)
Eosinophils Relative: 1.4 % (ref 0.0–5.0)
HCT: 39.2 % (ref 36.0–46.0)
Hemoglobin: 13.3 g/dL (ref 12.0–15.0)
Lymphocytes Relative: 33.8 % (ref 12.0–46.0)
Lymphs Abs: 1.6 10*3/uL (ref 0.7–4.0)
MCHC: 33.9 g/dL (ref 30.0–36.0)
MCV: 93.8 fl (ref 78.0–100.0)
Monocytes Absolute: 0.3 10*3/uL (ref 0.1–1.0)
Monocytes Relative: 6.3 % (ref 3.0–12.0)
Neutro Abs: 2.7 10*3/uL (ref 1.4–7.7)
Neutrophils Relative %: 57.7 % (ref 43.0–77.0)
Platelets: 170 10*3/uL (ref 150.0–400.0)
RBC: 4.18 Mil/uL (ref 3.87–5.11)
RDW: 12.5 % (ref 11.5–15.5)
WBC: 4.7 10*3/uL (ref 4.0–10.5)

## 2021-09-02 LAB — VITAMIN D 25 HYDROXY (VIT D DEFICIENCY, FRACTURES): VITD: 22.54 ng/mL — ABNORMAL LOW (ref 30.00–100.00)

## 2021-09-02 LAB — TSH: TSH: 1.2 u[IU]/mL (ref 0.35–5.50)

## 2021-09-02 LAB — VITAMIN B12: Vitamin B-12: 389 pg/mL (ref 211–911)

## 2021-09-02 MED ORDER — AZITHROMYCIN 250 MG PO TABS: ORAL_TABLET | ORAL | 0 refills | Status: AC

## 2021-09-02 NOTE — Assessment & Plan Note (Addendum)
Labs and chest x ray today given URI last week. Increase hydration, expect post viral syndrome.

## 2021-09-02 NOTE — Assessment & Plan Note (Signed)
Cough has mostly resolved non productive treated with Augmentin at video visit last week. Chest x ray today given fatigue to rule out pneumonia.

## 2021-09-02 NOTE — Patient Instructions (Addendum)
[facility] [place]: [phone]   Eustachian Tube Dysfunction Eustachian tube dysfunction refers to a condition in which a blockage develops in the narrow passage that connects the middle ear to the back of the nose (eustachian tube). The eustachian tube regulates air pressure in the middle ear by letting air move between the ear and nose. It also helps to drain fluid from the middle ear space. Eustachian tube dysfunction can affect one or both ears. When the eustachian tube does not function properly, air pressure, fluid, or both can build up in the middle ear. What are the causes? This condition occurs when the eustachian tube becomes blocked or cannot open normally. Common causes of this condition include: Ear infections. Colds and other infections that affect the nose, mouth, and throat (upper respiratory tract). Allergies. Irritation from cigarette smoke. Irritation from stomach acid coming up into the esophagus (gastroesophageal reflux). The esophagus is the part of the body that moves food from the mouth to the stomach. Sudden changes in air pressure, such as from descending in an airplane or scuba diving. Abnormal growths in the nose or throat, such as: Growths that line the nose (nasal polyps). Abnormal growth of cells (tumors). Enlarged tissue at the back of the throat (adenoids). What increases the risk? You are more likely to develop this condition if: You smoke. You are overweight. You are a child who has: Certain birth defects of the mouth, such as cleft palate. Large tonsils or adenoids. What are the signs or symptoms? Common symptoms of this condition include: A feeling of fullness in the ear. Ear pain. Clicking or popping noises in the ear. Ringing in the ear (tinnitus). Hearing loss. Loss of balance. Dizziness. Symptoms may get worse when the air pressure around you changes, such as when you travel to an area of high elevation, fly on  an airplane, or go scuba diving. How is this diagnosed? This condition may be diagnosed based on: Your symptoms. A physical exam of your ears, nose, and throat. Tests, such as those that measure: The movement of your eardrum. Your hearing (audiometry). How is this treated? Treatment depends on the cause and severity of your condition. In mild cases, you may relieve your symptoms by moving air into your ears. This is called "popping the ears." In more severe cases, or if you have symptoms of fluid in your ears, treatment may include: Medicines to relieve congestion (decongestants). Medicines that treat allergies (antihistamines). Nasal sprays or ear drops that contain medicines that reduce swelling (steroids). A procedure to drain the fluid in your eardrum. In this procedure, a small tube may be placed in the eardrum to: Drain the fluid. Restore the air in the middle ear space. A procedure to insert a balloon device through the nose to inflate the opening of the eustachian tube (balloon dilation). Follow these instructions at home: Lifestyle Do not do any of the following until your health care provider approves: Travel to high altitudes. Fly in airplanes. Work in a Pension scheme manager or room. Scuba dive. Do not use any products that contain nicotine or tobacco. These products include cigarettes, chewing tobacco, and vaping devices, such as e-cigarettes. If you need help quitting, ask your health care provider. Keep your ears dry. Wear fitted earplugs during showering and bathing. Dry your ears completely after. General instructions Take over-the-counter and prescription medicines only as told by your health care provider. Use techniques to help pop your ears as recommended by your health care provider. These may  include: Chewing gum. Yawning. Frequent, forceful swallowing. Closing your mouth, holding your nose closed, and gently blowing as if you are trying to blow air out of your  nose. Keep all follow-up visits. This is important. Contact a health care provider if: Your symptoms do not go away after treatment. Your symptoms come back after treatment. You are unable to pop your ears. You have: A fever. Pain in your ear. Pain in your head or neck. Fluid draining from your ear. Your hearing suddenly changes. You become very dizzy. You lose your balance. Get help right away if: You have a sudden, severe increase in any of your symptoms. Summary Eustachian tube dysfunction refers to a condition in which a blockage develops in the eustachian tube. It can be caused by ear infections, allergies, inhaled irritants, or abnormal growths in the nose or throat. Symptoms may include ear pain or fullness, hearing loss, or ringing in the ears. Mild cases are treated with techniques to unblock the ears, such as yawning or chewing gum. More severe cases are treated with medicines or procedures. This information is not intended to replace advice given to you by your health care provider. Make sure you discuss any questions you have with your health care provider. Document Revised: 09/23/2020 Document Reviewed: 09/23/2020 Elsevier Patient Education  [place]. Fluticasone Nasal Spray What is this medication? FLUTICASONE (floo TIK a sone) treats allergy symptoms such as sneezing, itching, and runny or stuffy nose. It may also be used to treat nasal polyps. It works by decreasing inflammation in your nose, making it easier to breathe. It belongs to a group of medications called nasal steroids. This medicine may be used for other purposes; ask your health care provider or pharmacist if you have questions. COMMON BRAND NAME(S): ClariSpray, Flonase, Flonase Allergy Relief, Flonase Sensimist, Veramyst, XHANCE What should I tell my care team before I take this medication? They need to know if you have any of these conditions: Eye disease, vision problems Infection, like  tuberculosis, herpes, or fungal infection Recent surgery on nose or sinuses Taking a corticosteroid by mouth An unusual or allergic reaction to fluticasone, steroids, other medications, foods, dyes, or preservatives Pregnant or trying to get pregnant Breast-feeding How should I use this medication? This medication is for use in the nose. Follow the directions on your prescription or product label. Do not use more often than directed. Do not share this medication with anyone else. Make sure that you are using your nasal spray correctly. Ask your care team if you have any questions. This medication comes with INSTRUCTIONS FOR USE. Ask your pharmacist for directions on how to use this medication. Read the information carefully. Talk to your care team if you have questions. Talk to your care team about the use of this medication in children. While it may be prescribed to children as young as 2 years for selected conditions, precautions do apply. Overdosage: If you think you have taken too much of this medicine contact a poison control center or emergency room at once. NOTE: This medicine is only for you. Do not share this medicine with others. What if I miss a dose? If you miss a dose, use it as soon as you remember. If it is almost time for your next dose, use only that dose and continue with your regular schedule. Do not use double or extra doses. What may interact with this medication? Certain antibiotics like clarithromycin and telithromycin Certain medications for fungal infections like ketoconazole, itraconazole,  and voriconazole Conivaptan Nefazodone Some medications for HIV Vaccines This list may not describe all possible interactions. Give your health care provider a list of all the medicines, herbs, non-prescription drugs, or dietary supplements you use. Also tell them if you smoke, drink alcohol, or use illegal drugs. Some items may interact with your medicine. What should I watch for  while using this medication? Visit your healthcare professional for regular checks on your progress. Tell your healthcare professional if your symptoms do not start to get better or if they get worse. This medication may increase your risk of getting an infection. Tell your doctor or health care professional if you are around anyone with measles or chickenpox, or if you develop sores or blisters that do not heal properly. What side effects may I notice from receiving this medication? Side effects that you should report to your care team as soon as possible: Allergic reactions--skin rash, itching, hives, swelling of the face, lips, tongue, or throat Crusting or sores in the nose Frequent or severe nosebleeds Low adrenal gland function--nausea, vomiting, loss of appetite, unusual weakness, fatigue, dizziness Thrush--white patches in the nose or mouth Whistling through one or both nostrils that does not go away Side effects that usually do not require medical attention (report to your care team if they continue or are bothersome): Change in sense of smell Change in taste Cough Headache Irritation inside the nose or throat This list may not describe all possible side effects. Call your doctor for medical advice about side effects. You may report side effects to FDA at 1-800-FDA-1088. Where should I keep my medication? Keep out of the reach of children and pets. Store at room temperature between 15 and 25 degrees C (59 and 77 degrees F). Protect from light. Get rid of any unused medication after the expiration date. To get rid of medications that are no longer needed or have expired: Take the medication to a medication take-back program. Check with your pharmacy or law enforcement to find a location. If you cannot return the medication, ask your pharmacist or care team how to get rid of this medication safely. NOTE: This sheet is a summary. It may not cover all possible information. If you have  questions about this medicine, talk to your doctor, pharmacist, or health care provider.  2022 Elsevier/Gold Standard (2020-08-09 00:00:00)   Otitis Media, Adult Otitis media is a condition in which the middle ear is red and swollen (inflamed) and full of fluid. The middle ear is the part of the ear that contains bones for hearing as well as air that helps send sounds to the brain. The condition usually goes away on its own. What are the causes? This condition is caused by a blockage in the eustachian tube. This tube connects the middle ear to the back of the nose. It normally allows air into the middle ear. The blockage is caused by fluid or swelling. Problems that can cause blockage include: A cold or infection that affects the nose, mouth, or throat. Allergies. An irritant, such as tobacco smoke. Adenoids that have become large. The adenoids are soft tissue located in the back of the throat, behind the nose and the roof of the mouth. Growth or swelling in the upper part of the throat, just behind the nose (nasopharynx). Damage to the ear caused by a change in pressure. This is called barotrauma. What increases the risk? You are more likely to develop this condition if you: Smoke or are exposed to  tobacco smoke. Have an opening in the roof of your mouth (cleft palate). Have acid reflux. Have problems in your body's defense system (immune system). What are the signs or symptoms? Symptoms of this condition include: Ear pain. Fever. Problems with hearing. Being tired. Fluid leaking from the ear. Ringing in the ear. How is this treated? This condition can go away on its own within 3-5 days. But if the condition is caused by germs (bacteria) and does not go away on its own, or if it keeps coming back, your doctor may: Give you antibiotic medicines. Give you medicines for pain. Follow these instructions at home: Take over-the-counter and prescription medicines only as told by your  doctor. If you were prescribed an antibiotic medicine, take it as told by your doctor. Do not stop taking it even if you start to feel better. Keep all follow-up visits. Contact a doctor if: You have bleeding from your nose. There is a lump on your neck. You are not feeling better in 5 days. You feel worse instead of better. Get help right away if: You have pain that is not helped with medicine. You have swelling, redness, or pain around your ear. You get a stiff neck. You cannot move part of your face (paralysis). You notice that the bone behind your ear hurts when you touch it. You get a very bad headache. Summary Otitis media means that the middle ear is red, swollen, and full of fluid. This condition usually goes away on its own. If the problem does not go away, treatment may be needed. You may be given medicines to treat the infection or to treat your pain. If you were prescribed an antibiotic medicine, take it as told by your doctor. Do not stop taking it even if you start to feel better. Keep all follow-up visits. This information is not intended to replace advice given to you by your health care provider. Make sure you discuss any questions you have with your health care provider. Document Revised: 10/21/2020 Document Reviewed: 10/21/2020 Elsevier Patient Education  [place].   Fatigue If you have fatigue, you feel tired all the time and have a lack of energy or a lack of motivation. Fatigue may make it difficult to start or complete tasks because of exhaustion. In general, occasional or mild fatigue is often a normal response to activity or life. However, long-lasting (chronic) or extreme fatigue may be a symptom of a medical condition. Follow these instructions at home: General instructions Watch your fatigue for any changes. Go to bed and get up at the same time every day. Avoid fatigue by pacing yourself during the day and getting enough sleep at night. Maintain  a healthy weight. Medicines Take over-the-counter and prescription medicines only as told by your health care provider. Take a multivitamin, if told by your health care provider.  Do not use herbal or dietary supplements unless they are approved by your health care provider. Activity  Exercise regularly, as told by your health care provider. Use or practice techniques to help you relax, such as yoga, tai chi, meditation, or massage therapy. Eating and drinking  Avoid heavy meals in the evening. Eat a well-balanced diet, which includes lean proteins, whole grains, plenty of fruits and vegetables, and low-fat dairy products. Avoid consuming too much caffeine. Avoid the use of alcohol. Drink enough fluid to keep your urine pale yellow. Lifestyle Change situations that cause you stress. Try to keep your work and personal schedule in balance.  Do not use any products that contain nicotine or tobacco, such as cigarettes and e-cigarettes. If you need help quitting, ask your health care provider. Do not use drugs. Contact a health care provider if: Your fatigue does not get better. You have a fever. You suddenly lose or gain weight. You have headaches. You have trouble falling asleep or sleeping through the night. You feel angry, guilty, anxious, or sad. You are unable to have a bowel movement (constipation). Your skin is dry. You have swelling in your legs or another part of your body. Get help right away if: You feel confused. Your vision is blurry. You feel faint or you pass out. You have a severe headache. You have severe pain in your abdomen, your back, or the area between your waist and hips (pelvis). You have chest pain, shortness of breath, or an irregular or fast heartbeat. You are unable to urinate, or you urinate less than normal. You have abnormal bleeding, such as bleeding from the rectum, vagina, nose, lungs, or nipples. You vomit blood. You have thoughts about hurting  yourself or others. If you ever feel like you may hurt yourself or others, or have thoughts about taking your own life, get help right away. You can go to your nearest emergency department or call: Your local emergency services (911 in the [place] A suicide crisis helpline, such as the [place] at (435)331-4444 or 988 in the [place]. This is open 24 hours a day. Summary If you have fatigue, you feel tired all the time and have a lack of energy or a lack of motivation. Fatigue may make it difficult to start or complete tasks because of exhaustion. Long-lasting (chronic) or extreme fatigue may be a symptom of a medical condition. Exercise regularly, as told by your health care provider. Change situations that cause you stress. Try to keep your work and personal schedule in balance. This information is not intended to replace advice given to you by your health care provider. Make sure you discuss any questions you have with your health care provider. Document Revised: 02/05/2021 Document Reviewed: 05/23/2020 Elsevier Patient Education  2022 [employer].

## 2021-09-02 NOTE — Progress Notes (Signed)
Acute Office Visit  Subjective:    Patient ID: Lauren [name], female    DOB: 07-07-1981, 41 y.o.   MRN: [id number]  Chief Complaint  Patient presents with   Acute Visit    Feeling more tired than usual onset 1-2 wks ago. Has 1 episode of nausea last week but has subsided.    HPI Patient is in today for fatigue, she was treated last week  on a video visit, with Augmentin, benzonatate, and prednisone dose pack. She did not take prednisone due to history of gastric bypass.   She has had fatigue and had to take naps in the morning. She is extremely tired. She has had sleep study.   Patient's last menstrual period was 08/27/2021 (approximate). She requested a referral to [facility] on that visit as well and that was placed, she also suffers with anxiety.   History of sleep study, Chesley Mires, MD    9:10 AM Note   HST 07/22/21 >> AHI 4.3, SpO2 low 89%.     Please let her know her sleep study showed borderline sleep apnea, but not enough to qualify her for additional therapy.  Please schedule ROV with me or NP to discuss results in more detail.     Patient  denies any fever, body aches,chills, rash, chest pain, shortness of breath, nausea, vomiting, or diarrhea.  Denies dizziness, lightheadedness, pre syncopal or syncopal episodes.    Past Medical History:  Diagnosis Date   Anxiety    Edema    ankle swelling    Past Surgical History:  Procedure Laterality Date   BARIATRIC SURGERY  02/2014   Gastric Sleeve   BASAL CELL CARCINOMA EXCISION  10/2015   CHOLECYSTECTOMY  02/2014   WISDOM TOOTH EXTRACTION      Family History  Problem Relation Age of Onset   Migraines Mother    Hypertension Mother    Hyperlipidemia Mother    Depression Mother    Sleep apnea Mother    Breast cancer Maternal Grandmother    Cancer Maternal Grandmother        breast   Asthma Daughter    Allergies Daughter    Asthma Son    Allergies Son    Cancer Maternal Grandfather         pancreatic     Social History   Socioeconomic History   Marital status: Single    Spouse name: Not on file   Number of children: Not on file   Years of education: Not on file   Highest education level: Not on file  Occupational History   Not on file  Tobacco Use   Smoking status: Never   Smokeless tobacco: Never  Substance and Sexual Activity   Alcohol use: No    Alcohol/week: 0.0 standard drinks   Drug use: No   Sexual activity: Yes    Birth control/protection: I.U.D.    Comment: mirena  Other Topics Concern   Not on file  Social History Narrative   Not on file   Social Determinants of Health   Financial Resource Strain: Not on file  Food Insecurity: Not on file  Transportation Needs: Not on file  Physical Activity: Not on file  Stress: Not on file  Social Connections: Not on file  Intimate Partner Violence: Not on file    Outpatient Medications Prior to Visit  Medication Sig Dispense Refill   buPROPion (WELLBUTRIN XL) 150 MG 24 hr tablet Take 1 tablet (150 mg total) by  mouth daily. 90 tablet 0   hydrochlorothiazide (HYDRODIURIL) 12.5 MG tablet Take 1 tablet (12.5 mg total) by mouth daily. 90 tablet 0   benzonatate (TESSALON) 100 MG capsule Take 1 capsule (100 mg total) by mouth 3 (three) times daily as needed for cough. 20 capsule 0   amoxicillin-clavulanate (AUGMENTIN) 875-125 MG tablet Take 1 tablet by mouth 2 (two) times daily. (Patient not taking: Reported on 09/02/2021) 20 tablet 0   predniSONE (STERAPRED UNI-PAK 21 TAB) 10 MG (21) TBPK tablet PO: Take 6 tablets on day 1:Take 5 tablets day 2:Take 4 tablets day 3: Take 3 tablets day 4:Take 2 tablets day five: Take 1 tablet day 6 (Patient not taking: Reported on 09/02/2021) 21 tablet 0   No facility-administered medications prior to visit.    No Known Allergies  Review of Systems  Constitutional:  Positive for fatigue. Negative for activity change, appetite change, chills, diaphoresis, fever and  unexpected weight change.  HENT:  Positive for ear pain (right ear still hurting not as bad as it was).   Respiratory:  Positive for cough. Negative for apnea, choking, chest tightness, shortness of breath, wheezing and stridor.   Cardiovascular: Negative.   Gastrointestinal: Negative.   Genitourinary: Negative.   Psychiatric/Behavioral:  Positive for decreased concentration. Negative for dysphoric mood and hallucinations. The patient is nervous/anxious. The patient is not hyperactive.       Objective:    Physical Exam Vitals reviewed.  Constitutional:      Appearance: She is well-developed.  HENT:     Head: Normocephalic and atraumatic.     Right Ear: External ear normal. A middle ear effusion is present. Tympanic membrane is erythematous. Tympanic membrane is not perforated.     Left Ear: Hearing, ear canal and external ear normal. A middle ear effusion is present. Tympanic membrane is not perforated or erythematous.     Nose: Nose normal.     Right Turbinates: Not pale.     Right Sinus: No maxillary sinus tenderness or frontal sinus tenderness.     Left Sinus: No maxillary sinus tenderness or frontal sinus tenderness.     Mouth/Throat:     Lips: Pink.     Mouth: Mucous membranes are moist. No injury, lacerations, oral lesions or angioedema.     Pharynx: Oropharynx is clear. Uvula midline. No posterior oropharyngeal erythema.     Tonsils: No tonsillar exudate or tonsillar abscesses.  Eyes:     General: Lids are normal. Vision grossly intact.     Conjunctiva/sclera: Conjunctivae normal.     Pupils: Pupils are equal, round, and reactive to light.  Neck:     Thyroid: No thyroid mass, thyromegaly or thyroid tenderness.  Cardiovascular:     Rate and Rhythm: Normal rate and regular rhythm. No extrasystoles are present.    Pulses: Normal pulses.     Heart sounds: Normal heart sounds.  Pulmonary:     Effort: Pulmonary effort is normal.     Breath sounds: Normal breath sounds. No  stridor, decreased air movement or transmitted upper airway sounds. No decreased breath sounds, wheezing, rhonchi or rales.  Musculoskeletal:     Cervical back: Full passive range of motion without pain, normal range of motion and neck supple.  Lymphadenopathy:     Cervical: No cervical adenopathy.     Right cervical: No superficial, deep or posterior cervical adenopathy.    Left cervical: No superficial, deep or posterior cervical adenopathy.  Skin:    General: Skin is warm and  dry.     Findings: No rash.  Neurological:     Mental Status: She is alert and oriented to person, place, and time.     GCS: GCS eye subscore is 4. GCS verbal subscore is 5. GCS motor subscore is 6.     Cranial Nerves: Cranial nerves 2-12 are intact.  Psychiatric:        Attention and Perception: Attention and perception normal.        Mood and Affect: Mood and affect normal.        Speech: Speech normal.        Behavior: Behavior normal. Behavior is cooperative.        Thought Content: Thought content normal.        Cognition and Memory: Cognition normal.        Judgment: Judgment normal.    BP 116/78 (BP Location: Left Arm, Patient Position: Sitting, Cuff Size: Large)    Pulse 68    Temp 98 F (36.7 C) (Oral)    Resp 14    Ht 5' 2.01" (1.575 m)    Wt 212 lb 6.4 oz (96.3 kg)    LMP 08/27/2021 (Approximate)    SpO2 98%    BMI 38.84 kg/m  Wt Readings from Last 3 Encounters:  09/02/21 212 lb 6.4 oz (96.3 kg)  08/12/21 206 lb (93.4 kg)  06/04/21 208 lb 6.4 oz (94.5 kg)    Health Maintenance Due  Topic Date Due   Hepatitis C Screening  Never done   COVID-19 Vaccine (3 - Pfizer risk series) 10/02/2019    There are no preventive care reminders to display for this patient.   Lab Results  Component Value Date   TSH 1.20 09/02/2021   Lab Results  Component Value Date   WBC 4.7 09/02/2021   HGB 13.3 09/02/2021   HCT 39.2 09/02/2021   MCV 93.8 09/02/2021   PLT 170.0 09/02/2021   Lab Results   Component Value Date   NA 139 09/02/2021   K 3.6 09/02/2021   CO2 31 09/02/2021   GLUCOSE 76 09/02/2021   BUN 16 09/02/2021   CREATININE 0.56 09/02/2021   BILITOT 0.8 09/02/2021   ALKPHOS 58 09/02/2021   AST 15 09/02/2021   ALT 10 09/02/2021   PROT 6.3 09/02/2021   ALBUMIN 4.0 09/02/2021   CALCIUM 8.8 09/02/2021   ANIONGAP 10 03/14/2014   GFR 113.80 09/02/2021   Lab Results  Component Value Date   CHOL 151 06/27/2019   Lab Results  Component Value Date   HDL 65 06/27/2019   Lab Results  Component Value Date   LDLCALC 72 06/27/2019   Lab Results  Component Value Date   TRIG 73 06/27/2019   Lab Results  Component Value Date   CHOLHDL 2.3 06/27/2019   Lab Results  Component Value Date   HGBA1C 4.9 09/02/2021       Assessment & Plan:   Problem List Items Addressed This Visit       Respiratory   Sleep apnea     Nervous and Auditory   Non-recurrent acute serous otitis media of right ear   Relevant Medications   azithromycin (ZITHROMAX) 250 MG tablet     Other   Vitamin D deficiency   Relevant Orders   VITAMIN D 25 Hydroxy (Vit-D Deficiency, Fractures) (Completed)   History of gastric bypass   Relevant Orders   CBC with Differential/Platelet (Completed)   Comprehensive metabolic panel (Completed)   TSH (Completed)  VITAMIN D 25 Hydroxy (Vit-D Deficiency, Fractures) (Completed)   B12 (Completed)   Other fatigue - Primary    Labs and chest x ray today given URI last week. Increase hydration, expect post viral syndrome.       Relevant Orders   Hgb A1c w/o eAG (Completed)   Mononucleosis Test, Qual W/ Reflex (Completed)   Acute cough    Cough has mostly resolved non productive treated with Augmentin at video visit last week. Chest x ray today given fatigue to rule out pneumonia.       Relevant Orders   DG Chest 2 View (Completed)   B12 deficiency   Relevant Orders   B12 (Completed)   Post viral syndrome   Depression, major, single episode,  moderate (HCC)   RESOLVED: Major depressive disorder, single episode in full remission ([place])     Meds ordered this encounter  Medications   azithromycin (ZITHROMAX) 250 MG tablet    Sig: Take 2 tablets on day 1, then 1 tablet daily on days 2 through 5    Dispense:  6 tablet    Refill:  0     Return in about 3 weeks (around 09/23/2021), or if symptoms worsen or fail to improve, for at any time for any worsening symptoms, Go to Emergency room/ urgent care if worse.   Red Flags discussed. The patient was given clear instructions to go to ER or return to medical center if any red flags develop, symptoms do not improve, worsen or new problems develop. They verbalized understanding.   Marcille Buffy, FNP

## 2021-09-02 NOTE — Progress Notes (Signed)
Chest x ray is within normal limits.

## 2021-09-03 ENCOUNTER — Other Ambulatory Visit: Payer: Self-pay | Admitting: Adult Health

## 2021-09-03 DIAGNOSIS — E538 Deficiency of other specified B group vitamins: Secondary | ICD-10-CM

## 2021-09-03 DIAGNOSIS — E559 Vitamin D deficiency, unspecified: Secondary | ICD-10-CM

## 2021-09-03 LAB — MONO QUAL W/RFLX QN: Mono Qual W/Rflx Qn: NEGATIVE

## 2021-09-03 LAB — HGB A1C W/O EAG: Hgb A1c MFr Bld: 4.9 % (ref 4.8–5.6)

## 2021-09-03 MED ORDER — VITAMIN D (ERGOCALCIFEROL) 1.25 MG (50000 UNIT) PO CAPS
50000.0000 [IU] | ORAL_CAPSULE | ORAL | 1 refills | Status: DC
Start: 2021-09-03 — End: 2021-10-15

## 2021-09-03 NOTE — Progress Notes (Signed)
Orders Placed This Encounter  Procedures   B12 and Folate Panel    Standing Status:   Future    Standing Expiration Date:   09/03/2022   VITAMIN D 25 Hydroxy (Vit-D Deficiency, Fractures)    Standing Status:   Future    Standing Expiration Date:   09/03/2022       Meds ordered this encounter  Medications   Vitamin D, Ergocalciferol, (DRISDOL) 1.25 MG (50000 UNIT) CAPS capsule    Sig: Take 1 capsule (50,000 Units total) by mouth every 7 (seven) days. (taking one tablet per week) schedule  lab in office 1-2 weeks after completing prescription.    Dispense:  12 capsule    Refill:  1

## 2021-09-03 NOTE — Progress Notes (Signed)
Vitamin d is low. Your vitamin D level is low.  You may start prescription for vitamin  D 50000 units by mouth ONCE weekly for 8 weeks only. I have sent this to your pharmacy. After 12 weeks. Please call our office for a follow up visit and we can recheck level in a 3-4 months.   Hemoglobin A1C is within normal non diabetic. Mono test is negative. CBC, CMP, TSH within normal limits. B12 is low end normal, she can start over the counter B12 at 2000 mcg once daily.   Added  b12 and vitamin D lab in 3- 4 months. Please schedule lab recheck for her.

## 2021-09-05 ENCOUNTER — Encounter: Payer: Self-pay | Admitting: Adult Health

## 2021-09-09 ENCOUNTER — Ambulatory Visit: Payer: BC Managed Care – PPO | Admitting: Adult Health

## 2021-10-01 ENCOUNTER — Ambulatory Visit (INDEPENDENT_AMBULATORY_CARE_PROVIDER_SITE_OTHER): Payer: BC Managed Care – PPO | Admitting: Obstetrics

## 2021-10-01 ENCOUNTER — Encounter: Payer: Self-pay | Admitting: Obstetrics

## 2021-10-01 ENCOUNTER — Other Ambulatory Visit: Payer: Self-pay

## 2021-10-01 VITALS — BP 129/84 | HR 80 | Ht 62.0 in | Wt 205.5 lb

## 2021-10-01 DIAGNOSIS — N926 Irregular menstruation, unspecified: Secondary | ICD-10-CM

## 2021-10-01 LAB — POCT URINE PREGNANCY: Preg Test, Ur: NEGATIVE

## 2021-10-01 NOTE — Progress Notes (Signed)
GYN ENCOUNTER - MISSED MENSES ? ?SUBJECTIVE ?HPI ?Lauren [name] is a 41 y.o. [id number] who presents today with reports of a missed menstrual period with a Paragard IUD in place. She has had a negative pregnancy test at home. Her LMP was 08/27/21. Her cycles is typically 25-28 days. She has had the Paragrad for approximately a year and has not missed a period before this. She has not had any recent weight gain or loss or new stressors. She did a sinus infection a few weeks ago. She is sexually active. She does not think the Paragard has been expelled. She used a Mirena IUD for contraception in the past with no issues. ? ?ROS negative except as noted in HPI ? ?Past Medical History:  ?Diagnosis Date  ? Anxiety   ? Edema   ? ankle swelling  ? ?Past Surgical History:  ?Procedure Laterality Date  ? BARIATRIC SURGERY  02/2014  ? Gastric Sleeve  ? BASAL CELL CARCINOMA EXCISION  10/2015  ? CHOLECYSTECTOMY  02/2014  ? WISDOM TOOTH EXTRACTION    ? ?OB History  ?Gravida Para Term Preterm AB Living  ?'2 2 2     2  '$ ?SAB IAB Ectopic Multiple Live Births  ?        2  ?  ?# Outcome Date GA Lbr Len/2nd Weight Sex Delivery Anes PTL Lv  ?2 Term 04/07/07   10 lb 2 oz (4.593 kg) M CS-Unspec  N LIV  ?1 Term 02/02/02   9 lb 7 oz (4.281 kg) F CS-Unspec  N LIV  ? ? ?OBJECTIVE ?BP 129/84   Pulse 80   Ht '5\' 2"'$  (1.575 m)   Wt 205 lb 8 oz (93.2 kg)   LMP  (LMP Unknown)   BMI 37.59 kg/m?  ? ?UPT: negative ? ?Speculum exam done. Normal external genitalia, vagina, and cervix. IUD strings visualized with a normal length. ? ?ASSESSMENT ? ?1) Missed menses ? ?PLAN ? ?1) bhCG and prolactin collected. She recently had a normal TSH. Discussed perimenopausal cycle changes. Will f/u based on lab results. ? ?Lloyd Huger, CNM ?

## 2021-10-02 ENCOUNTER — Encounter: Payer: Self-pay | Admitting: Obstetrics

## 2021-10-02 LAB — HUMAN CHORIONIC GONADOTROPIN(HCG),B-SUBUNIT,QUANTITATIVE): HCG, Beta Chain, Quant, S: <1 m[IU]/mL

## 2021-10-02 LAB — PROLACTIN: Prolactin: 27.2 ng/mL — ABNORMAL HIGH (ref 4.8–23.3)

## 2021-10-14 ENCOUNTER — Encounter: Payer: Self-pay | Admitting: Adult Health

## 2021-10-14 ENCOUNTER — Other Ambulatory Visit: Payer: Self-pay

## 2021-10-14 ENCOUNTER — Ambulatory Visit (INDEPENDENT_AMBULATORY_CARE_PROVIDER_SITE_OTHER): Payer: BC Managed Care – PPO | Admitting: Adult Health

## 2021-10-14 VITALS — BP 112/80 | HR 60 | Temp 96.9°F | Ht 62.0 in | Wt 209.4 lb

## 2021-10-14 DIAGNOSIS — Z809 Family history of malignant neoplasm, unspecified: Secondary | ICD-10-CM

## 2021-10-14 DIAGNOSIS — R5383 Other fatigue: Secondary | ICD-10-CM | POA: Diagnosis not present

## 2021-10-14 DIAGNOSIS — E559 Vitamin D deficiency, unspecified: Secondary | ICD-10-CM

## 2021-10-14 DIAGNOSIS — E538 Deficiency of other specified B group vitamins: Secondary | ICD-10-CM

## 2021-10-14 DIAGNOSIS — F411 Generalized anxiety disorder: Secondary | ICD-10-CM | POA: Diagnosis not present

## 2021-10-14 LAB — COMPREHENSIVE METABOLIC PANEL
ALT: 9 U/L (ref 0–35)
AST: 15 U/L (ref 0–37)
Albumin: 4 g/dL (ref 3.5–5.2)
Alkaline Phosphatase: 56 U/L (ref 39–117)
BUN: 18 mg/dL (ref 6–23)
CO2: 29 mEq/L (ref 19–32)
Calcium: 8.9 mg/dL (ref 8.4–10.5)
Chloride: 103 mEq/L (ref 96–112)
Creatinine, Ser: 0.62 mg/dL (ref 0.40–1.20)
GFR: 110.96 mL/min (ref >60.00)
Glucose, Bld: 79 mg/dL (ref 70–99)
Potassium: 3.5 mEq/L (ref 3.5–5.1)
Sodium: 139 mEq/L (ref 135–145)
Total Bilirubin: 0.8 mg/dL (ref 0.2–1.2)
Total Protein: 6.4 g/dL (ref 6.0–8.3)

## 2021-10-14 LAB — VITAMIN D 25 HYDROXY (VIT D DEFICIENCY, FRACTURES): VITD: 37.83 ng/mL (ref 30.00–100.00)

## 2021-10-14 LAB — CBC WITH DIFFERENTIAL/PLATELET
Basophils Absolute: 0 10*3/uL (ref 0.0–0.1)
Basophils Relative: 0.8 % (ref 0.0–3.0)
Eosinophils Absolute: 0.1 10*3/uL (ref 0.0–0.7)
Eosinophils Relative: 2.2 % (ref 0.0–5.0)
HCT: 39.2 % (ref 36.0–46.0)
Hemoglobin: 13.5 g/dL (ref 12.0–15.0)
Lymphocytes Relative: 34.4 % (ref 12.0–46.0)
Lymphs Abs: 1.8 10*3/uL (ref 0.7–4.0)
MCHC: 34.4 g/dL (ref 30.0–36.0)
MCV: 93.6 fl (ref 78.0–100.0)
Monocytes Absolute: 0.4 10*3/uL (ref 0.1–1.0)
Monocytes Relative: 6.9 % (ref 3.0–12.0)
Neutro Abs: 2.9 10*3/uL (ref 1.4–7.7)
Neutrophils Relative %: 55.7 % (ref 43.0–77.0)
Platelets: 181 10*3/uL (ref 150.0–400.0)
RBC: 4.19 Mil/uL (ref 3.87–5.11)
RDW: 12.2 % (ref 11.5–15.5)
WBC: 5.2 10*3/uL (ref 4.0–10.5)

## 2021-10-14 LAB — TSH: TSH: 1.34 u[IU]/mL (ref 0.35–5.50)

## 2021-10-14 LAB — IBC + FERRITIN
Ferritin: 30.8 ng/mL (ref 10.0–291.0)
Iron: 100 ug/dL (ref 42–145)
Saturation Ratios: 32.5 % (ref 20.0–50.0)
TIBC: 308 ug/dL (ref 250.0–450.0)
Transferrin: 220 mg/dL (ref 212.0–360.0)

## 2021-10-14 LAB — B12 AND FOLATE PANEL
Folate: 6.3 ng/mL (ref >5.9)
Vitamin B-12: >1504 pg/mL — ABNORMAL HIGH (ref 211–911)

## 2021-10-14 MED ORDER — BUPROPION HCL ER (XL) 150 MG PO TB24: 300.0000 mg | ORAL_TABLET | Freq: Every day | ORAL | 0 refills | Status: DC

## 2021-10-14 NOTE — Patient Instructions (Addendum)
Fatigue ?If you have fatigue, you feel tired all the time and have a lack of energy or a lack of motivation. Fatigue may make it difficult to start or complete tasks because of exhaustion. In general, occasional or mild fatigue is often a normal response to activity or life. However, long-lasting (chronic) or extreme fatigue may be a symptom of a medical condition. ?Follow these instructions at home: ?General instructions ?Watch your fatigue for any changes. ?Go to bed and get up at the same time every day. ?Avoid fatigue by pacing yourself during the day and getting enough sleep at night. ?Maintain a healthy weight. ?Medicines ?Take over-the-counter and prescription medicines only as told by your health care provider. ?Take a multivitamin, if told by your health care provider.  ?Do not use herbal or dietary supplements unless they are approved by your health care provider. ?Activity ? ?Exercise regularly, as told by your health care provider. ?Use or practice techniques to help you relax, such as yoga, tai chi, meditation, or massage therapy. ?Eating and drinking ? ?Avoid heavy meals in the evening. ?Eat a well-balanced diet, which includes lean proteins, whole grains, plenty of fruits and vegetables, and low-fat dairy products. ?Avoid consuming too much caffeine. ?Avoid the use of alcohol. ?Drink enough fluid to keep your urine pale yellow. ?Lifestyle ?Change situations that cause you stress. Try to keep your work and personal schedule in balance. ?Do not use any products that contain nicotine or tobacco, such as cigarettes and e-cigarettes. If you need help quitting, ask your health care provider. ?Do not use drugs. ?Contact a health care provider if: ?Your fatigue does not get better. ?You have a fever. ?You suddenly lose or gain weight. ?You have headaches. ?You have trouble falling asleep or sleeping through the night. ?You feel angry, guilty, anxious, or sad. ?You are unable to have a bowel movement  (constipation). ?Your skin is dry. ?You have swelling in your legs or another part of your body. ?Get help right away if: ?You feel confused. ?Your vision is blurry. ?You feel faint or you pass out. ?You have a severe headache. ?You have severe pain in your abdomen, your back, or the area between your waist and hips (pelvis). ?You have chest pain, shortness of breath, or an irregular or fast heartbeat. ?You are unable to urinate, or you urinate less than normal. ?You have abnormal bleeding, such as bleeding from the rectum, vagina, nose, lungs, or nipples. ?You vomit blood. ?You have thoughts about hurting yourself or others. ?If you ever feel like you may hurt yourself or others, or have thoughts about taking your own life, get help right away. You can go to your nearest emergency department or call: ?Your local emergency services (911 in the [place] ?A suicide crisis helpline, such as the National Suicide Prevention Lifeline at 1-800-273-8255 or 988 in the [place] This is open 24 hours a day. ?Summary ?If you have fatigue, you feel tired all the time and have a lack of energy or a lack of motivation. ?Fatigue may make it difficult to start or complete tasks because of exhaustion. ?Long-lasting (chronic) or extreme fatigue may be a symptom of a medical condition. ?Exercise regularly, as told by your health care provider. ?Change situations that cause you stress. Try to keep your work and personal schedule in balance. ?This information is not intended to replace advice given to you by your health care provider. Make sure you discuss any questions you have with your health care provider. ?Document Revised:   02/05/2021 Document Reviewed: 05/23/2020 ?Elsevier Patient Education ? [employer]. ?Health Maintenance, Female ?Adopting a healthy lifestyle and getting preventive care are important in promoting health and wellness. Ask your health care provider about: ?The right schedule for you to have regular tests and  exams. ?Things you can do on your own to prevent diseases and keep yourself healthy. ?What should I know about diet, weight, and exercise? ?Eat a healthy diet ? ?Eat a diet that includes plenty of vegetables, fruits, low-fat dairy products, and lean protein. ?Do not eat a lot of foods that are high in solid fats, added sugars, or sodium. ?Maintain a healthy weight ?Body mass index (BMI) is used to identify weight problems. It estimates body fat based on height and weight. Your health care provider can help determine your BMI and help you achieve or maintain a healthy weight. ?Get regular exercise ?Get regular exercise. This is one of the most important things you can do for your health. Most adults should: ?Exercise for at least 150 minutes each week. The exercise should increase your heart rate and make you sweat (moderate-intensity exercise). ?Do strengthening exercises at least twice a week. This is in addition to the moderate-intensity exercise. ?Spend less time sitting. Even light physical activity can be beneficial. ?Watch cholesterol and blood lipids ?Have your blood tested for lipids and cholesterol at 41 years of age, then have this test every 5 years. ?Have your cholesterol levels checked more often if: ?Your lipid or cholesterol levels are high. ?You are older than 41 years of age. ?You are at high risk for heart disease. ?What should I know about cancer screening? ?Depending on your health history and family history, you may need to have cancer screening at various ages. This may include screening for: ?Breast cancer. ?Cervical cancer. ?Colorectal cancer. ?Skin cancer. ?Lung cancer. ?What should I know about heart disease, diabetes, and high blood pressure? ?Blood pressure and heart disease ?High blood pressure causes heart disease and increases the risk of stroke. This is more likely to develop in people who have high blood pressure readings or are overweight. ?Have your blood pressure checked: ?Every  3-5 years if you are 22-46 years of age. ?Every year if you are 51 years old or older. ?Diabetes ?Have regular diabetes screenings. This checks your fasting blood sugar level. Have the screening done: ?Once every three years after age 16 if you are at a normal weight and have a low risk for diabetes. ?More often and at a younger age if you are overweight or have a high risk for diabetes. ?What should I know about preventing infection? ?Hepatitis B ?If you have a higher risk for hepatitis B, you should be screened for this virus. Talk with your health care provider to find out if you are at risk for hepatitis B infection. ?Hepatitis C ?Testing is recommended for: ?Everyone born from [birth year] through 1965. ?Anyone with known risk factors for hepatitis C. ?Sexually transmitted infections (STIs) ?Get screened for STIs, including gonorrhea and chlamydia, if: ?You are sexually active and are younger than 41 years of age. ?You are older than 41 years of age and your health care provider tells you that you are at risk for this type of infection. ?Your sexual activity has changed since you were last screened, and you are at increased risk for chlamydia or gonorrhea. Ask your health care provider if you are at risk. ?Ask your health care provider about whether you are at high risk for HIV. Your  health care provider may recommend a prescription medicine to help prevent HIV infection. If you choose to take medicine to prevent HIV, you should first get tested for HIV. You should then be tested every 3 months for as long as you are taking the medicine. ?Pregnancy ?If you are about to stop having your period (premenopausal) and you may become pregnant, seek counseling before you get pregnant. ?Take 400 to 800 micrograms (mcg) of folic acid every day if you become pregnant. ?Ask for birth control (contraception) if you want to prevent pregnancy. ?Osteoporosis and menopause ?Osteoporosis is a disease in which the bones lose minerals and  strength with aging. This can result in bone fractures. If you are 68 years old or older, or if you are at risk for osteoporosis and fractures, ask your health care provider if you should: ?Be screened for bone los

## 2021-10-14 NOTE — Progress Notes (Signed)
? ?Acute Office Visit ? ?Subjective:  ? ? Patient ID: Lauren [name], female    DOB: 1981/04/20, 41 y.o.   MRN: [id number] ? ?Chief Complaint  ?Patient presents with  ? Follow-up  ?  F/u for Fatigue - pt reports episodes of extreme fatigue around time of menses. Pt reports it is hard to function during these episodes. Has to take naps through out the day. Pt has started Vitamin D and Vitamin B12 sublingual.  ? ? ?HPI ?Patient is in today for fatigue that has occurred in past before and she has noticed this is occuring before her menses. She has noticed she is more emotional prior to her menses.  ?She denies any heavy cycles. She had some cramps. She saw her obgyn on 09/28/2021  Lloyd Huger. She has paraguard. Pregnancy was negative. Prolactin and HCG done and negative.  ?Patient denies any suicidal or homicidal ideations or intents.  ?History of anemia in past.  ?On vitamin D.  ?Today when asked she reports Mom has a rare blood cancer - was diagnosed last year - patient reports today. Mom is in her 31's. Positive gene test daughter is unsure what.  ?Patient  denies any fever, body aches,chills, rash, chest pain, shortness of breath, nausea, vomiting, or diarrhea. Denies any abdominal pain.  ?Denies dizziness, lightheadedness, pre syncopal or syncopal episodes.  ? ? ?Past Medical History:  ?Diagnosis Date  ? Anxiety   ? Edema   ? ankle swelling  ? ? ?Past Surgical History:  ?Procedure Laterality Date  ? BARIATRIC SURGERY  02/2014  ? Gastric Sleeve  ? BASAL CELL CARCINOMA EXCISION  10/2015  ? CHOLECYSTECTOMY  02/2014  ? WISDOM TOOTH EXTRACTION    ? ? ?Family History  ?Problem Relation Age of Onset  ? Migraines Mother   ? Hypertension Mother   ? Hyperlipidemia Mother   ? Depression Mother   ? Sleep apnea Mother   ? Breast cancer Maternal Grandmother   ? Cancer Maternal Grandmother   ?     breast  ? Asthma Daughter   ? Allergies Daughter   ? Asthma Son   ? Allergies Son   ? Cancer Maternal Grandfather   ?      pancreatic   ? ? ?Social History  ? ?Socioeconomic History  ? Marital status: Single  ?  Spouse name: Not on file  ? Number of children: Not on file  ? Years of education: Not on file  ? Highest education level: Not on file  ?Occupational History  ? Not on file  ?Tobacco Use  ? Smoking status: Never  ? Smokeless tobacco: Never  ?Substance and Sexual Activity  ? Alcohol use: No  ?  Alcohol/week: 0.0 standard drinks  ? Drug use: No  ? Sexual activity: Yes  ?  Birth control/protection: I.U.D.  ?  Comment: mirena  ?Other Topics Concern  ? Not on file  ?Social History Narrative  ? Not on file  ? ?Social Determinants of Health  ? ?Financial Resource Strain: Not on file  ?Food Insecurity: Not on file  ?Transportation Needs: Not on file  ?Physical Activity: Not on file  ?Stress: Not on file  ?Social Connections: Not on file  ?Intimate Partner Violence: Not on file  ? ? ?Outpatient Medications Prior to Visit  ?Medication Sig Dispense Refill  ? hydrochlorothiazide (HYDRODIURIL) 12.5 MG tablet Take 1 tablet (12.5 mg total) by mouth daily. 90 tablet 0  ? PARAGARD INTRAUTERINE COPPER IU by Intrauterine route.    ?  Vitamin D, Ergocalciferol, (DRISDOL) 1.25 MG (50000 UNIT) CAPS capsule Take 1 capsule (50,000 Units total) by mouth every 7 (seven) days. (taking one tablet per week) schedule  lab in office 1-2 weeks after completing prescription. 12 capsule 1  ? buPROPion (WELLBUTRIN XL) 150 MG 24 hr tablet Take 1 tablet (150 mg total) by mouth daily. 90 tablet 0  ? ?No facility-administered medications prior to visit.  ? ? ?No Known Allergies ? ?Review of Systems  ?Constitutional:  Positive for fatigue. Negative for activity change, appetite change, chills, diaphoresis, fever and unexpected weight change.  ?HENT: Negative.    ?Eyes: Negative.   ?Respiratory: Negative.    ?Cardiovascular: Negative.   ?Gastrointestinal: Negative.   ?Genitourinary: Negative.   ?Musculoskeletal: Negative.   ?Hematological: Negative.    ?Psychiatric/Behavioral: Negative.    ? ?   ?Objective:  ?  ?Physical Exam ? ?BP 112/80 (BP Location: Left Arm, Patient Position: Sitting, Cuff Size: Large)   Pulse 60   Temp (!) 96.9 ?F (36.1 ?C) (Oral)   Ht '5\' 2"'$  (1.575 m)   Wt 209 lb 6.4 oz (95 kg)   LMP  (LMP Unknown)   SpO2 99%   BMI 38.30 kg/m?  ? ? ?General: Appearance:    Obese female in no acute distress  ?Eyes:    PERRL, conjunctiva/corneas clear, EOM's intact       ?Lungs:     Clear to auscultation bilaterally, respirations unlabored  ?Heart:    Normal heart rate. Normal rhythm. No murmurs, rubs, or gallops.  ?  ?MS:   All extremities are intact.  ?  ?Neurologic:   Awake, alert, oriented x 3. No apparent focal neurological           defect.   ? Abdomen: soft and non-tender without masses, organomegaly or hernias noted.  No guarding or rebound  ?Wt Readings from Last 3 Encounters:  ?10/14/21 209 lb 6.4 oz (95 kg)  ?10/01/21 205 lb 8 oz (93.2 kg)  ?09/02/21 212 lb 6.4 oz (96.3 kg)  ? ?Depression screen [facility] 2/9 10/14/2021 09/02/2021 08/12/2021 01/07/2021 09/25/2020  ?Decreased Interest 0 2 0 0 0  ?Down, Depressed, Hopeless - 0 0 1 0  ?PHQ - 2 Score 0 2 0 1 0  ?Altered sleeping - '1 1 2 '$ -  ?Tired, decreased energy - '3 2 2 '$ -  ?Change in appetite - 1 0 1 -  ?Feeling bad or failure about yourself  - 0 0 0 -  ?Trouble concentrating - '3 3 2 '$ -  ?Moving slowly or fidgety/restless - 0 1 0 -  ?Suicidal thoughts - 0 0 0 -  ?PHQ-9 Score - '10 7 8 '$ -  ?Difficult doing work/chores - Very difficult Somewhat difficult Not difficult at all -  ?Some recent data might be hidden  ?  ?GAD 7 : Generalized Anxiety Score 12/13/2018  ?Nervous, Anxious, on Edge 2  ?Control/stop worrying 3  ?Worry too much - different things 3  ?Trouble relaxing 0  ?Restless 0  ?Easily annoyed or irritable 3  ?Afraid - awful might happen 3  ?Total GAD 7 Score 14  ?Anxiety Difficulty Very difficult  ? ? GAD was done by Jake Michaelis CMA on 10/14/21. ?There are no preventive care reminders to display for  this patient. ? ?There are no preventive care reminders to display for this patient. ? ? ?Lab Results  ?Component Value Date  ? TSH 1.20 09/02/2021  ? ?Lab Results  ?Component Value Date  ? WBC 4.7  09/02/2021  ? HGB 13.3 09/02/2021  ? HCT 39.2 09/02/2021  ? MCV 93.8 09/02/2021  ? PLT 170.0 09/02/2021  ? ?Lab Results  ?Component Value Date  ? NA 139 09/02/2021  ? K 3.6 09/02/2021  ? CO2 31 09/02/2021  ? GLUCOSE 76 09/02/2021  ? BUN 16 09/02/2021  ? CREATININE 0.56 09/02/2021  ? BILITOT 0.8 09/02/2021  ? ALKPHOS 58 09/02/2021  ? AST 15 09/02/2021  ? ALT 10 09/02/2021  ? PROT 6.3 09/02/2021  ? ALBUMIN 4.0 09/02/2021  ? CALCIUM 8.8 09/02/2021  ? ANIONGAP 10 03/14/2014  ? GFR 113.80 09/02/2021  ? ?Lab Results  ?Component Value Date  ? CHOL 151 06/27/2019  ? ?Lab Results  ?Component Value Date  ? HDL 65 06/27/2019  ? ?Lab Results  ?Component Value Date  ? [place] 72 06/27/2019  ? ?Lab Results  ?Component Value Date  ? TRIG 73 06/27/2019  ? ?Lab Results  ?Component Value Date  ? CHOLHDL 2.3 06/27/2019  ? ?Lab Results  ?Component Value Date  ? HGBA1C 4.9 09/02/2021  ? ? ?   ?Assessment & Plan:  ? ?The primary encounter diagnosis was Generalized anxiety disorder. Diagnoses of Vitamin D deficiency, Other fatigue, Vitamin B12 deficiency, and Family history of cancer in mother- blood " cancer " unknown  were also pertinent to this visit.  ? ? ? ?Referral to hematology/ oncology for persistent fatigue and family history in mother of unknown blood cancer per patient, she will try to' \\find'$  out more information.  ? ?She will go back to Wellbutrin XL '300mg'$  qd she was doing well and stopped it back to 150 mg qd when her menses was off. She is having anxiety.  Discussed known black box warning for anti depression/ anxiety medication. Need to report any behavioral changes right, if any homicidal or suicidal thoughts or ideas seek medical attention right away. Call 911.  ? ? ?Meds ordered this encounter  ?Medications  ? buPROPion  (WELLBUTRIN XL) 150 MG 24 hr tablet  ?  Sig: Take 2 tablets (300 mg total) by mouth daily.  ?  Dispense:  90 tablet  ?  Refill:  0  ?  Hold for patient.  ? ?Orders Placed This Encounter  ?Procedures  ? B12 and Folate Panel  ? Vitamin D (25

## 2021-10-15 ENCOUNTER — Other Ambulatory Visit: Payer: Self-pay | Admitting: Adult Health

## 2021-10-15 ENCOUNTER — Encounter: Payer: Self-pay | Admitting: Adult Health

## 2021-10-15 DIAGNOSIS — E559 Vitamin D deficiency, unspecified: Secondary | ICD-10-CM

## 2021-10-15 LAB — INTRINSIC FACTOR ANTIBODIES: Intrinsic Factor: NEGATIVE

## 2021-10-15 MED ORDER — VITAMIN D (ERGOCALCIFEROL) 1.25 MG (50000 UNIT) PO CAPS: 50000.0000 [IU] | ORAL_CAPSULE | ORAL | 1 refills | Status: DC

## 2021-10-15 NOTE — Progress Notes (Signed)
No orders of the defined types were placed in this encounter. ?  ?Meds ordered this encounter  ?Medications  ? Vitamin D, Ergocalciferol, (DRISDOL) 1.25 MG (50000 UNIT) CAPS capsule  ?  Sig: Take 1 capsule (50,000 Units total) by mouth every 7 (seven) days. (taking one tablet per week) lab recheck in 3 months with new PCP.  ?  Dispense:  12 capsule  ?  Refill:  1  ? Vitamin D deficiency - Plan: Vitamin D, Ergocalciferol, (DRISDOL) 1.25 MG (50000 UNIT) CAPS capsule ? ?

## 2021-10-15 NOTE — Progress Notes (Signed)
Vitamin B12 is elevated, hold b12 for now. Have rechecked with new PCP.  ?Vitamin  D is low, this can contribute to poor sleep and fatigue, will send in prescription for Vitamin D at 50,000 units by mouth once every 7 days/(once weekly) for 12 weeks. Advise recheck lab Vitamin D in 1-2 weeks after completing vitamin d prescription with new PCP.  ?CBC, CMP, TSH, iron levels within normal limits.  ?Intrinsic factor still pending, she is obviously absorbing the oral b12 though and this is what intrinsic factor checks for.  ?Keep hematology follow up.  ?

## 2021-11-07 ENCOUNTER — Inpatient Hospital Stay: Payer: BC Managed Care – PPO

## 2021-11-07 ENCOUNTER — Inpatient Hospital Stay: Payer: BC Managed Care – PPO | Attending: Oncology | Admitting: Oncology

## 2021-11-07 ENCOUNTER — Encounter: Payer: Self-pay | Admitting: Oncology

## 2021-11-07 VITALS — Temp 97.7°F | Resp 18 | Ht 63.0 in | Wt 210.7 lb

## 2021-11-07 DIAGNOSIS — R609 Edema, unspecified: Secondary | ICD-10-CM | POA: Diagnosis not present

## 2021-11-07 DIAGNOSIS — Z809 Family history of malignant neoplasm, unspecified: Secondary | ICD-10-CM | POA: Diagnosis not present

## 2021-11-07 DIAGNOSIS — G4730 Sleep apnea, unspecified: Secondary | ICD-10-CM | POA: Insufficient documentation

## 2021-11-07 DIAGNOSIS — D75839 Thrombocytosis, unspecified: Secondary | ICD-10-CM

## 2021-11-07 DIAGNOSIS — R61 Generalized hyperhidrosis: Secondary | ICD-10-CM | POA: Diagnosis not present

## 2021-11-07 DIAGNOSIS — R5383 Other fatigue: Secondary | ICD-10-CM | POA: Insufficient documentation

## 2021-11-07 DIAGNOSIS — Z803 Family history of malignant neoplasm of breast: Secondary | ICD-10-CM | POA: Insufficient documentation

## 2021-11-07 DIAGNOSIS — Z79899 Other long term (current) drug therapy: Secondary | ICD-10-CM | POA: Diagnosis not present

## 2021-11-07 DIAGNOSIS — Z85828 Personal history of other malignant neoplasm of skin: Secondary | ICD-10-CM | POA: Diagnosis not present

## 2021-11-07 NOTE — Progress Notes (Signed)
?Hematology/Oncology Consult note ?[phone] [phone] ?  ? ?   ? ? ?Patient Care Team: ?Shambley, Fisher, CNM (Inactive) as Midwife (Obstetrics and Gynecology) ?Dasher, Rayvon Char, MD (Dermatology) ? ?REFERRING PROVIDER: ?Doreen Beam, F*  ?CHIEF COMPLAINTS/REASON FOR VISIT:  ?Evaluation of fatigue, thrombocytosis ? ?HISTORY OF PRESENTING ILLNESS:  ? ?Lauren [name] is a  41 y.o.  female with PMH listed below was seen in consultation at the request of  Flinchum, Kelby Aline, F*  for evaluation of fatigue, thrombocytosis. ? ?Patient was referred for evaluation of thrombocytosis.  10/15/2019 3, patient had a CBC done which showed normal white count, hemoglobin and platelet count.   ?Patient reports fatigue, she works at home and feels that she needs to take  naps during the day. ?She has a history of sleep apnea and has had a recent reevaluation.  Per patient, patient she had borderline sleep apnea, not enough to qualify for additional therapy.   ? ?Patient has anxiety, she takes Wellbutrin.  Denies any depression. ?Patient tells me that her mother was recently diagnosed with thrombocytosis with JAK2 positive.  Her mother need to take hydroxyurea.  She is concerned that she may also have this condition and wanted be checked. ?Denies any unintentional weight loss, fever.  Endorses night sweats once every couple of weeks. ? ? ?Review of Systems  ?Constitutional:  Positive for fatigue. Negative for appetite change, chills and fever.  ?HENT:   Negative for hearing loss and voice change.   ?Eyes:  Negative for eye problems.  ?Respiratory:  Negative for chest tightness and cough.   ?Cardiovascular:  Negative for chest pain.  ?Gastrointestinal:  Negative for abdominal distention, abdominal pain and blood in stool.  ?Endocrine: Negative for hot flashes.  ?Genitourinary:  Negative for difficulty urinating and frequency.   ?Musculoskeletal:  Negative for arthralgias.  ?Skin:  Negative for  itching and rash.  ?Neurological:  Negative for extremity weakness.  ?Hematological:  Negative for adenopathy.  ?Psychiatric/Behavioral:  Negative for confusion.   ? ?MEDICAL HISTORY:  ?Past Medical History:  ?Diagnosis Date  ? Anxiety   ? Edema   ? ankle swelling  ? ? ?SURGICAL HISTORY: ?Past Surgical History:  ?Procedure Laterality Date  ? BARIATRIC SURGERY  02/2014  ? Gastric Sleeve  ? BASAL CELL CARCINOMA EXCISION  10/2015  ? CHOLECYSTECTOMY  02/2014  ? WISDOM TOOTH EXTRACTION    ? ? ?SOCIAL HISTORY: ?Social History  ? ?Socioeconomic History  ? Marital status: Single  ?  Spouse name: Not on file  ? Number of children: Not on file  ? Years of education: Not on file  ? Highest education level: Not on file  ?Occupational History  ? Not on file  ?Tobacco Use  ? Smoking status: Never  ? Smokeless tobacco: Never  ?Substance and Sexual Activity  ? Alcohol use: No  ?  Alcohol/week: 0.0 standard drinks  ? Drug use: No  ? Sexual activity: Yes  ?  Birth control/protection: I.U.D.  ?  Comment: mirena  ?Other Topics Concern  ? Not on file  ?Social History Narrative  ? Not on file  ? ?Social Determinants of Health  ? ?Financial Resource Strain: Not on file  ?Food Insecurity: Not on file  ?Transportation Needs: Not on file  ?Physical Activity: Not on file  ?Stress: Not on file  ?Social Connections: Not on file  ?Intimate Partner Violence: Not on file  ? ? ?FAMILY HISTORY: ?Family History  ?Problem Relation Age of Onset  ?  Cancer Mother   ? Migraines Mother   ? Hypertension Mother   ? Hyperlipidemia Mother   ? Depression Mother   ? Sleep apnea Mother   ? Cancer Maternal Grandmother   ?     breast  ? Breast cancer Maternal Grandmother   ? Pancreatic cancer Maternal Grandmother   ? Non-Hodgkin's lymphoma Maternal Grandfather   ? Cancer Maternal Grandfather   ?     pancreatic   ? Asthma Daughter   ? Allergies Daughter   ? Asthma Son   ? Allergies Son   ? ? ?ALLERGIES:  has No Known Allergies. ? ?MEDICATIONS:  ?Current Outpatient  Medications  ?Medication Sig Dispense Refill  ? buPROPion (WELLBUTRIN XL) 150 MG 24 hr tablet Take 2 tablets (300 mg total) by mouth daily. 90 tablet 0  ? hydrochlorothiazide (HYDRODIURIL) 12.5 MG tablet Take 1 tablet (12.5 mg total) by mouth daily. 90 tablet 0  ? PARAGARD INTRAUTERINE COPPER IU by Intrauterine route.    ? Saccharomyces boulardii (PROBIOTIC) 250 MG CAPS Take by mouth.    ? Vitamin D, Ergocalciferol, (DRISDOL) 1.25 MG (50000 UNIT) CAPS capsule Take 1 capsule (50,000 Units total) by mouth every 7 (seven) days. (taking one tablet per week) lab recheck in 3 months with new PCP. 12 capsule 1  ? ?No current facility-administered medications for this visit.  ? ? ? ?PHYSICAL EXAMINATION: ?ECOG PERFORMANCE STATUS: 1 - Symptomatic but completely ambulatory ?Vitals:  ? 11/07/21 1110  ?Resp: 18  ?Temp: 97.7 ?F (36.5 ?C)  ? ?Filed Weights  ? 11/07/21 1110  ?Weight: 210 lb 11.2 oz (95.6 kg)  ? ? ?Physical Exam ?Constitutional:   ?   General: She is not in acute distress. ?HENT:  ?   Head: Normocephalic and atraumatic.  ?Eyes:  ?   General: No scleral icterus. ?Cardiovascular:  ?   Rate and Rhythm: Normal rate and regular rhythm.  ?   Heart sounds: Normal heart sounds.  ?Pulmonary:  ?   Effort: Pulmonary effort is normal. No respiratory distress.  ?   Breath sounds: No wheezing.  ?Abdominal:  ?   General: Bowel sounds are normal. There is no distension.  ?   Palpations: Abdomen is soft.  ?Musculoskeletal:     ?   General: No deformity. Normal range of motion.  ?   Cervical back: Normal range of motion and neck supple.  ?Skin: ?   General: Skin is warm and dry.  ?   Findings: No erythema or rash.  ?Neurological:  ?   Mental Status: She is alert and oriented to person, place, and time. Mental status is at baseline.  ?   Cranial Nerves: No cranial nerve deficit.  ?   Coordination: Coordination normal.  ?Psychiatric:     ?   Mood and Affect: Mood normal.  ? ? ?LABORATORY DATA:  ?I have reviewed the data as listed ?Lab  Results  ?Component Value Date  ? WBC 5.2 10/14/2021  ? HGB 13.5 10/14/2021  ? HCT 39.2 10/14/2021  ? MCV 93.6 10/14/2021  ? PLT 181.0 10/14/2021  ? ?Recent Labs  ?  04/29/21 ?0092 09/02/21 ?3300 10/14/21 ?7622  ?NA 141 139 139  ?K 3.5 3.6 3.5  ?CL 104 104 103  ?CO2 '29 31 29  '$ ?GLUCOSE 83 76 79  ?BUN '14 16 18  '$ ?CREATININE 0.63 0.56 0.62  ?CALCIUM 9.0 8.8 8.9  ?PROT 6.4 6.3 6.4  ?ALBUMIN 4.0 4.0 4.0  ?AST '17 15 15  '$ ?ALT 11  10 9  ?ALKPHOS 61 58 56  ?BILITOT 0.8 0.8 0.8  ? ?Iron/TIBC/Ferritin/ %Sat ?   ?Component Value Date/Time  ? IRON 100 10/14/2021 0831  ? TIBC 308.0 10/14/2021 0831  ? FERRITIN 30.8 10/14/2021 0831  ? IRONPCTSAT 32.5 10/14/2021 0831  ?  ? ? ?RADIOGRAPHIC STUDIES: ?I have personally reviewed the radiological images as listed and agreed with the findings in the report. ?No results found. ? ? ? ?ASSESSMENT & PLAN:  ?1. Thrombocytosis   ?2. Family history of cancer   ? ?#Recent CBC was reviewed and discussed with patient.  I reassured patient that she does not have any abnormality of CBC. ?#Fatigue, patient has low vitamin D level which may contribute to her symptoms.  Patient has been advised by primary care provider to start vitamin D supplementation.  We also discussed that mood disorder sometimes may also contribute to fatigue.   ? ?Essential thrombocythemia is on the myeloproliferative disease spectrum, and in general is not known to be caused by hereditary mutations.  I do not feel it is necessary to screen JAK2 gene.  Patient is quite concerned that fatigue may be secondary to some underlying undiagnosed condition.  I will check JAK2 gene and hopefully negative mutation status can offer peace of mind.  Patient agrees with plan. ? ?#Patient has positive family history of cancer.  She is interested in genetic counseling.  Will refer. ?Orders Placed This Encounter  ?Procedures  ? JAK2 V617F rfx CALR/MPL/E12-15  ?  Standing Status:   Future  ?  Standing Expiration Date:   11/08/2022  ? JAK2 V617F,  reflex to E12-15  ?  Standing Status:   Future  ?  Standing Expiration Date:   11/08/2022  ? JAK2 genotypr  ?  Standing Status:   Future  ?  Number of Occurrences:   1  ?  Standing Expiration Date:   11/08/2022

## 2021-11-12 LAB — JAK2 GENOTYPR

## 2021-11-26 ENCOUNTER — Inpatient Hospital Stay: Payer: BC Managed Care – PPO | Attending: Oncology | Admitting: Licensed Clinical Social Worker

## 2021-11-26 ENCOUNTER — Encounter: Payer: Self-pay | Admitting: Licensed Clinical Social Worker

## 2021-11-26 ENCOUNTER — Inpatient Hospital Stay: Payer: BC Managed Care – PPO

## 2021-11-26 DIAGNOSIS — Z803 Family history of malignant neoplasm of breast: Secondary | ICD-10-CM

## 2021-11-26 DIAGNOSIS — Z800 Family history of malignant neoplasm of digestive organs: Secondary | ICD-10-CM | POA: Diagnosis not present

## 2021-11-26 NOTE — Progress Notes (Signed)
REFERRING PROVIDER: ?Earlie Server, MD ?[place],  [place] [address] ? ?PRIMARY PROVIDER:  ?No primary care provider on file. ? ?PRIMARY REASON FOR VISIT:  ?1. Family history of breast cancer   ?2. Family history of pancreatic cancer   ? ? ? ?HISTORY OF PRESENT ILLNESS:   ?Ms. Lauren [name], a 41 y.o. female, was seen for a [place] cancer genetics consultation at the request of Dr. Tasia Catchings due to a family history of cancer.  Ms. Roussin presents to clinic today to discuss the possibility of a hereditary predisposition to cancer, genetic testing, and to further clarify her future cancer risks, as well as potential cancer risks for family members.  ? ?Ms. Puzio is a 41 y.o. female with no personal history of cancer.   ? ?CANCER HISTORY:  ?Oncology History  ? No history exists.  ? ? ? ?RISK FACTORS:  ?Menarche was at age 20.  ?First live birth at age 33.  ?OCP use for approximately 0 years.  ?Ovaries intact: yes.  ?Hysterectomy: no.  ?Menopausal status: premenopausal.  ?HRT use: 0 years. ?Colonoscopy: no; not examined. ?Mammogram within the last year: yes. ?Number of breast biopsies: 0. ?Up to date with pelvic exams: yes ?  ? ?FAMILY HISTORY:  ?We obtained a detailed, 4-generation family history.  Significant diagnoses are listed below: ? ?Family History  ?Problem Relation Age of Onset  ? Cancer Mother   ?     essential thrombocythemia, JAK2+  ? Migraines Mother   ? Hypertension Mother   ? Hyperlipidemia Mother   ? Depression Mother   ? Sleep apnea Mother   ? Breast cancer Maternal Aunt   ?     dx late 15s  ? Breast cancer Maternal Grandmother   ?     dx 36s  ? Pancreatic cancer Maternal Grandmother   ?     dx 78s d. 84s  ? Non-Hodgkin's lymphoma Maternal Grandfather   ?     d. late 57s  ? Asthma Daughter   ? Allergies Daughter   ? Asthma Son   ? Allergies Son   ? ? ?Ms. Kugler has 1 daughter, 61, 1 son, 16. She has 1 brother, 79.  ? ?Ms. Bagshaw's mother was recently diagnosed with essential  thrombocythemia. Patient had 2 maternal uncles and 1 aunt. Her aunt was recently diagnosed with breast cancer in her late 55s, she believes she had genetic testing but is unsure of result. Maternal grandmother had breast cancer in her 12s and pancreatic cancer in her 6s which she passed from. Maternal grandfather passed from Non-Hodgkin's lymphoma in his late 64s.  ? ?Ms. Mcgivern's father is living at 66, no known cancers on this side of the family. ? ?Ms. Dillingham is unaware of previous family history of genetic testing for hereditary cancer risks. Patient's maternal ancestors are of unknown descent, and paternal ancestors are of unknown descent. There is no reported Ashkenazi Jewish ancestry. There is no known consanguinity. ? ? ? ?GENETIC COUNSELING ASSESSMENT: Ms. Schuermann is a 41 y.o. female with a family history of breast and pancreatic cancer which is somewhat suggestive of a hereditary cancer syndrome and predisposition to cancer. We, therefore, discussed and recommended the following at today's visit.  ? ?DISCUSSION: We discussed that approximately 10% of breast/pancreatic cancer is hereditary. Most cases of hereditary breast and pancreatic cancer are associated with BRCA1/BRCA2 genes, although there are other genes associated with hereditary cancer as well. Cancers and risks are gene specific. We discussed  that testing is beneficial for several reasons including knowing about cancer risks, identifying potential screening and risk-reduction options that may be appropriate, and to understand if other family members could be at risk for cancer and allow them to undergo genetic testing.  ? ?We reviewed the characteristics, features and inheritance patterns of hereditary cancer syndromes. We also discussed genetic testing, including the appropriate family members to test, the process of testing, insurance coverage and turn-around-time for results. We discussed the implications of a negative, positive and/or  variant of uncertain significant result. We recommended Ms. Cressy pursue genetic testing for the [facility] CancerNext-Expanded+RNA gene panel.  ? ?The CancerNext-Expanded + RNAinsight gene panel offered by [employer] and includes sequencing and rearrangement analysis for the following 77 genes: IP, ALK, APC*, ATM*, AXIN2, BAP1, BARD1, BLM, BMPR1A, BRCA1*, BRCA2*, BRIP1*, CDC73, CDH1*,CDK4, CDKN1B, CDKN2A, CHEK2*, CTNNA1, DICER1, FANCC, FH, FLCN, GALNT12, KIF1B, LZTR1, MAX, MEN1, MET, MLH1*, MSH2*, MSH3, MSH6*, MUTYH*, NBN, NF1*, NF2, NTHL1, PALB2*, PHOX2B, PMS2*, POT1, PRKAR1A, PTCH1, PTEN*, RAD51C*, RAD51D*,RB1, RECQL, RET, SDHA, SDHAF2, SDHB, SDHC, SDHD, SMAD4, SMARCA4, SMARCB1, SMARCE1, STK11, SUFU, TMEM127, TP53*,TSC1, TSC2, VHL and XRCC2 (sequencing and deletion/duplication); EGFR, EGLN1, HOXB13, KIT, MITF, PDGFRA, POLD1 and POLE (sequencing only); EPCAM and GREM1 (deletion/duplication only). ? ?Based on Ms. Calder's family history of cancer, she meets medical criteria for genetic testing. Despite that she meets criteria, she may still have an out of pocket cost. We discussed that if her out of pocket cost for testing is over $100, the laboratory will call and confirm whether she wants to proceed with testing.  If the out of pocket cost of testing is less than $100 she will be billed by the genetic testing laboratory.  ? ?We discussed that some people do not want to undergo genetic testing due to fear of genetic discrimination.  A federal law called the Genetic Information Non-Discrimination Act (GINA) of 2008 helps protect individuals against genetic discrimination based on their genetic test results.  It impacts both health insurance and employment.  For health insurance, it protects against increased premiums, being kicked off insurance or being forced to take a test in order to be insured.  For employment it protects against hiring, firing and promoting decisions based on genetic test results.  Health  status due to a cancer diagnosis is not protected under GINA.  This law does not protect life insurance, disability insurance, or other types of insurance.  ? ?PLAN: After considering the risks, benefits, and limitations, Ms. Kloc provided informed consent to pursue genetic testing and the blood sample was sent to [facility] for analysis of the CancerNext-Expanded+RNA panel. Results should be available within approximately 2-3 weeks' time, at which point they will be disclosed by telephone to Ms. Younes, as will any additional recommendations warranted by these results. Ms. Fahr will receive a summary of her genetic counseling visit and a copy of her results once available. This information will also be available in Epic.  ? ?Ms. Donoghue's questions were answered to her satisfaction today. Our contact information was provided should additional questions or concerns arise. Thank you for the referral and allowing [place] to share in the care of your patient.  ? ?Faith Rogue, MS, LCGC ?Genetic Counselor ?[email or web address] ?Phone: [phone] ? ?The patient was seen for a total of 30 minutes in face-to-face genetic counseling.  Dr. Grayland Ormond was available for discussion regarding this case.  ? ?_______________________________________________________________________ ?For Office Staff:  ?Number of people involved in session: 1 ?Was an Intern/ student involved with  case: no ? ?

## 2021-12-16 ENCOUNTER — Encounter: Payer: Self-pay | Admitting: Licensed Clinical Social Worker

## 2021-12-16 ENCOUNTER — Telehealth: Payer: Self-pay | Admitting: Licensed Clinical Social Worker

## 2021-12-16 ENCOUNTER — Ambulatory Visit: Payer: Self-pay | Admitting: Licensed Clinical Social Worker

## 2021-12-16 DIAGNOSIS — Z1379 Encounter for other screening for genetic and chromosomal anomalies: Secondary | ICD-10-CM

## 2021-12-16 HISTORY — DX: Encounter for other screening for genetic and chromosomal anomalies: Z13.79

## 2021-12-16 NOTE — Progress Notes (Signed)
HPI:  Lauren [name] was previously seen in the [place] clinic due to a family history of cancer and concerns regarding a hereditary predisposition to cancer. Please refer to our prior cancer genetics clinic note for more information regarding our discussion, assessment and recommendations, at the time. Lauren [name] recent genetic test results were disclosed to her, as were recommendations warranted by these results. These results and recommendations are discussed in more detail below.  CANCER HISTORY:  Oncology History   No history exists.    FAMILY HISTORY:  We obtained a detailed, 4-generation family history.  Significant diagnoses are listed below: Family History  Problem Relation Age of Onset   Cancer Mother        essential thrombocythemia, JAK2+   Migraines Mother    Hypertension Mother    Hyperlipidemia Mother    Depression Mother    Sleep apnea Mother    Breast cancer Maternal Aunt        dx late 37s   Breast cancer Maternal Grandmother        dx 67s   Pancreatic cancer Maternal Grandmother        dx 41s d. 75s   Non-Hodgkin's lymphoma Maternal Grandfather        d. late 64s   Asthma Daughter    Allergies Daughter    Asthma Son    Allergies Son      Lauren [name] has 1 daughter, 79, 1 son, 66. She has 1 brother, 29.    Lauren [name] mother was recently diagnosed with essential thrombocythemia. Patient had 2 maternal uncles and 1 aunt. Her aunt was recently diagnosed with breast cancer in her late 30s, she believes she had genetic testing but is unsure of result. Maternal grandmother had breast cancer in her 42s and pancreatic cancer in her 51s which she passed from. Maternal grandfather passed from Non-Hodgkin's lymphoma in his late 65s.    Lauren [name]'s father is living at 43, no known cancers on this side of the family.   Lauren [name] is unaware of previous family history of genetic testing for hereditary cancer risks. Patient's maternal  ancestors are of unknown descent, and paternal ancestors are of unknown descent. There is no reported Ashkenazi Jewish ancestry. There is no known consanguinity.      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   The CancerNext-Expanded + RNAinsight gene panel offered by [facility] and includes sequencing and rearrangement analysis for the following 77 genes: IP, ALK, APC*, ATM*, AXIN2, BAP1, BARD1, BLM, BMPR1A, BRCA1*, BRCA2*, BRIP1*, CDC73, CDH1*,CDK4, CDKN1B, CDKN2A, CHEK2*, CTNNA1, DICER1, FANCC, FH, FLCN, GALNT12, KIF1B, LZTR1, MAX, MEN1, MET, MLH1*, MSH2*, MSH3, MSH6*, MUTYH*, NBN, NF1*, NF2, NTHL1, PALB2*, PHOX2B, PMS2*, POT1, PRKAR1A, PTCH1, PTEN*, RAD51C*, RAD51D*,RB1, RECQL, RET, SDHA, SDHAF2, SDHB, SDHC, SDHD, SMAD4, SMARCA4, SMARCB1, SMARCE1, STK11, SUFU, TMEM127, TP53*,TSC1, TSC2, VHL and XRCC2 (sequencing and deletion/duplication); EGFR, EGLN1, HOXB13, KIT, MITF, PDGFRA, POLD1 and POLE (sequencing only); EPCAM and GREM1 (deletion/duplication only).  The test report has been scanned into EPIC and is located under the Molecular Pathology section of the Results Review tab.  A portion of the result report is included below for reference.    We discussed that because current genetic testing is not perfect, it is possible there may be a gene mutation in one  of these genes that current testing cannot detect, but that chance is small.  There could be another gene that has not yet been discovered, or that we have not yet tested, that is responsible for the cancer diagnoses in the family. It is also possible there is a hereditary cause for the cancer in the family that Ms. [name] did not inherit and therefore was not identified in her testing.  Therefore, it is  important to remain in touch with cancer genetics in the future so that we can continue to offer Lauren [name] the most up to date genetic testing.   Genetic testing did identify a variant of uncertain significance (VUS) in the RET gene called c.2548G>T.  At this time, it is unknown if this variant is associated with increased cancer risk or if this is a normal finding, but most variants such as this get reclassified to being inconsequential. It should not be used to make medical management decisions. With time, we suspect the lab will determine the significance of this variant, if any. If we do learn more about it we will try to contact Lauren [name] to discuss it further. However, it is important to stay in touch with [place] periodically and keep the address and phone number up to date.  MUTYH:  MUTYH- Associated Polyposis is an autosomal recessive condition that increases the risk to develop multiple colon/duodenal polyps and increases cancer risk.   We reviewed recessive inheritance and discussed when an individual has two MUTYH pathogenic mutations, this is associated with an increased risk for adenomatous (precancerous) colon polyps.   Based on current data, the risk for colon cancer/polyps in individuals who are MUTYH carriers (only 1 mutation) is moderately increased at most.   Only 1 MUTYH pathogenic variant was detected in Lauren [name]. While this is reassuring, there is always a small chance that genetic testing did not detect all possible variants, as the technology is constantly evolving.  For MUTYH carriers with no personal history and no family history of colon cancer, data are uncertain if specailized screening is warranted (NCCN v.1.2022).  We recommend all of Lauren [name]'s family members have genetic counseling and genetic testing. .  As discussed above, having only 1 MUTYH mutation would not significantly impact their colon cancer risk/screening.  However, if any relatives have inherited 2 MUTYH  mutations (in trans), this would significantly impact their cancer risk.  About 1-2% of the [place] population carries a single MUTYH pathogenic variant, so there is a chance some relatives may have inherited MUTYH mutations from both of their parents.     ADDITIONAL GENETIC TESTING:We discussed with Ms. Mccolm that her genetic testing was fairly extensive.  If there are genes identified to increase cancer risk that can be analyzed in the future, we would be happy to discuss and coordinate this testing at that time.    CANCER SCREENING RECOMMENDATIONS: Ms. Venard test result is considered negative (normal).  This means that we have not identified a hereditary cause for her family history of cancer at this time.   While reassuring, this does not definitively rule out a hereditary predisposition to cancer. It is still possible that there could be genetic mutations that are undetectable by current technology. There could be genetic mutations in genes that have not been tested or identified to increase cancer risk.  Therefore, it is recommended she continue to follow the cancer management and screening guidelines provided by her primary healthcare provider.   An individual's cancer risk and  medical management are not determined by genetic test results alone. Overall cancer risk assessment incorporates additional factors, including personal medical history, family history, and any available genetic information that may result in a personalized plan for cancer prevention and surveillance.  Based on Ms. Kettering's personal and family history of cancer as well as her genetic test results, risk model Harriett Rush was used to estimate her risk of developing breast cancer. This estimates her lifetime risk of developing breast cancer to be approximately 12%.  The patient's lifetime breast cancer risk is a preliminary estimate based on available information using one of several models endorsed by the  [employer]  (NCCN). The NCCN recommends consideration of breast MRI screening as an adjunct to mammography for patients at high risk (defined as 20% or greater lifetime risk).  This risk estimate can change over time and may be repeated to reflect new information in her personal or family history in the future.      RECOMMENDATIONS FOR FAMILY MEMBERS:  Relatives in this family might be at some increased risk of developing cancer, over the general population risk, simply due to the family history of cancer.  We recommended female relatives in this family have a yearly mammogram beginning at age 38, or 5 years younger than the earliest onset of cancer, an annual clinical breast exam, and perform monthly breast self-exams. Female relatives in this family should also have a gynecological exam as recommended by their primary provider.  All family members should be referred for colonoscopy starting at age 54.    It is also possible there is a hereditary cause for the cancer in Ms. Moose's family that she did not inherit and therefore was not identified in her.  Based on Ms. Roettger's family history, we recommended maternal relatives have genetic counseling and testing. Ms. Mohl will let us know if we can be of any assistance in coordinating genetic counseling and/or testing for these family members.  FOLLOW-UP: Lastly, we discussed with Ms. Midgley that cancer genetics is a rapidly advancing field and it is possible that new genetic tests will be appropriate for her and/or her family members in the future. We encouraged her to remain in contact with cancer genetics on an annual basis so we can update her personal and family histories and let her know of advances in cancer genetics that may benefit this family.   Our contact number was provided. Ms. Trefry questions were answered to her satisfaction, and she knows she is welcome to call us at anytime with additional  questions or concerns.   Lauren Rogue, MS, [facility] Genetic Counselor [email or web address] Phone: [phone]

## 2021-12-16 NOTE — Telephone Encounter (Signed)
Revealed that she is a carrier of MUTYH (does not have MAP).  Revealed that a VUS in RET was identified. This normal result is reassuring. It is unlikely that there is an increased risk of cancer due to a mutation in one of these genes.  However, genetic testing is not perfect, and cannot definitively rule out a hereditary cause.  It will be important for her to keep in contact with genetics to learn if any additional testing may be needed in the future.

## 2021-12-19 DIAGNOSIS — F419 Anxiety disorder, unspecified: Secondary | ICD-10-CM | POA: Diagnosis not present

## 2021-12-19 DIAGNOSIS — R41840 Attention and concentration deficit: Secondary | ICD-10-CM | POA: Diagnosis not present

## 2022-01-01 ENCOUNTER — Other Ambulatory Visit: Payer: Self-pay | Admitting: Internal Medicine

## 2022-01-01 ENCOUNTER — Encounter: Payer: Self-pay | Admitting: Internal Medicine

## 2022-01-01 DIAGNOSIS — F411 Generalized anxiety disorder: Secondary | ICD-10-CM

## 2022-01-01 MED ORDER — BUPROPION HCL ER (XL) 150 MG PO TB24: 300.0000 mg | ORAL_TABLET | Freq: Every day | ORAL | 0 refills | Status: DC

## 2022-01-23 ENCOUNTER — Ambulatory Visit (INDEPENDENT_AMBULATORY_CARE_PROVIDER_SITE_OTHER): Payer: BC Managed Care – PPO | Admitting: Internal Medicine

## 2022-01-23 ENCOUNTER — Encounter: Payer: Self-pay | Admitting: Internal Medicine

## 2022-01-23 VITALS — BP 120/80 | HR 71 | Temp 97.9°F | Resp 14 | Ht 63.0 in | Wt 210.6 lb

## 2022-01-23 DIAGNOSIS — H9325 Central auditory processing disorder: Secondary | ICD-10-CM | POA: Diagnosis not present

## 2022-01-23 DIAGNOSIS — Z23 Encounter for immunization: Secondary | ICD-10-CM | POA: Diagnosis not present

## 2022-01-23 DIAGNOSIS — Z1231 Encounter for screening mammogram for malignant neoplasm of breast: Secondary | ICD-10-CM

## 2022-01-23 DIAGNOSIS — F419 Anxiety disorder, unspecified: Secondary | ICD-10-CM | POA: Diagnosis not present

## 2022-01-23 DIAGNOSIS — F411 Generalized anxiety disorder: Secondary | ICD-10-CM

## 2022-01-23 DIAGNOSIS — F429 Obsessive-compulsive disorder, unspecified: Secondary | ICD-10-CM | POA: Insufficient documentation

## 2022-01-23 MED ORDER — VENLAFAXINE HCL ER 37.5 MG PO CP24: 37.5000 mg | ORAL_CAPSULE | Freq: Every day | ORAL | 3 refills | Status: DC

## 2022-01-23 MED ORDER — BUPROPION HCL ER (XL) 300 MG PO TB24: 300.0000 mg | ORAL_TABLET | Freq: Every day | ORAL | 3 refills | Status: DC

## 2022-01-23 NOTE — Patient Instructions (Addendum)
My chart in 1 month about anxiety if better with change in medications   [facility] as given the info before for both  [facility] counseling and psychiatry [place]  [place] [phone]    [facility] [place]  [address]  [place] [address]  [phone]   Vitamin D3 2000 to 4000 daily   Venlafaxine Extended-Release Capsules What is this medication? VENLAFAXINE (VEN la fax een) treats depression and anxiety. It increases the amount of serotonin and norepinephrine in the brain, hormones that help regulate mood. It belongs to a group of medications called SNRIs. This medicine may be used for other purposes; ask your health care provider or pharmacist if you have questions. COMMON BRAND NAME(S): Effexor XR What should I tell my care team before I take this medication? They need to know if you have any of these conditions: Bleeding disorders Glaucoma Heart disease High blood pressure High cholesterol Kidney disease Liver disease Low levels of sodium in the blood Mania or bipolar disorder Seizures Suicidal thoughts, plans, or attempt; a previous suicide attempt by you or a family Take medications that treat or prevent blood clots Thyroid disease An unusual or allergic reaction to venlafaxine, desvenlafaxine, other medications, foods, dyes, or preservatives Pregnant or trying to get pregnant Breast-feeding How should I use this medication? Take this medication by mouth with a full glass of water. Follow the directions on the prescription label. Do not cut, crush, or chew this medication. Take it with food. If needed, the capsule may be carefully opened and the entire contents sprinkled on a spoonful of cool applesauce. Swallow the applesauce/pellet mixture right away without chewing and follow with a glass of water to ensure complete swallowing of the pellets. Try to take your medication at about  the same time each day. Do not take your medication more often than directed. Do not stop taking this medication suddenly except upon the advice of your care team. Stopping this medication too quickly may cause serious side effects or your condition may worsen. A special MedGuide will be given to you by the pharmacist with each prescription and refill. Be sure to read this information carefully each time. Talk to your care team regarding the use of this medication in children. Special care may be needed. Overdosage: If you think you have taken too much of this medicine contact a poison control center or emergency room at once. NOTE: This medicine is only for you. Do not share this medicine with others. What if I miss a dose? If you miss a dose, take it as soon as you can. If it is almost time for your next dose, take only that dose. Do not take double or extra doses. What may interact with this medication? Do not take this medication with any of the following: Certain medications for fungal infections like fluconazole, itraconazole, ketoconazole, posaconazole, voriconazole Cisapride Desvenlafaxine Dronedarone Duloxetine Levomilnacipran Linezolid MAOIs like Carbex, Eldepryl, Marplan, Nardil, and Parnate Methylene blue (injected into a vein) Milnacipran Pimozide Thioridazine This medication may also interact with the following: Amphetamines Aspirin and aspirin-like medications Certain medications for depression, anxiety, or psychotic disturbances Certain medications for migraine headaches like almotriptan, eletriptan, frovatriptan, naratriptan, rizatriptan, sumatriptan, zolmitriptan Certain medications for sleep Certain medications that treat or prevent blood clots like dalteparin, enoxaparin, warfarin Cimetidine Clozapine Diuretics Fentanyl Furazolidone Indinavir Isoniazid Lithium Metoprolol NSAIDS, medications for pain and inflammation, like ibuprofen or naproxen Other  medications that  prolong the QT interval (cause an abnormal heart rhythm) like dofetilide, ziprasidone Procarbazine Rasagiline Supplements like St. John's wort, kava kava, valerian Tramadol Tryptophan This list may not describe all possible interactions. Give your health care provider a list of all the medicines, herbs, non-prescription drugs, or dietary supplements you use. Also tell them if you smoke, drink alcohol, or use illegal drugs. Some items may interact with your medicine. What should I watch for while using this medication? Tell your care team if your symptoms do not get better or if they get worse. Visit your care team for regular checks on your progress. Because it may take several weeks to see the full effects of this medication, it is important to continue your treatment as prescribed by your care team. Watch for new or worsening thoughts of suicide or depression. This includes sudden changes in mood, behaviors, or thoughts. These changes can happen at any time but are more common in the beginning of treatment or after a change in dose. Call your care team right away if you experience these thoughts or worsening depression. Manic episodes may happen in patients with bipolar disorder who take this medication. Watch for changes in feelings or behaviors such as feeling anxious, nervous, agitated, panicky, irritable, hostile, aggressive, impulsive, severely restless, overly excited and hyperactive, or trouble sleeping. These changes can happen at any time but are more common in the beginning of treatment or after a change in dose. Call your care team right away if you notice any of these symptoms. This medication can cause an increase in blood pressure. Check with your care team for instructions on monitoring your blood pressure while taking this medication. You may get drowsy or dizzy. Do not drive, use machinery, or do anything that needs mental alertness until you know how this medication  affects you. Do not stand or sit up quickly, especially if you are an older patient. This reduces the risk of dizzy or fainting spells. Do not drink alcohol while taking this medication. Drinking alcohol may alter the effects of your medication. Serious side effects may occur. Your mouth may get dry. Chewing sugarless gum, sucking hard candy and drinking plenty of water will help. Contact your care team if the problem does not go away or is severe. What side effects may I notice from receiving this medication? Side effects that you should report to your care team as soon as possible: Allergic reactions--skin rash, itching, hives, swelling of the face, lips, tongue, or throat Bleeding--bloody or black, tar-like stools, red or dark brown urine, vomiting blood or brown material that looks like coffee grounds, small, red or purple spots on skin, unusual bleeding or bruising Heart rhythm changes--fast or irregular heartbeat, dizziness, feeling faint or lightheaded, chest pain, trouble breathing Increase in blood pressure Loss of appetite with weight loss Low sodium level--muscle weakness, fatigue, dizziness, headache, confusion Serotonin syndrome--irritability, confusion, fast or irregular heartbeat, muscle stiffness, twitching muscles, sweating, high fever, seizures, chills, vomiting, diarrhea Sudden eye pain or change in vision such as blurry vision, seeing halos around lights, vision loss Thoughts of suicide or self-harm, worsening mood, feelings of depression Side effects that usually do not require medical attention (report to your care team if they continue or are bothersome): Anxiety, nervousness Change in sex drive or performance Dizziness Dry mouth Excessive sweating Nausea Tremors or shaking Trouble sleeping This list may not describe all possible side effects. Call your doctor for medical advice about side effects. You may report side effects to  FDA at 1-800-FDA-1088. Where should I  keep my medication? Keep out of the reach of children and pets. Store at a controlled temperature between 20 and 25 degrees C (68 degrees and 77 degrees F), in a dry place. Throw away any unused medication after the expiration date. NOTE: This sheet is a summary. It may not cover all possible information. If you have questions about this medicine, talk to your doctor, pharmacist, or health care provider.  2023 Elsevier/Gold Standard (2021-02-10 00:00:00)

## 2022-01-23 NOTE — Progress Notes (Signed)
Chief Complaint  Patient presents with   Transitions Of Care    Disc recent visit with [place] attention specialist   TOC  1. 12/19/21 [facility] specialist no adhd +anxiety/ocd and classic CAPD on wellbutrin 300 mg xl but still having issues with attn/concentration and anxiety tried zoloft in the past zombie and prozac did not help will look into therapy     Review of Systems  Constitutional:  Negative for weight loss.  HENT:  Negative for hearing loss.   Eyes:  Negative for blurred vision.  Respiratory:  Negative for shortness of breath.   Cardiovascular:  Negative for chest pain.  Gastrointestinal:  Negative for abdominal pain and blood in stool.  Genitourinary:  Negative for dysuria.  Musculoskeletal:  Negative for falls and joint pain.  Skin:  Negative for rash.  Neurological:  Negative for headaches.  Psychiatric/Behavioral:  Negative for depression. The patient is nervous/anxious.    Past Medical History:  Diagnosis Date   Anxiety    Edema    ankle swelling   Past Surgical History:  Procedure Laterality Date   BARIATRIC SURGERY  02/2014   Gastric Sleeve   BASAL CELL CARCINOMA EXCISION  10/2015   CHOLECYSTECTOMY  02/2014   WISDOM TOOTH EXTRACTION     Family History  Problem Relation Age of Onset   Cancer Mother        essential thrombocythemia, JAK2+   Migraines Mother    Hypertension Mother    Hyperlipidemia Mother    Depression Mother    Sleep apnea Mother    Breast cancer Maternal Aunt        dx late 89s   Breast cancer Maternal Grandmother        dx 46s   Pancreatic cancer Maternal Grandmother        dx 33s d. 75s   Non-Hodgkin's lymphoma Maternal Grandfather        d. late 45s   Asthma Daughter    Allergies Daughter    Asthma Son    Allergies Son    Social History   Socioeconomic History   Marital status: Single    Spouse name: Not on file   Number of children: Not on file   Years of education: Not on file   Highest education level: Not on  file  Occupational History   Not on file  Tobacco Use   Smoking status: Never   Smokeless tobacco: Never  Substance and Sexual Activity   Alcohol use: No    Alcohol/week: 0.0 standard drinks of alcohol   Drug use: No   Sexual activity: Yes    Birth control/protection: I.U.D.    Comment: mirena  Other Topics Concern   Not on file  Social History Narrative   Not on file   Social Determinants of Health   Financial Resource Strain: Not on file  Food Insecurity: Not on file  Transportation Needs: Not on file  Physical Activity: Not on file  Stress: Not on file  Social Connections: Not on file  Intimate Partner Violence: Not on file   Current Meds  Medication Sig   hydrochlorothiazide (HYDRODIURIL) 12.5 MG tablet Take 1 tablet (12.5 mg total) by mouth daily.   PARAGARD INTRAUTERINE COPPER IU by Intrauterine route.   Saccharomyces boulardii (PROBIOTIC) 250 MG CAPS Take by mouth.   venlafaxine XR (EFFEXOR XR) 37.5 MG 24 hr capsule Take 1 capsule (37.5 mg total) by mouth daily with breakfast.   [DISCONTINUED] buPROPion (WELLBUTRIN XL) 150 MG 24  hr tablet Take 2 tablets (300 mg total) by mouth daily.   Allergies  Allergen Reactions   Prozac [Fluoxetine]     No help    Zoloft [Sertraline]     Zombie   Recent Results (from the past 2160 hour(s))  JAK2 genotypr     Status: None   Collection Time: 11/07/21 11:49 AM  Result Value Ref Range   JAK2 GenotypR Comment     Comment: (NOTE) Result: NEGATIVE for the JAK2 V617F mutation. Interpretation:  The G to T nucleotide change encoding the V617F mutation was not detected.  This result does not rule out the presence of the JAK2 mutation at a level below the sensitivity of detection of this assay, or the presence of other mutations within JAK2 not detected by this assay.  This result does not rule out a diagnosis of polycythemia vera, essential thrombocythemia or idiopathic myelofibrosis as the V617F mutation is not detected in  all patients with these disorders.    Director Review, JAK2 Comment     Comment: (NOTE) Loni Muse, PhD, [facility]    Director, [place] [facility] [place], [place] [address]    [phone] This test was developed and its performance characteristics determined by [facility]. It has not been cleared or approved by the Food and Drug Administration. Performed At: [facility] [address] [place], [place] [id number] Katina Degree MDPhD [id number] Performed At: [facility] [address] [place], [place] [id number] Katina Degree MDPhD [id number]    BACKGROUND: Comment     Comment: (NOTE) JAK2 is a cytoplasmic tyrosine kinase with a key role in signal transduction from multiple hematopoietic growth factor receptors. A point mutation within exon 14 of the JAK2 gene ([id number]) encoding a valine to phenylalanine substitution at position 617 of the JAK2 protein (V617F) has been identified in most patients with polycythemia vera, and in about half of those with either essential thrombocythemia or idiopathic myelofibrosis. The V617F has also been detected, although infrequently, in other myeloid disorders such as chronic myelomonocytic leukemia and chronic neutrophilic luekemia. V617F is an acquired mutation that alters a highly conserved valine present in the negative regulatory JH2 domain of the JAK2 protein and is predicted to dysregulate kinase activity. Methodology: Total genomic DNA was extracted and subjected to TaqMan real-time PCR amplification/detection. Two amplification products per sample were monitored by real-time PCR using primers/probes s pecific to JAK2 wild type (WT) and JAK2 mutant V617F. The ABI7900 Absolute Quantitation software will compare the patient specimen valuse to the standard curves and generate percent values for wild type and mutant type. In vitro studies have indicated that this assay  has an analytical sensitivity of 1%. References: Baxter EJ, Scott Phineas Real, et al. Acquired mutation of the tyrosine kinase JAK2 in human myeloproliferative disorders. Lancet. 2005 Mar 19-25; 365(9464):1054-1061. Alfonso Ramus Couedic JP. A unique clonal JAK2 mutation leading to constitutive signaling causes polycythaemia vera. Nature. 2005 Apr 28; 434(7037):1144-1148. Kralovics R, Passamonti F, Buser AS, et al. A gain-of-function mutation of JAK2 in myeloproliferative disorders. N Engl J Med. 2005 Apr 28; 352(17):1779-1790.    Objective  Body mass index is 37.31 kg/m. Wt Readings from Last 3 Encounters:  01/23/22 210 lb 9.6 oz (95.5 kg)  11/07/21 210 lb 11.2 oz (95.6 kg)  10/14/21 209 lb 6.4 oz (95 kg)   Temp Readings from Last 3 Encounters:  01/23/22 97.9 F (  36.6 C) (Oral)  11/07/21 97.7 F (36.5 C)  10/14/21 (!) 96.9 F (36.1 C) (Oral)   BP Readings from Last 3 Encounters:  01/23/22 120/80  10/14/21 112/80  10/01/21 129/84   Pulse Readings from Last 3 Encounters:  01/23/22 71  10/14/21 60  10/01/21 80    Physical Exam Vitals and nursing note reviewed.  Constitutional:      Appearance: Normal appearance. She is well-developed and well-groomed.  HENT:     Head: Normocephalic and atraumatic.  Eyes:     Conjunctiva/sclera: Conjunctivae normal.     Pupils: Pupils are equal, round, and reactive to light.  Cardiovascular:     Rate and Rhythm: Normal rate and regular rhythm.     Heart sounds: Normal heart sounds. No murmur heard. Pulmonary:     Effort: Pulmonary effort is normal.     Breath sounds: Normal breath sounds.  Abdominal:     General: Abdomen is flat. Bowel sounds are normal.     Tenderness: There is no abdominal tenderness.  Musculoskeletal:        General: No tenderness.  Skin:    General: Skin is warm and dry.  Neurological:     General: No focal deficit present.     Mental Status: She is alert and oriented to person, place, and  time. Mental status is at baseline.     Cranial Nerves: Cranial nerves 2-12 are intact.     Motor: Motor function is intact.     Coordination: Coordination is intact.     Gait: Gait is intact.  Psychiatric:        Attention and Perception: Attention and perception normal.        Mood and Affect: Mood and affect normal.        Speech: Speech normal.        Behavior: Behavior normal. Behavior is cooperative.        Thought Content: Thought content normal.        Cognition and Memory: Cognition and memory normal.        Judgment: Judgment normal.     Assessment  Plan  Anxiety - Plan: venlafaxine XR (EFFEXOR XR) 37.5 MG 24 hr capsule  Generalized anxiety disorder - Plan: buPROPion (WELLBUTRIN XL) 300 MG 24 hr tablet, venlafaxine XR (EFFEXOR XR) 37.5 MG 24 hr capsule  Obsessive-compulsive disorder, unspecified type - Plan: venlafaxine XR (EFFEXOR XR) 37.5 MG 24 hr capsule  Central auditory processing disorder (CAPD) [facility]  HM Flu shot not had  Tdap due  Covid 2/2  Mammo due 07/14/22 FH breast cancer ordered  Colonoscopy age 22  Pap 06/26/22   Provider: Dr. Olivia Mackie [facility]-Internal Medicine

## 2022-02-04 ENCOUNTER — Telehealth: Payer: Self-pay | Admitting: Internal Medicine

## 2022-02-04 NOTE — Telephone Encounter (Signed)
Pt called in stating that Dr. Olivia Mackie recommended for pt to see therapist... Pt stated that she seen therapist... Pt stated that provider/therapist is requesting for her to get tested for autism...  Pt stated that she called all around and so far no one test for adult autism... Pt was wondering if Dr. Olivia Mackie have any recommendation to places that do test adult for autism... Pt requesting callback.Marland KitchenMarland Kitchen

## 2022-02-04 NOTE — Telephone Encounter (Signed)
Did she call [place] attn specialist and see if they know or [facility]   [facility] as given the info before for both  [facility] counseling and psychiatry [place]  [place] [phone]    [facility] [place]  [address] [place] [address]  [phone]

## 2022-02-06 ENCOUNTER — Telehealth: Payer: Self-pay | Admitting: Licensed Clinical Social Worker

## 2022-02-06 NOTE — Telephone Encounter (Signed)
Patient received letter from [facility] stating that there wasn't "sufficient evidence of medical necessity" for her genetics labs on 5.3.23.   I wasn't sure of the process or requirements for these issues? Can you point the patient (or myself) in the right direction.   [place]: [phone]

## 2022-05-07 ENCOUNTER — Telehealth: Payer: BC Managed Care – PPO | Admitting: Physician Assistant

## 2022-05-07 DIAGNOSIS — B001 Herpesviral vesicular dermatitis: Secondary | ICD-10-CM

## 2022-05-07 MED ORDER — VALACYCLOVIR HCL 1 G PO TABS: 2000.0000 mg | ORAL_TABLET | Freq: Two times a day (BID) | ORAL | 0 refills | Status: AC

## 2022-05-07 NOTE — Progress Notes (Signed)
We are sorry that you are not feeling well.  Here is how we plan to help!  Based on what you have shared with me it does look like you have a viral infection.    Most cold sores or fever blisters are small fluid filled blisters around the mouth caused by herpes simplex virus.  The most common strain of the virus causing cold sores is herpes simplex virus 1.  It can be spread by skin contact, sharing eating utensils, or even sharing towels.  Cold sores are contagious to other people until dry. (Approximately 5-7 days).  Wash your hands. You can spread the virus to your eyes through handling your contact lenses after touching the lesions.  Most people experience pain at the sight or tingling sensations in their lips that may begin before the ulcers erupt.  Herpes simplex is treatable but not curable.  It may lie dormant for a long time and then reappear due to stress or prolonged sun exposure.  Many patients have success in treating their cold sores with an over the counter topical called Abreva.  You may apply the cream up to 5 times daily (maximum 10 days) until healing occurs.  If you would like to use an oral antiviral medication to speed the healing of your cold sore, I have sent a prescription to your local pharmacy Valacyclovir 2 gm take one by mouth twice a day for 1 day    HOME CARE:  Wash your hands frequently. Do not pick at or rub the sore. Don't open the blisters. Avoid kissing other people during this time. Avoid sharing drinking glasses, eating utensils, or razors. Do not handle contact lenses unless you have thoroughly washed your hands with soap and warm water! Avoid oral sex during this time.  Herpes from sores on your mouth can spread to your partner's genital area. Avoid contact with anyone who has eczema or a weakened immune system. Cold sores are often triggered by exposure to intense sunlight, use a lip balm containing a sunscreen (SPF 30 or higher).  GET HELP RIGHT AWAY  IF:  Blisters look infected. Blisters occur near or in the eye. Symptoms last longer than 10 days. Your symptoms become worse.  MAKE SURE YOU:  Understand these instructions. Will watch your condition. Will get help right away if you are not doing well or get worse.    Your e-visit answers were reviewed by a board certified advanced clinical practitioner to complete your personal care plan.  Depending upon the condition, your plan could have  Included both over the counter or prescription medications.    Please review your pharmacy choice.  Be sure that the pharmacy you have chosen is open so that you can pick up your prescription now.  If there is a problem you can message your provider in MyChart to have the prescription routed to another pharmacy.    Your safety is important to us.  If you have drug allergies check our prescription carefully.  For the next 24 hours you can use MyChart to ask questions about today's visit, request a non-urgent call back, or ask for a work or school excuse from your e-visit provider.  You will get an email in the next two days asking about your experience.  I hope that your e-visit has been valuable and will speed your recovery.  I have spent 5 minutes in review of e-visit questionnaire, review and updating patient chart, medical decision making and response to patient.     Kirra Verga M Armonii Sieh, PA-C  

## 2022-05-12 ENCOUNTER — Encounter: Payer: BC Managed Care – PPO | Admitting: Family Medicine

## 2022-05-14 IMAGING — US US BREAST*L* LIMITED INC AXILLA
1 series · 3 of 3 positions shown · non-contrast
Comparison: Previous exam(s).

CLINICAL DATA: Possible mass in the medial left breast on a recent
screening mammogram.

EXAM:
DIGITAL DIAGNOSTIC UNILATERAL LEFT MAMMOGRAM WITH TOMOSYNTHESIS AND
CAD; ULTRASOUND LEFT BREAST LIMITED
TECHNIQUE: Left digital diagnostic mammography and breast tomosynthesis was
performed. The images were evaluated with computer-aided detection.;
Targeted ultrasound examination of the left breast was performed.

[Series 1: us breast*left* limited inc axilla · 0.07mm/px · 3 of 3 slices shown]
[im 1/3]
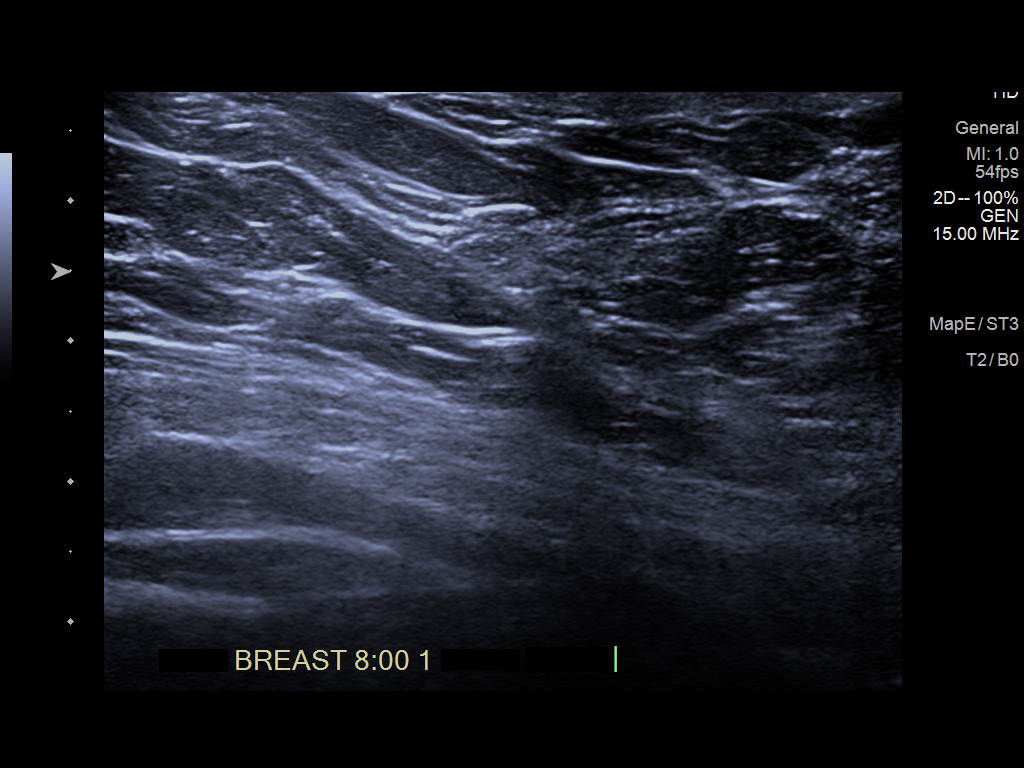
[im 2/3]
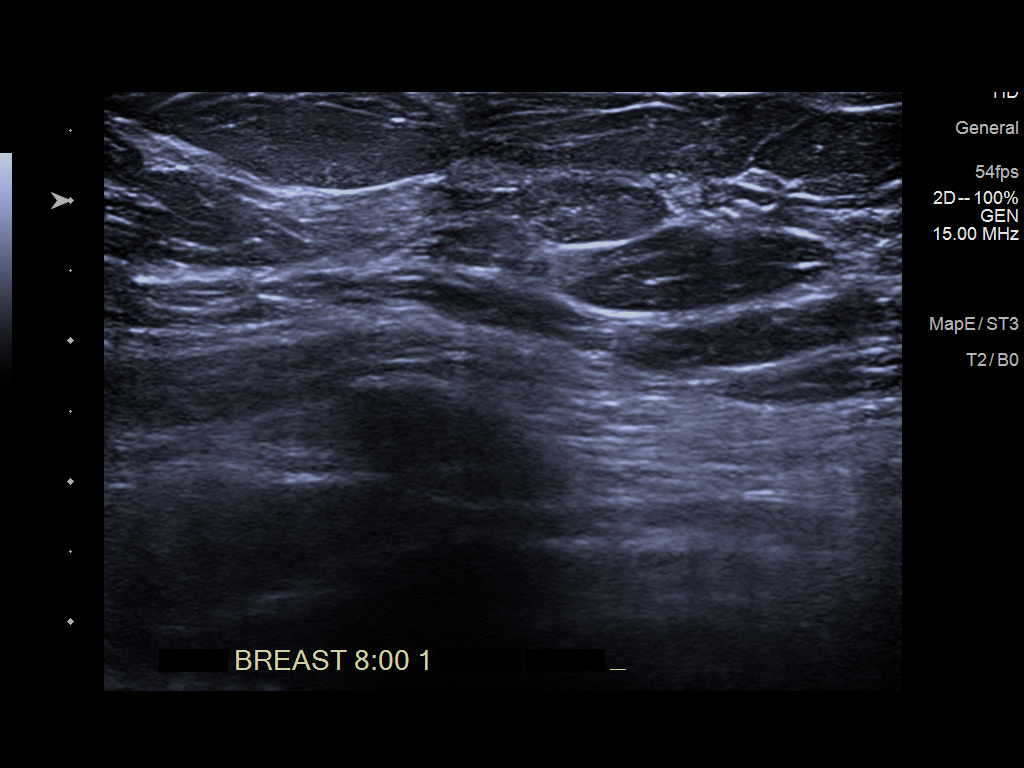
[im 3/3]
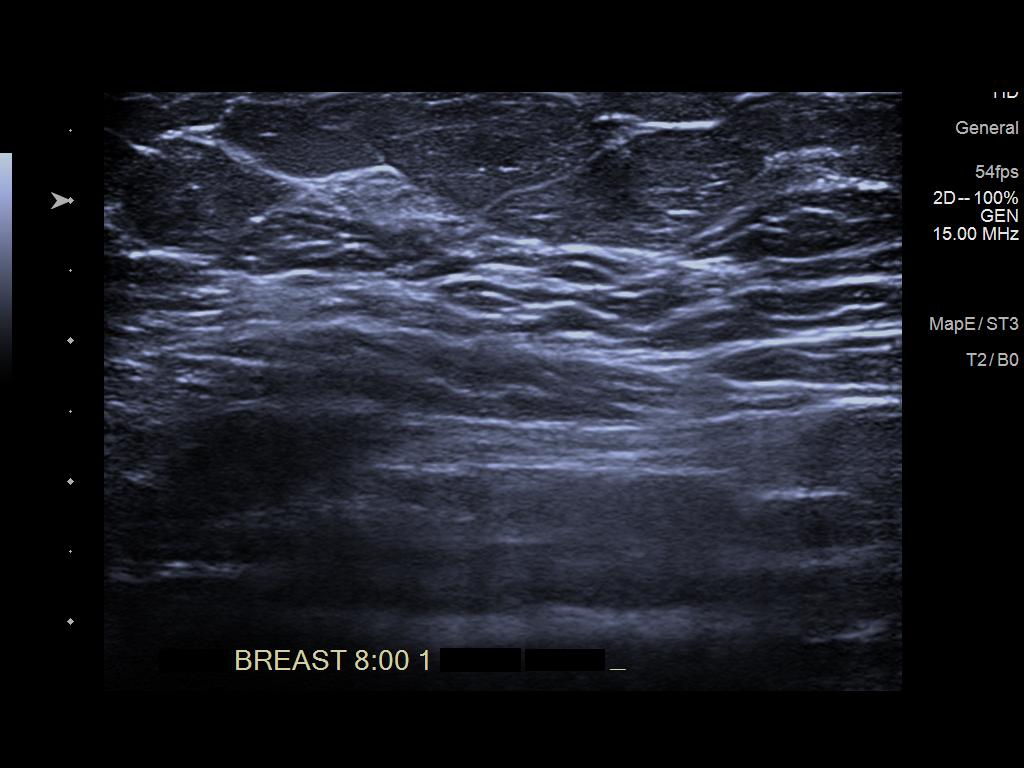

[3 of 3 positions shown; findings below may reference images not displayed]

ACR Breast Density Category b: There are scattered areas of
fibroglandular density.
FINDINGS: 3D tomographic and 2D generated spot compression images of the left
breast confirm an oval asymmetry in the posterior central breast in
the oblique projection with some circumscribed and some indistinct
margins. There is a suggestion of a corresponding asymmetry medially
in the craniocaudal view of the screening mammogram. With
spot-compression today, this is less prominent in that projection
with an appearance similar to fibroglandular tissue elsewhere in the
breast.

On physical exam, no mass is palpable in the left breast.

Targeted ultrasound is performed, showing an oval area of
fibroglandular tissue in the 8 o'clock position of the left breast,
1 cm from the nipple, corresponding to the mammographic asymmetry.
This is similar to fibroglandular tissue elsewhere in the breast.
IMPRESSION: No evidence of malignancy. The recently suspected left breast mass
is a normal island of fibroglandular tissue

RECOMMENDATION:
Bilateral screening mammogram in 1 year when due.

I have discussed the findings and recommendations with the patient.
If applicable, a reminder letter will be sent to the patient
regarding the next appointment.

BI-RADS CATEGORY  1: Negative.

## 2022-05-14 IMAGING — MG MM DIGITAL DIAGNOSTIC UNILAT*L* W/ TOMO W/ CAD
4 series · 4 of 12 positions shown · non-contrast
Comparison: Previous exam(s).

CLINICAL DATA: Possible mass in the medial left breast on a recent
screening mammogram.

EXAM:
DIGITAL DIAGNOSTIC UNILATERAL LEFT MAMMOGRAM WITH TOMOSYNTHESIS AND
CAD; ULTRASOUND LEFT BREAST LIMITED
TECHNIQUE: Left digital diagnostic mammography and breast tomosynthesis was
performed. The images were evaluated with computer-aided detection.;
Targeted ultrasound examination of the left breast was performed.

[L MLO synth-2D]
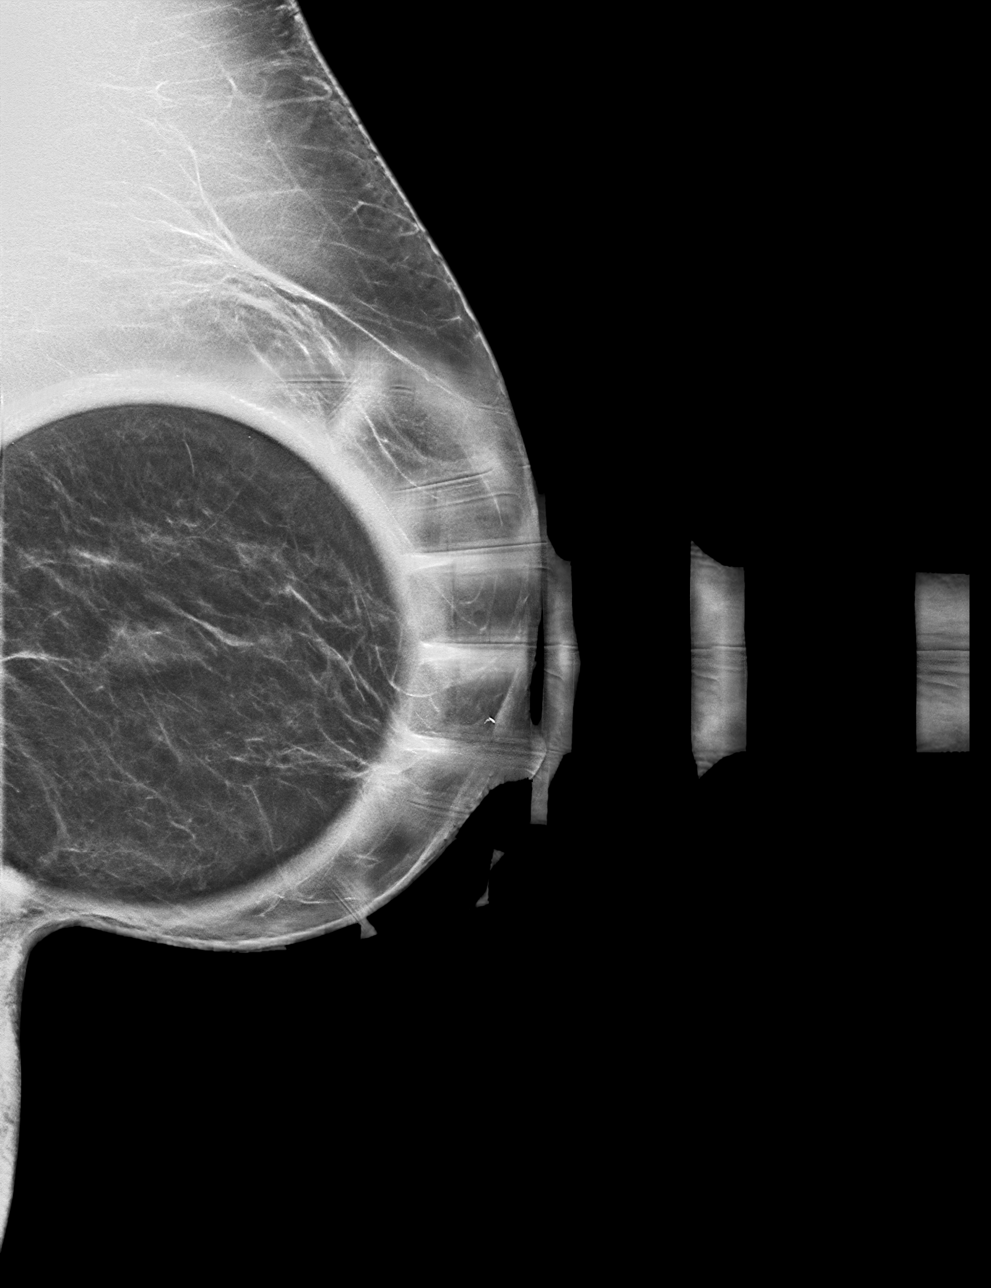

[L CC synth-2D]
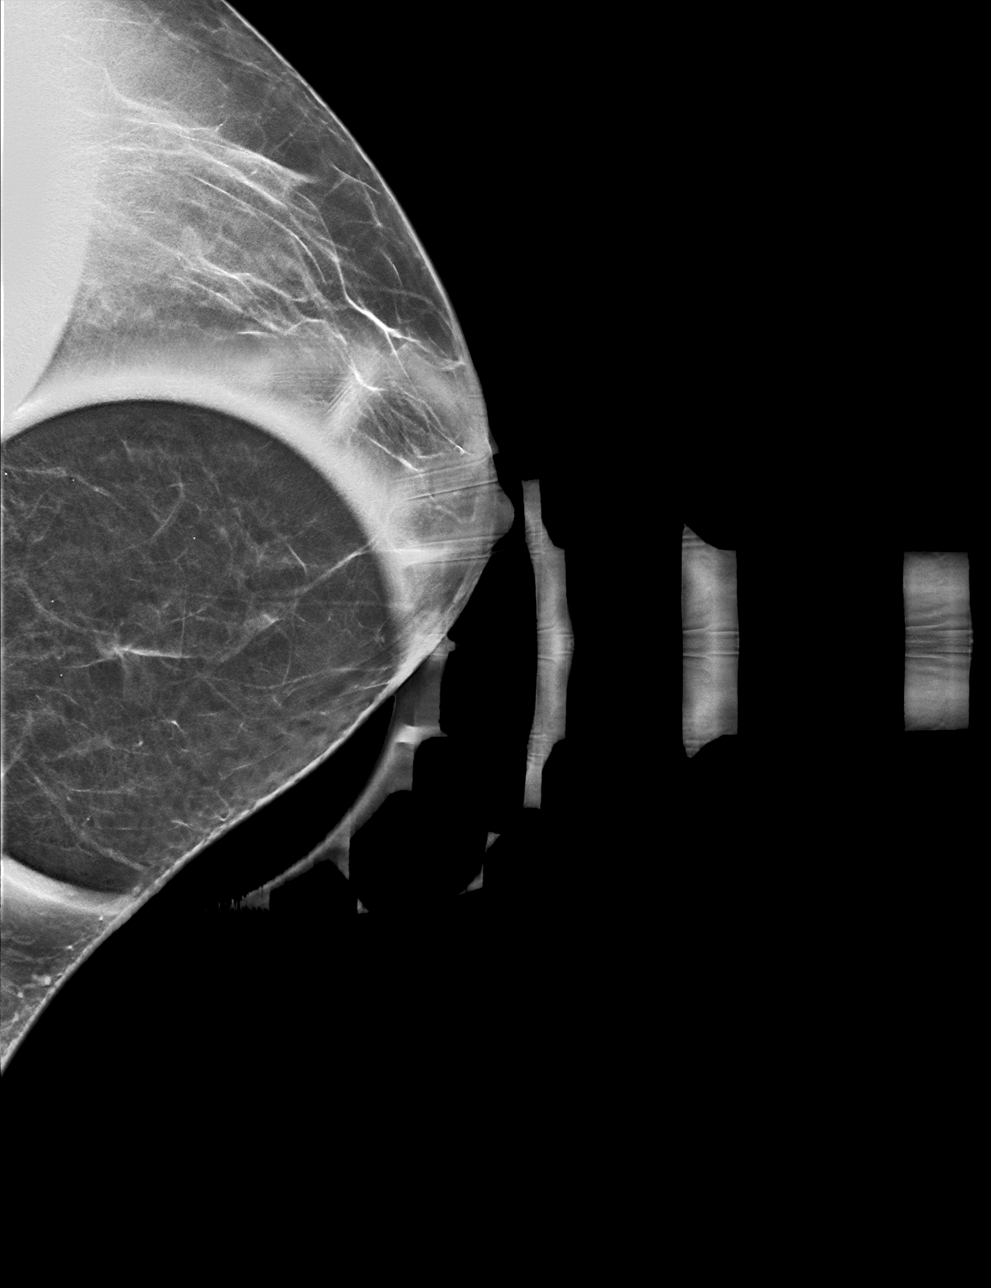

[L MLO tomo · tomo slice 27/54.0]
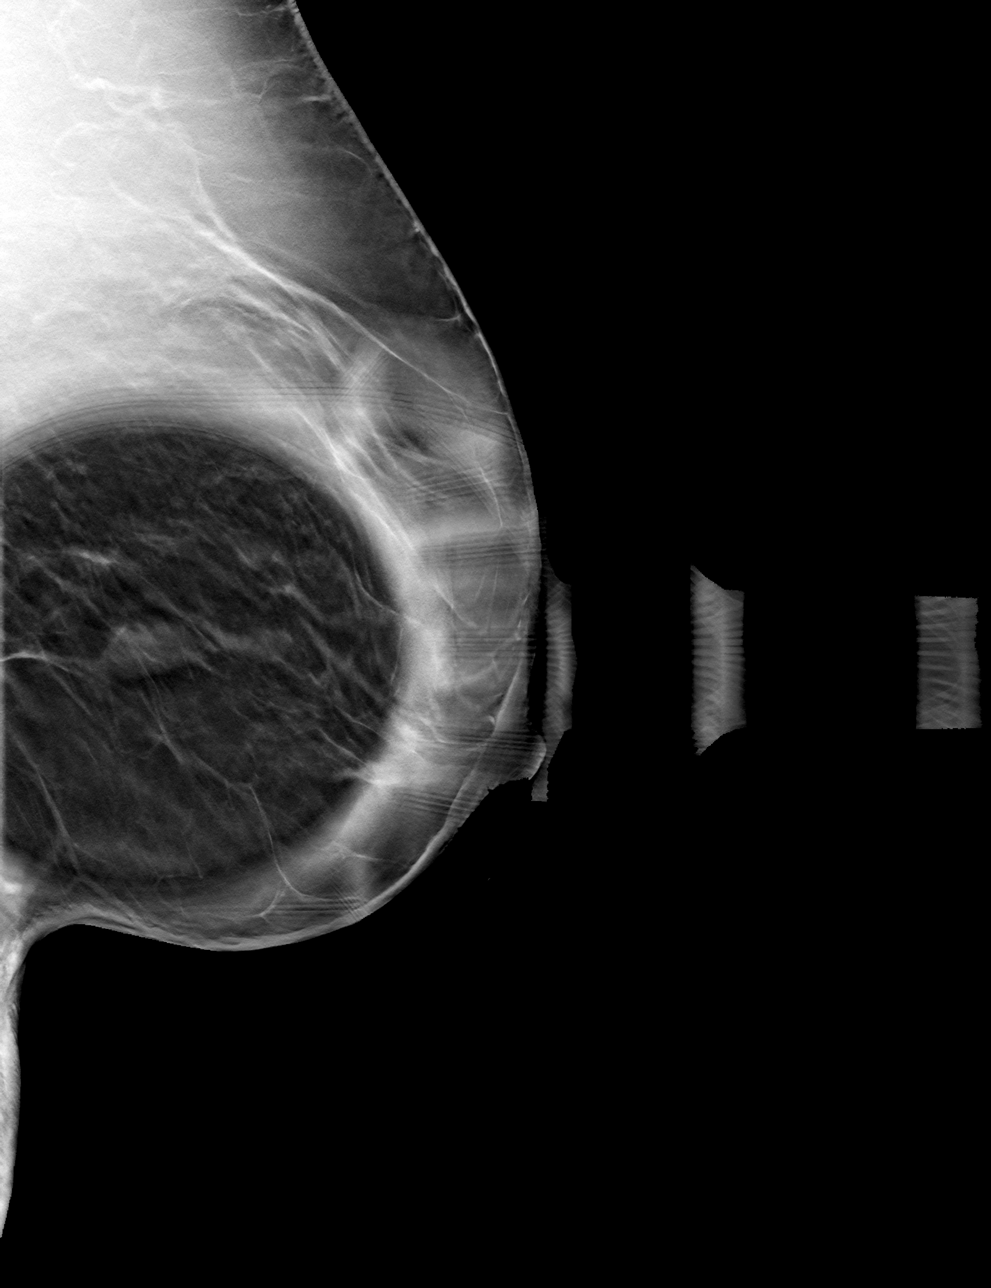

[L CC tomo · tomo slice 24/47.0]
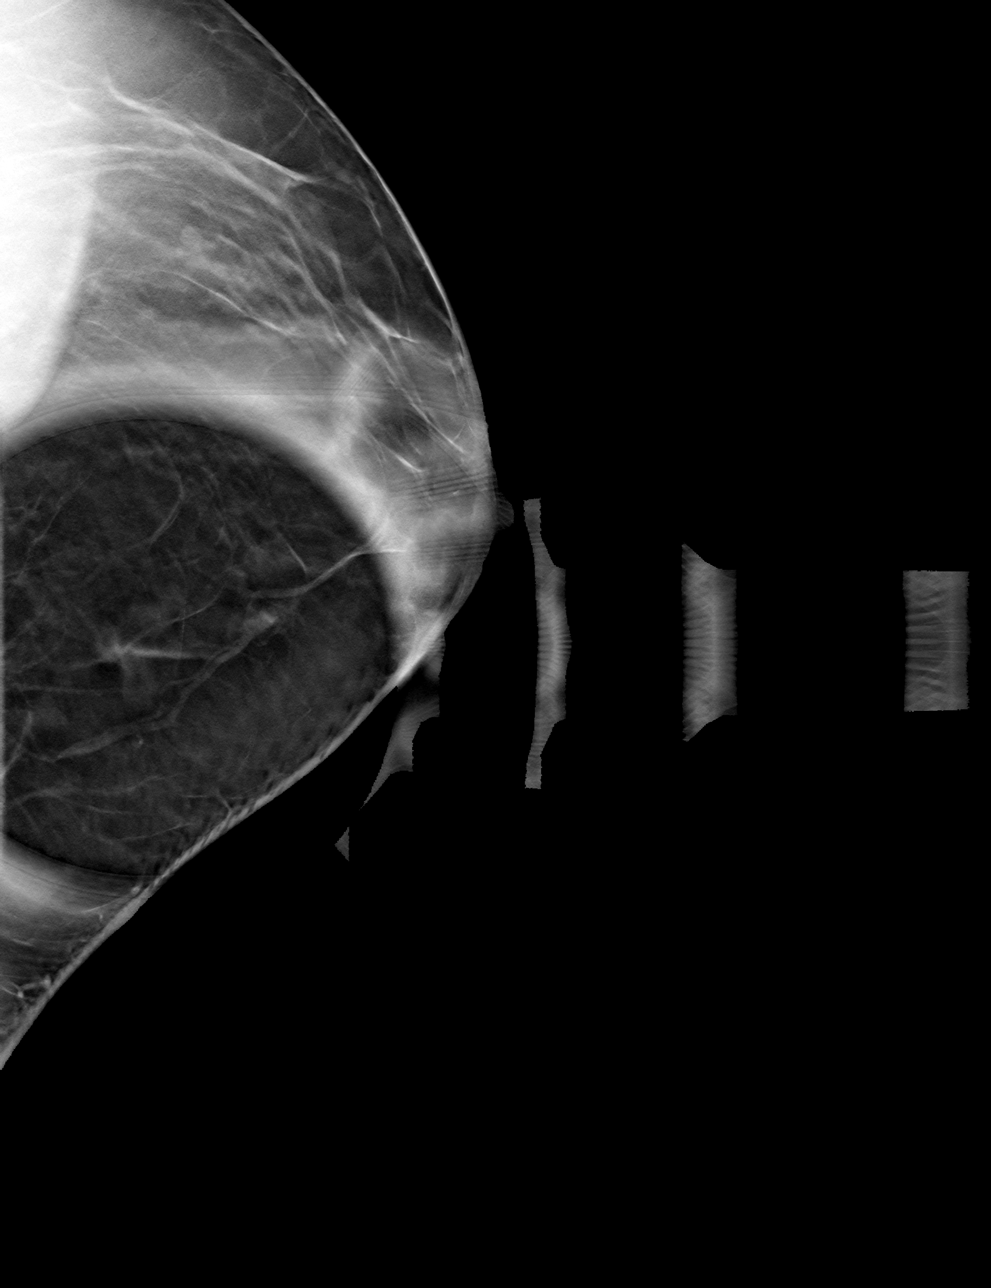

[4 of 12 positions shown; findings below may reference images not displayed]

ACR Breast Density Category b: There are scattered areas of
fibroglandular density.
FINDINGS: 3D tomographic and 2D generated spot compression images of the left
breast confirm an oval asymmetry in the posterior central breast in
the oblique projection with some circumscribed and some indistinct
margins. There is a suggestion of a corresponding asymmetry medially
in the craniocaudal view of the screening mammogram. With
spot-compression today, this is less prominent in that projection
with an appearance similar to fibroglandular tissue elsewhere in the
breast.

On physical exam, no mass is palpable in the left breast.

Targeted ultrasound is performed, showing an oval area of
fibroglandular tissue in the 8 o'clock position of the left breast,
1 cm from the nipple, corresponding to the mammographic asymmetry.
This is similar to fibroglandular tissue elsewhere in the breast.
IMPRESSION: No evidence of malignancy. The recently suspected left breast mass
is a normal island of fibroglandular tissue

RECOMMENDATION:
Bilateral screening mammogram in 1 year when due.

I have discussed the findings and recommendations with the patient.
If applicable, a reminder letter will be sent to the patient
regarding the next appointment.

BI-RADS CATEGORY  1: Negative.

## 2022-06-30 ENCOUNTER — Ambulatory Visit
Admission: RE | Admit: 2022-06-30 | Discharge: 2022-06-30 | Disposition: A | Discharge status: Home / Self Care | Payer: BC Managed Care – PPO | Source: Ambulatory Visit | Attending: Internal Medicine | Admitting: Internal Medicine

## 2022-06-30 DIAGNOSIS — Z1231 Encounter for screening mammogram for malignant neoplasm of breast: Secondary | ICD-10-CM | POA: Diagnosis not present

## 2022-09-03 ENCOUNTER — Encounter: Payer: Self-pay | Admitting: Nurse Practitioner

## 2022-09-03 ENCOUNTER — Ambulatory Visit (INDEPENDENT_AMBULATORY_CARE_PROVIDER_SITE_OTHER): Payer: Medicaid Other | Admitting: Nurse Practitioner

## 2022-09-03 VITALS — BP 122/74 | HR 86 | Temp 98.5°F | Ht 63.0 in | Wt 216.4 lb

## 2022-09-03 DIAGNOSIS — R635 Abnormal weight gain: Secondary | ICD-10-CM | POA: Diagnosis not present

## 2022-09-03 NOTE — Assessment & Plan Note (Signed)
8 lbs over the last month per patient. Weight gain of 6 lbs since last visit 8 months ago. Lab work as outlined. Will contact patient with results. Patient requesting hormones be checked as she believes this could be contributing to her weight gain. Advised patient to continue healthy diet and exercise. Decrease sodium intake. Discussed it can be normal for weight to fluctuate a few pounds from day to day. She is going to check her weight daily first thing in the morning and keep a log to bring at next appointment. Advised to seek care if gains over 5 lbs in one week.

## 2022-09-03 NOTE — Progress Notes (Signed)
Tomasita Morrow, NP-C Phone: [phone]  Lauren [name] is a 42 y.o. female who presents today for weight gain.  Patient reports gaining 8 pounds over the last month. She notes her weight was around 208 and today she is 216. She reports feeling like her face is puffy and an overall swollen feeling. She reports she does not eat much throughout the day, she has been trying to diet and eat less over the last month. She has a history of gastric sleeve surgery.   Wt Readings from Last 3 Encounters:  09/03/22 216 lb 6.4 oz (98.2 kg)  01/23/22 210 lb 9.6 oz (95.5 kg)  11/07/21 210 lb 11.2 oz (95.6 kg)    Social History   Tobacco Use  Smoking Status Never  Smokeless Tobacco Never    Current Outpatient Medications on File Prior to Visit  Medication Sig Dispense Refill   buPROPion (WELLBUTRIN XL) 300 MG 24 hr tablet Take 1 tablet (300 mg total) by mouth daily. D/ 150 mg qd 90 tablet 3   hydrochlorothiazide (HYDRODIURIL) 12.5 MG tablet Take 1 tablet (12.5 mg total) by mouth daily. 90 tablet 0   PARAGARD INTRAUTERINE COPPER IU by Intrauterine route.     Saccharomyces boulardii (PROBIOTIC) 250 MG CAPS Take by mouth.     venlafaxine XR (EFFEXOR XR) 37.5 MG 24 hr capsule Take 1 capsule (37.5 mg total) by mouth daily with breakfast. 90 capsule 3   Vitamin D, Ergocalciferol, (DRISDOL) 1.25 MG (50000 UNIT) CAPS capsule Take 1 capsule (50,000 Units total) by mouth every 7 (seven) days. (taking one tablet per week) lab recheck in 3 months with new PCP. 12 capsule 1   No current facility-administered medications on file prior to visit.    ROS see history of present illness  Objective  Physical Exam Vitals:   09/03/22 1523  BP: 122/74  Pulse: 86  Temp: 98.5 F (36.9 C)  SpO2: 98%    BP Readings from Last 3 Encounters:  09/03/22 122/74  01/23/22 120/80  10/14/21 112/80   Wt Readings from Last 3 Encounters:  09/03/22 216 lb 6.4 oz (98.2 kg)  01/23/22 210 lb 9.6 oz (95.5 kg)   11/07/21 210 lb 11.2 oz (95.6 kg)    Physical Exam Constitutional:      General: She is not in acute distress.    Appearance: Normal appearance.  HENT:     Head: Normocephalic.  Cardiovascular:     Rate and Rhythm: Normal rate and regular rhythm.     Heart sounds: Normal heart sounds.  Pulmonary:     Effort: Pulmonary effort is normal.     Breath sounds: Normal breath sounds.  Skin:    General: Skin is warm and dry.  Neurological:     General: No focal deficit present.     Mental Status: She is alert.  Psychiatric:        Mood and Affect: Mood normal.        Behavior: Behavior normal.    Assessment/Plan: Please see individual problem list.  Weight gain Assessment & Plan: 8 lbs over the last month per patient. Weight gain of 6 lbs since last visit 8 months ago. Lab work as outlined. Will contact patient with results. Patient requesting hormones be checked as she believes this could be contributing to her weight gain. Advised patient to continue healthy diet and exercise. Decrease sodium intake. Discussed it can be normal for weight to fluctuate a few pounds from day to day. She is going  to check her weight daily first thing in the morning and keep a log to bring at next appointment. Advised to seek care if gains over 5 lbs in one week.  Orders: -     CBC with Differential/Platelet -     Comprehensive metabolic panel -     TSH -     Follicle stimulating hormone -     Luteinizing hormone -     Testosterone -     Estradiol    Return in 6 weeks (on 10/14/2022) for TOC with Dr. Volanda Napoleon.   Tomasita Morrow, NP-C [place]

## 2022-09-04 LAB — CBC WITH DIFFERENTIAL/PLATELET
Basophils Absolute: 0.1 10*3/uL (ref 0.0–0.1)
Basophils Relative: 1.1 % (ref 0.0–3.0)
Eosinophils Absolute: 0.1 10*3/uL (ref 0.0–0.7)
Eosinophils Relative: 2 % (ref 0.0–5.0)
HCT: 40.4 % (ref 36.0–46.0)
Hemoglobin: 14.1 g/dL (ref 12.0–15.0)
Lymphocytes Relative: 28.3 % (ref 12.0–46.0)
Lymphs Abs: 2 10*3/uL (ref 0.7–4.0)
MCHC: 34.8 g/dL (ref 30.0–36.0)
MCV: 94.4 fl (ref 78.0–100.0)
Monocytes Absolute: 0.5 10*3/uL (ref 0.1–1.0)
Monocytes Relative: 6.5 % (ref 3.0–12.0)
Neutro Abs: 4.5 10*3/uL (ref 1.4–7.7)
Neutrophils Relative %: 62.1 % (ref 43.0–77.0)
Platelets: 221 10*3/uL (ref 150.0–400.0)
RBC: 4.28 Mil/uL (ref 3.87–5.11)
RDW: 12.7 % (ref 11.5–15.5)
WBC: 7.2 10*3/uL (ref 4.0–10.5)

## 2022-09-04 LAB — COMPREHENSIVE METABOLIC PANEL
ALT: 17 U/L (ref 0–35)
AST: 21 U/L (ref 0–37)
Albumin: 4.1 g/dL (ref 3.5–5.2)
Alkaline Phosphatase: 79 U/L (ref 39–117)
BUN: 17 mg/dL (ref 6–23)
CO2: 27 mEq/L (ref 19–32)
Calcium: 9 mg/dL (ref 8.4–10.5)
Chloride: 102 mEq/L (ref 96–112)
Creatinine, Ser: 0.71 mg/dL (ref 0.40–1.20)
GFR: 105.28 mL/min (ref >60.00)
Glucose, Bld: 87 mg/dL (ref 70–99)
Potassium: 3.7 mEq/L (ref 3.5–5.1)
Sodium: 139 mEq/L (ref 135–145)
Total Bilirubin: 0.6 mg/dL (ref 0.2–1.2)
Total Protein: 6.5 g/dL (ref 6.0–8.3)

## 2022-09-04 LAB — LUTEINIZING HORMONE: LH: 14.28 m[IU]/mL

## 2022-09-04 LAB — TSH: TSH: 0.78 u[IU]/mL (ref 0.35–5.50)

## 2022-09-04 LAB — FOLLICLE STIMULATING HORMONE: FSH: 4.6 m[IU]/mL

## 2022-09-04 LAB — ESTRADIOL: Estradiol: 241 pg/mL

## 2022-09-04 LAB — TESTOSTERONE: Testosterone: 50.48 ng/dL — ABNORMAL HIGH (ref 15.00–40.00)

## 2022-09-15 ENCOUNTER — Encounter: Payer: Self-pay | Admitting: Nurse Practitioner

## 2022-10-14 ENCOUNTER — Encounter: Payer: Self-pay | Admitting: Family Medicine

## 2022-10-14 ENCOUNTER — Ambulatory Visit (INDEPENDENT_AMBULATORY_CARE_PROVIDER_SITE_OTHER): Payer: BC Managed Care – PPO | Admitting: Family Medicine

## 2022-10-14 VITALS — BP 120/70 | HR 90 | Temp 98.8°F | Ht 62.0 in | Wt 215.2 lb

## 2022-10-14 DIAGNOSIS — Z1231 Encounter for screening mammogram for malignant neoplasm of breast: Secondary | ICD-10-CM

## 2022-10-14 DIAGNOSIS — F39 Unspecified mood [affective] disorder: Secondary | ICD-10-CM | POA: Diagnosis not present

## 2022-10-14 DIAGNOSIS — E669 Obesity, unspecified: Secondary | ICD-10-CM

## 2022-10-14 DIAGNOSIS — I10 Essential (primary) hypertension: Secondary | ICD-10-CM

## 2022-10-14 DIAGNOSIS — G479 Sleep disorder, unspecified: Secondary | ICD-10-CM

## 2022-10-14 MED ORDER — HYDROXYZINE HCL 10 MG PO TABS: 5.0000 mg | ORAL_TABLET | Freq: Every evening | ORAL | 0 refills | Status: DC | PRN

## 2022-10-14 NOTE — Patient Instructions (Addendum)
It was a pleasure meeting you today. Thank you for allowing me to take part in your health care.  Our goals for today as we discussed include:  Start atarax 5 mg at night  Schedule appointment to follow-up in 2 weeks.  Follow up 6 months for annual physical Schedule lab appointment 1 week prior to visit.  Fast for 10 hours  If you have any questions or concerns, please do not hesitate to call the office at [phone].  I look forward to our next visit and until then take care and stay safe.  Regards,   Carollee Leitz, MD   [facility]

## 2022-10-14 NOTE — Progress Notes (Signed)
SUBJECTIVE:   Chief Complaint  Patient presents with   Transitions Of Care   HPI Patient presents to clinic to transfer care.  Mood disorder Currently on Wellbutrin XL 300 mg daily.  Has been taking for a long time.  Started venlafaxine XR 37.5 mg last year.  She reports has not seen much difference in her anxiety.  She reports no difficulty getting to sleep however she does feel like she tends to worry a lot which wakes her up multiple times throughout the night.  Feels like has difficulty in social situations.  Denies any SI/HI.  She reports she was seen by an ADHD specialist last year and did not meet criteria for ADHD.  It was recommended to increase her Wellbutrin at that time to 300 mg.  She also seen with a therapist to recommended testing for autism.  Hypertension Asymptomatic.  Currently on hydrochlorothiazide 12.5 mg daily.  Tolerating medication well.  PERTINENT PMH / PSH: Hypertension Hx Bariatric surgery Mood disorder  OBJECTIVE:  BP 120/70   Pulse 90   Temp 98.8 F (37.1 C) (Oral)   Ht 5\' 2"  (1.575 m)   Wt 215 lb 3.2 oz (97.6 kg)   LMP 10/12/2022 (Exact Date)   SpO2 97%   BMI 39.36 kg/m    Physical Exam Vitals reviewed.  Constitutional:      General: She is not in acute distress.    Appearance: She is not ill-appearing.  HENT:     Head: Normocephalic.     Nose: Nose normal.  Eyes:     Conjunctiva/sclera: Conjunctivae normal.  Neck:     Thyroid: No thyromegaly or thyroid tenderness.  Cardiovascular:     Rate and Rhythm: Normal rate and regular rhythm.     Heart sounds: Normal heart sounds.  Pulmonary:     Effort: Pulmonary effort is normal.     Breath sounds: Normal breath sounds.  Abdominal:     General: Abdomen is flat. Bowel sounds are normal.     Palpations: Abdomen is soft.  Musculoskeletal:        General: Normal range of motion.     Cervical back: Normal range of motion.  Neurological:     Mental Status: She is alert and oriented to  person, place, and time. Mental status is at baseline.  Psychiatric:        Mood and Affect: Mood normal.        Behavior: Behavior normal.        Thought Content: Thought content normal.        Judgment: Judgment normal.     ASSESSMENT/PLAN:  Mood disorder (HCC) Assessment & Plan: Chronic.  Worsening anxiety.  Currently on Wellbutrin and Effexor. Denies any SI/HI Follows with therapy.  Recommend continued CBT. Not currently seeing psychiatrist.  Recommend considering psychiatry referral Continue Wellbutrin XL 300 mg daily Continue Effexor XR 37.5 mg daily Start Atarax 5 mg nightly, can increase to 5 mg twice daily if needed Follow-up in 2 weeks.  Orders: -     hydrOXYzine HCl; Take 0.5 tablets (5 mg total) by mouth at bedtime as needed.  Dispense: 30 tablet; Refill: 0  Essential (primary) hypertension Assessment & Plan: Chronic.  Stable.  Well-controlled.  Recent creatinine normal. Continue hydrochlorothiazide 12.5 mg daily   Orders: -     Comprehensive metabolic panel; Future  Obesity, Class II, BMI 35-39.9 Assessment & Plan: Chronic.  History of bariatric surgery. Will obtain blood work at annual visit.  Orders: -  Hemoglobin A1c; Future -     CBC with Differential/Platelet; Future -     Lipid panel; Future -     TSH; Future -     Vitamin B12; Future -     VITAMIN D 25 Hydroxy (Vit-D Deficiency, Fractures); Future  Breast cancer screening by mammogram -     3D Screening Mammogram, Left and Right; Future  HCM Mammogram up-to-date.  Due 12/24 Pap up-to-date.  Last completed 06/2019.  NILM, HPV negative.  Due 12/25 Mammogram today.  Due 12/24. Colonoscopy at age 60.  PDMP reviewed  Return in about 6 months (around 04/16/2023) for annual visit with fasting labs 1 week prior. Follow-up in 2 weeks with PCP   Carollee Leitz, MD

## 2022-10-20 ENCOUNTER — Encounter: Payer: Self-pay | Admitting: Family Medicine

## 2022-10-20 DIAGNOSIS — E669 Obesity, unspecified: Secondary | ICD-10-CM | POA: Insufficient documentation

## 2022-10-20 DIAGNOSIS — Z1231 Encounter for screening mammogram for malignant neoplasm of breast: Secondary | ICD-10-CM | POA: Insufficient documentation

## 2022-10-20 DIAGNOSIS — F39 Unspecified mood [affective] disorder: Secondary | ICD-10-CM | POA: Insufficient documentation

## 2022-10-20 DIAGNOSIS — E66813 Obesity, class 3: Secondary | ICD-10-CM | POA: Insufficient documentation

## 2022-10-20 NOTE — Assessment & Plan Note (Signed)
Chronic.  Stable.  Well-controlled.  Recent creatinine normal. Continue hydrochlorothiazide 12.5 mg daily

## 2022-10-20 NOTE — Assessment & Plan Note (Signed)
Chronic.  History of bariatric surgery. Will obtain blood work at annual visit.

## 2022-10-20 NOTE — Assessment & Plan Note (Signed)
Chronic.  Worsening anxiety.  Currently on Wellbutrin and Effexor. Denies any SI/HI Follows with therapy.  Recommend continued CBT. Not currently seeing psychiatrist.  Recommend considering psychiatry referral Continue Wellbutrin XL 300 mg daily Continue Effexor XR 37.5 mg daily Start Atarax 5 mg nightly, can increase to 5 mg twice daily if needed Follow-up in 2 weeks.

## 2022-11-09 ENCOUNTER — Encounter: Payer: Self-pay | Admitting: Family Medicine

## 2022-11-09 DIAGNOSIS — I10 Essential (primary) hypertension: Secondary | ICD-10-CM

## 2022-11-09 MED ORDER — HYDROCHLOROTHIAZIDE 12.5 MG PO TABS: 12.5000 mg | ORAL_TABLET | Freq: Every day | ORAL | 0 refills | Status: DC

## 2022-12-07 DIAGNOSIS — D225 Melanocytic nevi of trunk: Secondary | ICD-10-CM | POA: Diagnosis not present

## 2022-12-07 DIAGNOSIS — L720 Epidermal cyst: Secondary | ICD-10-CM | POA: Diagnosis not present

## 2022-12-07 DIAGNOSIS — D2261 Melanocytic nevi of right upper limb, including shoulder: Secondary | ICD-10-CM | POA: Diagnosis not present

## 2022-12-07 DIAGNOSIS — D2262 Melanocytic nevi of left upper limb, including shoulder: Secondary | ICD-10-CM | POA: Diagnosis not present

## 2022-12-07 DIAGNOSIS — Z85828 Personal history of other malignant neoplasm of skin: Secondary | ICD-10-CM | POA: Diagnosis not present

## 2022-12-07 DIAGNOSIS — D2272 Melanocytic nevi of left lower limb, including hip: Secondary | ICD-10-CM | POA: Diagnosis not present

## 2022-12-08 ENCOUNTER — Other Ambulatory Visit: Payer: Self-pay | Admitting: Family Medicine

## 2022-12-08 DIAGNOSIS — F39 Unspecified mood [affective] disorder: Secondary | ICD-10-CM

## 2023-01-06 ENCOUNTER — Telehealth: Payer: Self-pay

## 2023-01-06 NOTE — Telephone Encounter (Signed)
Pt left msg on triage asking if a pregnancy blood test could be ordered since her period is late (1.5 weeks). Neg UPT at home. I called pt back, taken straight to voicemail and mailbox full. Unable to leave voice msg.

## 2023-01-07 ENCOUNTER — Telehealth: Payer: BC Managed Care – PPO | Admitting: Physician Assistant

## 2023-01-07 DIAGNOSIS — N926 Irregular menstruation, unspecified: Secondary | ICD-10-CM

## 2023-01-08 ENCOUNTER — Telehealth: Payer: BC Managed Care – PPO | Admitting: Nurse Practitioner

## 2023-01-08 NOTE — Progress Notes (Signed)
Because of request for pregnancy testing which we cannot provide, I feel your condition warrants further evaluation and I recommend that you be seen for a face to face visit.  Please contact your primary care physician practice to be seen. Many offices offer virtual options to be seen via video if you are not comfortable going in person to a medical facility at this time.  NOTE: You will NOT be charged for this eVisit.  If you do not have a PCP, [facility] offers a free physician referral service available at [phone]. Our trained staff has the experience, knowledge and resources to put you in touch with a physician who is right for you.    If you are having a true medical emergency please call 911.   Your e-visit answers were reviewed by a board certified advanced clinical practitioner to complete your personal care plan.  Thank you for using e-Visits.

## 2023-01-12 ENCOUNTER — Ambulatory Visit: Payer: BC Managed Care – PPO | Admitting: Nurse Practitioner

## 2023-01-27 ENCOUNTER — Ambulatory Visit (INDEPENDENT_AMBULATORY_CARE_PROVIDER_SITE_OTHER): Payer: BC Managed Care – PPO | Admitting: Obstetrics

## 2023-01-27 ENCOUNTER — Encounter: Payer: Self-pay | Admitting: Obstetrics

## 2023-01-27 VITALS — BP 104/72 | HR 72 | Ht 62.0 in | Wt 216.0 lb

## 2023-01-27 DIAGNOSIS — R5382 Chronic fatigue, unspecified: Secondary | ICD-10-CM | POA: Diagnosis not present

## 2023-01-27 DIAGNOSIS — E669 Obesity, unspecified: Secondary | ICD-10-CM

## 2023-01-27 DIAGNOSIS — I10 Essential (primary) hypertension: Secondary | ICD-10-CM

## 2023-01-27 DIAGNOSIS — Z01419 Encounter for gynecological examination (general) (routine) without abnormal findings: Secondary | ICD-10-CM

## 2023-01-27 NOTE — Progress Notes (Signed)
ANNUAL GYNECOLOGICAL EXAM  SUBJECTIVE  HPI  Lauren [name] is a 42 y.o.-year-old [id number] who presents for an annual gynecological exam today.  She denies pelvic pain, dyspareunia, abnormal vaginal bleeding or discharge, and UTI symptoms. She has had some irregular periods over the past year. She also reports intermittent fatigue. She will have some days or weeks where she is exhausted and napping throughout the day. She states that this began about a year ago after an illness. She was recently tested for sleep apnea and was told she does not need CPAP. She would labs checked for her h/o gastric bypass surgery. She also has regular headaches.  Medical/Surgical History Past Medical History:  Diagnosis Date   Anxiety    Edema    ankle swelling   Past Surgical History:  Procedure Laterality Date   BARIATRIC SURGERY  02/2014   Gastric Sleeve   BASAL CELL CARCINOMA EXCISION  10/2015   CHOLECYSTECTOMY  02/2014   WISDOM TOOTH EXTRACTION      Social History Lives with husband and son. Feels safe there Work:works from home Exercise: walks sometimes Substances: Denies EtOH, tobacco, vape, and recreational drugs  Obstetric History OB History     Gravida  2   Para  2   Term  2   Preterm      AB      Living  2      SAB      IAB      Ectopic      Multiple      Live Births  2            GYN/Menstrual History Patient's last menstrual period was 01/23/2023. Missed a period recently  Last Pap: 06/27/2019 - NILM, negative HPV Contraception: Paragard  Prevention Endorses regular dental and eye exams Mammogram: started at 40 Colonoscopy: at 14  Current Medications Outpatient Medications Prior to Visit  Medication Sig   buPROPion (WELLBUTRIN XL) 300 MG 24 hr tablet Take 1 tablet (300 mg total) by mouth daily. D/ 150 mg qd   hydrochlorothiazide (HYDRODIURIL) 12.5 MG tablet Take 1 tablet (12.5 mg total) by mouth daily.   hydrOXYzine (ATARAX) 10 MG tablet TAKE  1/2 (ONE-HALF) TABLET BY MOUTH AT BEDTIME AS NEEDED   PARAGARD INTRAUTERINE COPPER IU by Intrauterine route.   Saccharomyces boulardii (PROBIOTIC) 250 MG CAPS Take by mouth.   venlafaxine XR (EFFEXOR XR) 37.5 MG 24 hr capsule Take 1 capsule (37.5 mg total) by mouth daily with breakfast. (Patient not taking: Reported on 01/27/2023)   Vitamin D, Ergocalciferol, (DRISDOL) 1.25 MG (50000 UNIT) CAPS capsule Take 1 capsule (50,000 Units total) by mouth every 7 (seven) days. (taking one tablet per week) lab recheck in 3 months with new PCP. (Patient not taking: Reported on 10/14/2022)   No facility-administered medications prior to visit.      [facility] - 01/27/23 0831       Pregnancy Intention Screening   Does the patient want to become pregnant in the next year? No    Does the patient's partner want to become pregnant in the next year? No    Would the patient like to discuss contraceptive options today? No      Contraception Wrap Up   Current Method IUD or IUS    End Method IUD or IUS    Contraception Counseling Provided No    How was the end contraceptive method provided? N/A            The  pregnancy intention screening data noted above was reviewed. Potential methods of contraception were discussed. The patient elected to proceed with IUD or IUS.   ROS Constitutional: Denied constitutional symptoms, night sweats, recent illness, fever, insomnia and weight loss, +fatigue  Eyes: Denied eye symptoms, eye pain, photophobia, vision change and visual disturbance.  Ears/Nose/Throat/Neck: Denied ear, nose, throat or neck symptoms, hearing loss, nasal discharge, sinus congestion and sore throat.  Cardiovascular: Denied cardiovascular symptoms, arrhythmia, chest pain/pressure, edema, exercise intolerance, orthopnea and palpitations.  Respiratory: Denied pulmonary symptoms, asthma, pleuritic pain, productive sputum, cough, dyspnea and wheezing.  Gastrointestinal: Denied, gastro-esophageal reflux,  melena, nausea and vomiting.  Genitourinary: Denied genitourinary symptoms including symptomatic vaginal discharge, pelvic relaxation issues, and urinary complaints.  Musculoskeletal: Denied musculoskeletal symptoms, stiffness, swelling, muscle weakness and myalgia.  Dermatologic: Denied dermatology symptoms, rash and scar.  Neurologic: Denied neurology symptoms, dizziness, headache, neck pain and syncope.  Psychiatric: Denied psychiatric symptoms, anxiety and depression.  Endocrine: Denied endocrine symptoms including hot flashes and night sweats.    OBJECTIVE  BP 104/72   Pulse 72   Ht 5\' 2"  (1.575 m)   Wt 216 lb (98 kg)   LMP 01/23/2023   BMI 39.51 kg/m    Physical examination General NAD, Conversant  HEENT Atraumatic; Op clear with mmm.  Normo-cephalic. Pupils reactive. Anicteric sclerae  Thyroid/Neck Smooth without nodularity or enlargement. Normal ROM.  Neck Supple.  Skin No rashes, lesions or ulceration. Normal palpated skin turgor. No nodularity.  Breasts: Declined  Lungs: Clear to auscultation.No rales or wheezes. Normal Respiratory effort, no retractions.  Heart: NSR.  No murmurs or rubs appreciated. No peripheral edema  Abdomen: Soft.  Non-tender.  No masses.  No HSM. No hernia  Extremities: Moves all appropriately.  Normal ROM for age. No lymphadenopathy.  Neuro: Oriented to PPT.  Normal mood. Normal affect.     Pelvic: Declined    ASSESSMENT  1) Annual exam 2) Fatigue 3) Headaches  PLAN 1) Physical exam as noted. Discussed healthy lifestyle choices and preventive care. Declines STI testing. Pap due 2025. Mammogram ordered by PCP.  2) Will collect labs ordered by PCP as well as ferritin, TIBC, and folate. Refer to PCP if continued fatigue. 3) Daily prevention: magnesium [phone] mg nightly, B-2  Return in one year for annual exam or as needed for concerns.   [name], CNM

## 2023-01-28 ENCOUNTER — Encounter: Payer: Self-pay | Admitting: Family Medicine

## 2023-01-28 LAB — COMPREHENSIVE METABOLIC PANEL
ALT: 14 IU/L (ref 0–32)
AST: 19 IU/L (ref 0–40)
Albumin: 4.1 g/dL (ref 3.9–4.9)
Alkaline Phosphatase: 77 IU/L (ref 44–121)
BUN/Creatinine Ratio: 17 (ref 9–23)
BUN: 12 mg/dL (ref 6–24)
Bilirubin Total: 0.6 mg/dL (ref 0.0–1.2)
CO2: 24 mmol/L (ref 20–29)
Calcium: 9 mg/dL (ref 8.7–10.2)
Chloride: 105 mmol/L (ref 96–106)
Creatinine, Ser: 0.72 mg/dL (ref 0.57–1.00)
Globulin, Total: 2.3 g/dL (ref 1.5–4.5)
Glucose: 87 mg/dL (ref 70–99)
Potassium: 3.6 mmol/L (ref 3.5–5.2)
Sodium: 142 mmol/L (ref 134–144)
Total Protein: 6.4 g/dL (ref 6.0–8.5)
eGFR: 107 mL/min/{1.73_m2} (ref >59)

## 2023-01-28 LAB — CBC WITH DIFFERENTIAL/PLATELET
Basophils Absolute: 0 10*3/uL (ref 0.0–0.2)
Basos: 1 %
EOS (ABSOLUTE): 0.1 10*3/uL (ref 0.0–0.4)
Eos: 2 %
Hematocrit: 38 % (ref 34.0–46.6)
Hemoglobin: 12.9 g/dL (ref 11.1–15.9)
Immature Grans (Abs): 0 10*3/uL (ref 0.0–0.1)
Immature Granulocytes: 0 %
Lymphocytes Absolute: 1.8 10*3/uL (ref 0.7–3.1)
Lymphs: 34 %
MCH: 32.4 pg (ref 26.6–33.0)
MCHC: 33.9 g/dL (ref 31.5–35.7)
MCV: 96 fL (ref 79–97)
Monocytes Absolute: 0.4 10*3/uL (ref 0.1–0.9)
Monocytes: 8 %
Neutrophils Absolute: 2.8 10*3/uL (ref 1.4–7.0)
Neutrophils: 55 %
Platelets: 180 10*3/uL (ref 150–450)
RBC: 3.98 x10E6/uL (ref 3.77–5.28)
RDW: 12.2 % (ref 11.7–15.4)
WBC: 5.1 10*3/uL (ref 3.4–10.8)

## 2023-01-28 LAB — FOLATE: Folate: 15.4 ng/mL (ref >3.0)

## 2023-01-28 LAB — LIPID PANEL W/O CHOL/HDL RATIO
Cholesterol, Total: 157 mg/dL (ref 100–199)
HDL: 62 mg/dL (ref >39)
LDL Chol Calc (NIH): 78 mg/dL (ref 0–99)
Triglycerides: 90 mg/dL (ref 0–149)
VLDL Cholesterol Cal: 17 mg/dL (ref 5–40)

## 2023-01-28 LAB — IRON AND TIBC
Iron Saturation: 35 % (ref 15–55)
Iron: 92 ug/dL (ref 27–159)
Total Iron Binding Capacity: 264 ug/dL (ref 250–450)
UIBC: 172 ug/dL (ref 131–425)

## 2023-01-28 LAB — VITAMIN D 25 HYDROXY (VIT D DEFICIENCY, FRACTURES): Vit D, 25-Hydroxy: 51.7 ng/mL (ref 30.0–100.0)

## 2023-01-28 LAB — VITAMIN B12: Vitamin B-12: 1350 pg/mL — ABNORMAL HIGH (ref 232–1245)

## 2023-01-28 LAB — FERRITIN: Ferritin: 108 ng/mL (ref 15–150)

## 2023-01-28 LAB — TSH: TSH: 1.03 u[IU]/mL (ref 0.450–4.500)

## 2023-01-28 LAB — HGB A1C W/O EAG: Hgb A1c MFr Bld: 4.9 % (ref 4.8–5.6)

## 2023-02-02 ENCOUNTER — Other Ambulatory Visit: Payer: Self-pay | Admitting: Family Medicine

## 2023-02-02 DIAGNOSIS — I10 Essential (primary) hypertension: Secondary | ICD-10-CM

## 2023-02-05 NOTE — Telephone Encounter (Signed)
error 

## 2023-02-09 ENCOUNTER — Encounter: Payer: Self-pay | Admitting: Family Medicine

## 2023-02-10 ENCOUNTER — Other Ambulatory Visit: Payer: Self-pay | Admitting: Family

## 2023-02-10 DIAGNOSIS — F411 Generalized anxiety disorder: Secondary | ICD-10-CM

## 2023-02-10 MED ORDER — BUPROPION HCL ER (XL) 300 MG PO TB24: 300.0000 mg | ORAL_TABLET | Freq: Every day | ORAL | 3 refills | Status: DC

## 2023-02-12 ENCOUNTER — Ambulatory Visit (INDEPENDENT_AMBULATORY_CARE_PROVIDER_SITE_OTHER): Payer: BC Managed Care – PPO | Admitting: Obstetrics

## 2023-02-12 ENCOUNTER — Encounter: Payer: Self-pay | Admitting: Obstetrics

## 2023-02-12 VITALS — BP 110/76 | HR 72 | Wt 213.3 lb

## 2023-02-12 DIAGNOSIS — Z3169 Encounter for other general counseling and advice on procreation: Secondary | ICD-10-CM | POA: Diagnosis not present

## 2023-02-12 NOTE — Progress Notes (Signed)
    Subjective  HPI: Lauren [name] is a 42 y.o. [id number] who presents today to discuss planning a pregnancy. She currently has a copper IUD in place but is considering having it removed. She is concerned because she has a history of 2 prior cesarean births. She had vertical skin incisions but does not know what type of incision she has on the uterus. She remembers being told after her birth that there was concern for her incision coming apart but she is not completely sure why (possibly infection). The concern was with the skin incision and not the uterine incision. Her second cesarean was a planned repeat at 39 weeks. Her medical records are not available in Epic although she birthed with [facility].  Past Medical History:  Diagnosis Date   Anxiety    Edema    ankle swelling   Past Surgical History:  Procedure Laterality Date   BARIATRIC SURGERY  02/2014   Gastric Sleeve   BASAL CELL CARCINOMA EXCISION  10/2015   CHOLECYSTECTOMY  02/2014   WISDOM TOOTH EXTRACTION     OB History     Gravida  2   Para  2   Term  2   Preterm      AB      Living  2      SAB      IAB      Ectopic      Multiple      Live Births  2          Allergies  Allergen Reactions   Prozac [Fluoxetine]     No help    Zoloft [Sertraline]     Zombie    ROS: negative  Objective  BP 110/76   Pulse 72   Wt 213 lb 4.8 oz (96.8 kg)   LMP 02/11/2023   BMI 39.01 kg/m   Physical examination Not medically indicated   Assessment  -Preconception discussion  Plan  -Jody will attempt obtain medical records. We discussed the increased risks of classical uterine incision and what that means for birth -Discussed declining fertility after 40 and increased pregnancy risks.  -Will schedule IUD removal and further preconception counseling if she decides to pursue pregnancy  [name], CNM

## 2023-02-16 NOTE — Telephone Encounter (Signed)
LVM for pt to call back to relay providers message regarding her message concerning her B12.

## 2023-02-19 NOTE — Telephone Encounter (Signed)
Spoke to Patient regarding Dr. Claris Che message regarding her fatigue and B12. Patient confirms the only kind of supplement she takes is a Flinstone Vitamin. Patient states she has not taken the Atarax in a long while and she still has the intermittent fatigue. Patient states she would like to think about how she would like to proceed due to having seen Hematology earlier this year for the same thing.

## 2023-02-23 ENCOUNTER — Telehealth: Payer: Self-pay

## 2023-02-23 NOTE — Telephone Encounter (Signed)
Pt calling to f/u from her appt; her old chart was going to be requested and MMS was to call pt back.  Has the chart been found?  [phone]  Pt aware we are still waiting on it.

## 2023-03-08 ENCOUNTER — Telehealth: Payer: Self-pay | Admitting: Family Medicine

## 2023-03-08 NOTE — Telephone Encounter (Signed)
TOC ok with me

## 2023-03-08 NOTE — Telephone Encounter (Signed)
Patient would like to transfer from Dr. Dana Allan to [facility]. She feel she would be a better fit.

## 2023-03-11 ENCOUNTER — Encounter (INDEPENDENT_AMBULATORY_CARE_PROVIDER_SITE_OTHER): Payer: Self-pay

## 2023-03-15 ENCOUNTER — Telehealth: Payer: Self-pay

## 2023-03-15 NOTE — Telephone Encounter (Signed)
Spoke with patient. Advised Lauren [name] will not be back in the office until Wed 03/17/23. Patient inquired if the information was scanned into her chart. Media reviewed. Advised patient previous c/section records are in her chart. Message send to [name] for review.

## 2023-03-15 NOTE — Telephone Encounter (Signed)
[facility] VOICEMAIL: Patient reports she left some paperwork/records for [name] to review regarding her prior c-section. She is requesting a return call to let her know what her thoughts are on this. [phone]

## 2023-03-17 NOTE — Telephone Encounter (Signed)
Spoke to [facility] regarding CS op notes she sent. Transverse incision done on uterus. Op note for RCS does not "extremely thin" lower uterine segment as well as macrosomia. Recommend repeat CS if she does have another pregnancy, but does not rule out TOLAC if desired.  Glenetta Borg, CNM

## 2023-04-19 ENCOUNTER — Ambulatory Visit: Payer: Medicaid Other | Admitting: Family Medicine

## 2023-04-26 ENCOUNTER — Ambulatory Visit: Payer: BC Managed Care – PPO | Admitting: Obstetrics

## 2023-04-26 ENCOUNTER — Encounter: Payer: Self-pay | Admitting: Obstetrics

## 2023-04-26 VITALS — BP 116/81 | HR 75 | Ht 62.0 in | Wt 209.0 lb

## 2023-04-26 DIAGNOSIS — Z30432 Encounter for removal of intrauterine contraceptive device: Secondary | ICD-10-CM

## 2023-04-26 MED ORDER — NORETHIN ACE-ETH ESTRAD-FE 1-20 MG-MCG PO TABS: 1.0000 | ORAL_TABLET | Freq: Every day | ORAL | 3 refills | Status: DC

## 2023-04-26 NOTE — Progress Notes (Signed)
   GYN ENCOUNTER  Subjective  HPI: Lauren [name] is a 42 y.o. [id number] who presents today for IUD removal. She desires a pregnancy. She reports that her last few periods have been more painful than in the past. We reviewed her pregnancy history and preconception counseling at a prior visit. She would like a prescription for birth control pills for now and plans to stop those when she is ready to conceive. She has no contraindications to COCs. She is already taking PNVs. She has an upcoming appointment with her PCP and will discuss changing her antihypertensive.  Past Medical History:  Diagnosis Date   Anxiety    Edema    ankle swelling   Past Surgical History:  Procedure Laterality Date   BARIATRIC SURGERY  02/2014   Gastric Sleeve   BASAL CELL CARCINOMA EXCISION  10/2015   CHOLECYSTECTOMY  02/2014   WISDOM TOOTH EXTRACTION     OB History     Gravida  2   Para  2   Term  2   Preterm      AB      Living  2      SAB      IAB      Ectopic      Multiple      Live Births  2          Allergies  Allergen Reactions   Prozac [Fluoxetine]     No help    Zoloft [Sertraline]     Zombie    HPI: Negative except as noted above  Objective  BP 116/81   Pulse 75   Ht 5\' 2"  (1.575 m)   Wt 209 lb (94.8 kg)   BMI 38.23 kg/m   Physical examination   Pelvic:   Vulva: Normal appearance.  No lesions.  Vagina: No lesions or abnormalities noted.  Support: Normal pelvic support.  Urethra No masses tenderness or scarring.  Meatus Normal size without lesions or prolapse.  Cervix: Normal appearance.  No lesions. IUD strings visible.  Anus: Normal exam.  No lesions.  Perineum: Normal exam.  No lesions.        Bimanual   Uterus: Normal size.  Non-tender.  Mobile.  AV.  Adnexae: No masses.  Non-tender to palpation.  Cul-de-sac: Negative for abnormality.    Procedure Note Consent was obtained prior to the procedure. The cervix was visualized with a speculum.  The IUD strings were grasped with a ring forceps and removed with gentle traction. No bleeding was noted. Post-procedure care was reviewed with Lorene Dy and she verbalized understanding.  Assessment  IUD removal Desires fertility  Plan  -Continue prenatal vitamin, folic acid, healthy lifestyle choices, tracking cycle -Rx for COCs sent to pharmacy on file -Return to care with positive pregnancy test or if pregnancy is not achieved after 6 cycles of timed intercourse  [name], CNM

## 2023-04-28 DIAGNOSIS — U071 COVID-19: Secondary | ICD-10-CM | POA: Diagnosis not present

## 2023-04-28 DIAGNOSIS — R059 Cough, unspecified: Secondary | ICD-10-CM | POA: Diagnosis not present

## 2023-05-06 DIAGNOSIS — R059 Cough, unspecified: Secondary | ICD-10-CM | POA: Diagnosis not present

## 2023-05-06 DIAGNOSIS — J069 Acute upper respiratory infection, unspecified: Secondary | ICD-10-CM | POA: Diagnosis not present

## 2023-05-20 ENCOUNTER — Ambulatory Visit: Payer: BC Managed Care – PPO | Admitting: Family

## 2023-05-20 ENCOUNTER — Encounter: Payer: Self-pay | Admitting: Family

## 2023-05-20 VITALS — BP 112/78 | HR 76 | Temp 97.8°F | Ht 62.0 in | Wt 210.2 lb

## 2023-05-20 DIAGNOSIS — R519 Headache, unspecified: Secondary | ICD-10-CM | POA: Diagnosis not present

## 2023-05-20 DIAGNOSIS — Z23 Encounter for immunization: Secondary | ICD-10-CM | POA: Diagnosis not present

## 2023-05-20 DIAGNOSIS — I10 Essential (primary) hypertension: Secondary | ICD-10-CM | POA: Diagnosis not present

## 2023-05-20 DIAGNOSIS — F321 Major depressive disorder, single episode, moderate: Secondary | ICD-10-CM | POA: Diagnosis not present

## 2023-05-20 MED ORDER — TRAZODONE HCL 50 MG PO TABS: 25.0000 mg | ORAL_TABLET | Freq: Every evening | ORAL | 3 refills | Status: DC | PRN

## 2023-05-20 NOTE — Progress Notes (Signed)
Assessment & Plan:  Nonintractable headache, unspecified chronicity pattern, unspecified headache type Assessment & Plan: Reassuring neurologic exam.  No headache today.  Discussed aggravating features including poor sleep quality, shiftwork sleep disorder. Prioritize sleep to address headaches and fatigue.  Trial of magnesium citrate, trazodone 25-50mg .   Patient is currently not on birth control  and considering pregnancy.  We discussed Benadryl to be used for nausea if needed.  Advised Tylenol. We reviewed [email or web address] in regards to safety of trazodone.   Orders: -     traZODone HCl; Take 0.5-1 tablets (25-50 mg total) by mouth at bedtime as needed for sleep.  Dispense: 90 tablet; Refill: 3  Need for influenza vaccination -     Flu vaccine trivalent PF, 6mos and older(Flulaval,Afluria,Fluarix,Fluzone)  Essential (primary) hypertension Assessment & Plan: Chronic, stable.  Continue HCTZ 12.5 mg daily.  Reviewed mother to [email or web address] as a relates to safety of hydrochlorothiazide.  HCTZ is used as second line in pregnancy.  We opted to continue for now after discussing benefit versus risk.   Depression, major, single episode, moderate (HCC) Assessment & Plan: Chronic, stable.  We opted to continue Wellbutrin 300mg  .  We discussed Wellbutrin and reviewed risk and benefit after reviewing available information on Uptodate and [email or web address].  Advised patient if she would become pregnant we would also consult GYN in regards to safety of continuation of all medications in pregnancy.  Patient verbalized understanding.       Return precautions given.   Risks, benefits, and alternatives of the medications and treatment plan prescribed today were discussed, and patient expressed understanding.   Education regarding symptom management and diagnosis given to patient on AVS either electronically or printed.  Return in about 6 weeks (around 07/01/2023).  Rennie Plowman, FNP  I have spent  40 minutes with a patient including precharting, exam, discussing medications, risk and benefit in potential pregnancy, reviewing medical records, and discussion plan of care.     Subjective:    Patient ID: Lauren [name], female    DOB: [birth year], 42 y.o.   MRN: [id number]  CC: [name] is a 42 y.o. female who presents today to establish care.    HPI: Complains of chronic HA .   No HA today.   Headaches started at 42 years of age.   Left sided 'top' of head. She has HA approx 1-2 per week however she doesnt have a HA every week.   Tylenol doesn't help. She will take motrin with relief. She limits nsaids d/t h/o gastric sleeve.   No aura  HA can be associated nausea, photophobia and phonophobia.   No vision loss, confusion, numbness of the face.   She thinks stress and poor sleep quality contribute HA. Trouble falling and staying asleep. Works overnight. She may get 4 hours sleep per day. Sleep is restorative.   Exercise is limited by fatigue.   She has stopped caffeine.   She has taken out her IUD; she is considering pregnancy      She follows with OB/GYN, [name], CNM  Allergies: Prozac [fluoxetine] and Zoloft [sertraline] Current Outpatient Medications on File Prior to Visit  Medication Sig Dispense Refill   buPROPion (WELLBUTRIN XL) 300 MG 24 hr tablet Take 1 tablet (300 mg total) by mouth daily. D/ 150 mg qd 90 tablet 3   cholecalciferol (VITAMIN D3) 25 MCG (1000 UNIT) tablet Take 1,000 Units by mouth daily.     hydrochlorothiazide (HYDRODIURIL) 12.5 MG tablet Take 1 tablet by  mouth once daily 90 tablet 3   hydrOXYzine (ATARAX) 10 MG tablet TAKE 1/2 (ONE-HALF) TABLET BY MOUTH AT BEDTIME AS NEEDED 90 tablet 1   Saccharomyces boulardii (PROBIOTIC) 250 MG CAPS Take by mouth.     No current facility-administered medications on file prior to visit.    Review of Systems  Constitutional:  Negative for chills and fever.  Eyes:  Negative for  visual disturbance.  Respiratory:  Negative for cough.   Cardiovascular:  Negative for chest pain and palpitations.  Gastrointestinal:  Negative for nausea and vomiting.  Neurological:  Positive for headaches.      Objective:    BP 112/78   Pulse 76   Temp 97.8 F (36.6 C) (Temporal)   Ht 5\' 2"  (1.575 m)   Wt 210 lb 4 oz (95.4 kg)   SpO2 99%   BMI 38.46 kg/m  BP Readings from Last 3 Encounters:  05/20/23 112/78  04/26/23 116/81  02/12/23 110/76   Wt Readings from Last 3 Encounters:  05/20/23 210 lb 4 oz (95.4 kg)  04/26/23 209 lb (94.8 kg)  02/12/23 213 lb 4.8 oz (96.8 kg)    Physical Exam Vitals reviewed.  Constitutional:      Appearance: She is well-developed.  HENT:     Mouth/Throat:     Pharynx: Uvula midline.  Eyes:     Conjunctiva/sclera: Conjunctivae normal.     Pupils: Pupils are equal, round, and reactive to light.     Comments: Fundus normal bilaterally.   Cardiovascular:     Rate and Rhythm: Normal rate and regular rhythm.     Pulses: Normal pulses.     Heart sounds: Normal heart sounds.  Pulmonary:     Effort: Pulmonary effort is normal.     Breath sounds: Normal breath sounds. No wheezing, rhonchi or rales.  Skin:    General: Skin is warm and dry.  Neurological:     Mental Status: She is alert.     Cranial Nerves: No cranial nerve deficit.     Sensory: No sensory deficit.     Deep Tendon Reflexes:     Reflex Scores:      Bicep reflexes are 2+ on the right side and 2+ on the left side.      Patellar reflexes are 2+ on the right side and 2+ on the left side.    Comments: Grip equal and strong bilateral upper extremities. Gait strong and steady. Able to perform rapid alternating movement without difficulty.   Psychiatric:        Speech: Speech normal.        Behavior: Behavior normal.        Thought Content: Thought content normal.

## 2023-05-20 NOTE — Patient Instructions (Addendum)
Please call  and schedule your 3D mammogram and /or bone density scan as we discussed.   [facility]  ( new location in 2023)  [address], [place], [place] [address]  [place], [place]  [phone]    I would advise not to use atarax in pregnancy.   You may continue hydrochlorothiazide and wellbutrin however please discuss with OB team if you were to become pregnant . We want to be very cautious here.  Start magnesium citrate 400mg  daily for headache prevention.   Trial of trazodone for sleep during the day.

## 2023-05-20 NOTE — Assessment & Plan Note (Addendum)
Chronic, stable.  Continue HCTZ 12.5 mg daily.  Reviewed mother [email or web address] as a relates to safety of hydrochlorothiazide.  HCTZ is used as second line in pregnancy.  We opted to continue for now after discussing benefit versus risk.

## 2023-05-20 NOTE — Assessment & Plan Note (Addendum)
Reassuring neurologic exam.  No headache today.  Discussed aggravating features including poor sleep quality, shiftwork sleep disorder. Prioritize sleep to address headaches and fatigue.  Trial of magnesium citrate, trazodone 25-50mg .   Patient is currently not on birth control  and considering pregnancy.  We discussed Benadryl to be used for nausea if needed.  Advised Tylenol. We reviewed [email or web address] in regards to safety of trazodone.

## 2023-05-24 ENCOUNTER — Telehealth: Payer: Self-pay

## 2023-05-24 NOTE — Telephone Encounter (Signed)
Pt calling; has had positive preg test; has appt next week; is on hydrochlorothiazide; PCP is unsure if pt should continue on it while preg; please ask Missy.  She is not going to take it until she hears back from [place].  [phone]

## 2023-05-25 NOTE — Assessment & Plan Note (Addendum)
Chronic, stable.  We opted to continue Wellbutrin 300mg  .  We discussed Wellbutrin and reviewed risk and benefit after reviewing available information on Uptodate and [email or web address].  Advised patient if she would become pregnant we would also consult GYN in regards to safety of continuation of all medications in pregnancy.  Patient verbalized understanding.

## 2023-05-25 NOTE — Telephone Encounter (Signed)
Pt calling today to see if it is okay for her to take magnesium 400mg  and trazadone 25-50mg  at bedtime.  [phone]

## 2023-05-26 ENCOUNTER — Encounter: Payer: Self-pay | Admitting: Family

## 2023-05-26 ENCOUNTER — Encounter: Payer: Self-pay | Admitting: Licensed Practical Nurse

## 2023-05-27 ENCOUNTER — Telehealth: Payer: Self-pay | Admitting: Family

## 2023-05-27 NOTE — Telephone Encounter (Signed)
Taken care on separate encounter

## 2023-05-27 NOTE — Telephone Encounter (Signed)
Spoke with pt She is pregnant  Cough is sporadic , and improved She denies chest pain, fever, leg swelling, palpitations.  She complains of central chest 'discomfort' x 3 weeks, unchanged. Describes as feeling 'weird'. She doesn't feel symptom right now. She can press on area without pain.   She reports heart burn the other day.  She hasn't lifting anything heavy.   No pattern. No pain with coughing.  No pain after she eats.   No pain with coughing.   She had covid 4 weeks ago.   She had CXR at urgent care. 05/06/23   No smoking ,drug use.   Plan Explained to patient over the phone, etiology is unclear.  Symptom correlates with COVID 4 weeks ago.   No apparent red flag symptoms at this time. She is agreeable to monitoring and doesn't want to pursue CXR at this time in setting of pregnancy.  She is seeing her OB/GYN office tomorrow and states she will also mention this to them. Advised shortness of breath, chest pain would warrant emergency room evaluation to ensure that she is safe.  Patient will let me know how she is doing

## 2023-05-28 ENCOUNTER — Ambulatory Visit (INDEPENDENT_AMBULATORY_CARE_PROVIDER_SITE_OTHER): Payer: BC Managed Care – PPO

## 2023-05-28 VITALS — BP 123/66 | HR 82 | Resp 16 | Ht 62.0 in | Wt 211.7 lb

## 2023-05-28 DIAGNOSIS — Z3201 Encounter for pregnancy test, result positive: Secondary | ICD-10-CM

## 2023-05-28 DIAGNOSIS — Z3481 Encounter for supervision of other normal pregnancy, first trimester: Secondary | ICD-10-CM

## 2023-05-28 DIAGNOSIS — N912 Amenorrhea, unspecified: Secondary | ICD-10-CM

## 2023-05-28 DIAGNOSIS — Z3A01 Less than 8 weeks gestation of pregnancy: Secondary | ICD-10-CM | POA: Diagnosis not present

## 2023-05-28 LAB — POCT URINE PREGNANCY: Preg Test, Ur: POSITIVE — AB

## 2023-05-28 NOTE — Patient Instructions (Signed)
First Trimester of Pregnancy  The first trimester of pregnancy starts on the first day of your last menstrual period until the end of week 12. This is also called months 1 through 3 of pregnancy. Body changes during your first trimester Your body goes through many changes during pregnancy. The changes usually return to normal after your baby is born. Physical changes You may gain or lose weight. Your breasts may grow larger and hurt. The area around your nipples may get darker. Dark spots or blotches may develop on your face. You may have changes in your hair. Health changes You may feel like you might vomit (nauseous), and you may vomit. You may have heartburn. You may have headaches. You may have trouble pooping (constipation). Your gums may bleed. Other changes You may get tired easily. You may pee (urinate) more often. Your menstrual periods will stop. You may not feel hungry. You may want to eat certain kinds of food. You may have changes in your emotions from day to day. You may have more dreams. Follow these instructions at home: Medicines Take over-the-counter and prescription medicines only as told by your doctor. Some medicines are not safe during pregnancy. Take a prenatal vitamin that contains at least 600 micrograms (mcg) of folic acid. Eating and drinking Eat healthy meals that include: Fresh fruits and vegetables. Whole grains. Good sources of protein, such as meat, eggs, or tofu. Low-fat dairy products. Avoid raw meat and unpasteurized juice, milk, and cheese. If you feel like you may vomit, or you vomit: Eat 4 or 5 small meals a day instead of 3 large meals. Try eating a few soda crackers. Drink liquids between meals instead of during meals. You may need to take these actions to prevent or treat trouble pooping: Drink enough fluids to keep your pee (urine) pale yellow. Eat foods that are high in fiber. These include beans, whole grains, and fresh fruits and  vegetables. Limit foods that are high in fat and sugar. These include fried or sweet foods. Activity Exercise only as told by your doctor. Most people can do their usual exercise routine during pregnancy. Stop exercising if you have cramps or pain in your lower belly (abdomen) or low back. Do not exercise if it is too hot or too humid, or if you are in a place of great height (high altitude). Avoid heavy lifting. If you choose to, you may have sex unless your doctor tells you not to. Relieving pain and discomfort Wear a good support bra if your breasts are sore. Rest with your legs raised (elevated) if you have leg cramps or low back pain. If you have bulging veins (varicose veins) in your legs: Wear support hose as told by your doctor. Raise your feet for 15 minutes, 3-4 times a day. Limit salt in your food. Safety Wear your seat belt at all times when you are in a car. Talk with your doctor if someone is hurting you or yelling at you. Talk with your doctor if you are feeling sad or have thoughts of hurting yourself. Lifestyle Do not use hot tubs, steam rooms, or saunas. Do not douche. Do not use tampons or scented sanitary pads. Do not use herbal medicines, illegal drugs, or medicines that are not approved by your doctor. Do not drink alcohol. Do not smoke or use any products that contain nicotine or tobacco. If you need help quitting, ask your doctor. Avoid cat litter boxes and soil that is used by cats. These carry  germs that can cause harm to the baby and can cause a loss of your baby by miscarriage or stillbirth. General instructions Keep all follow-up visits. This is important. Ask for help if you need counseling or if you need help with nutrition. Your doctor can give you advice or tell you where to go for help. Visit your dentist. At home, brush your teeth with a soft toothbrush. Floss gently. Write down your questions. Take them to your prenatal visits. Where to find more  information American Pregnancy Association: americanpregnancy.org [employer]: www.acog.org Office on Women's Health: [employer] Contact a doctor if: You are dizzy. You have a fever. You have mild cramps or pressure in your lower belly. You have a nagging pain in your belly area. You continue to feel like you may vomit, you vomit, or you have watery poop (diarrhea) for 24 hours or longer. You have a bad-smelling fluid coming from your vagina. You have pain when you pee. You are exposed to a disease that spreads from person to person, such as chickenpox, measles, Zika virus, HIV, or hepatitis. Get help right away if: You have spotting or bleeding from your vagina. You have very bad belly cramping or pain. You have shortness of breath or chest pain. You have any kind of injury, such as from a fall or a car crash. You have new or increased pain, swelling, or redness in an arm or leg. Summary The first trimester of pregnancy starts on the first day of your last menstrual period until the end of week 12 (months 1 through 3). Eat 4 or 5 small meals a day instead of 3 large meals. Do not smoke or use any products that contain nicotine or tobacco. If you need help quitting, ask your doctor. Keep all follow-up visits. This information is not intended to replace advice given to you by your health care provider. Make sure you discuss any questions you have with your health care provider. Document Revised: 12/20/2019 Document Reviewed: 10/26/2019 Elsevier Patient Education  2024 [employer] Morning Sickness  Morning sickness is when you feel like you may vomit (feel nauseous) during pregnancy. Sometimes, you may vomit. Morning sickness most often happens in the morning, but it can also happen at any time of the day. Some women may have morning sickness that makes them vomit all the time. This is a more serious problem that needs  treatment. What are the causes? The cause of this condition is not known. What increases the risk? You had vomiting or a feeling like you may vomit before your pregnancy. You had morning sickness in another pregnancy. You are pregnant with more than one baby, such as twins. What are the signs or symptoms? Feeling like you may vomit. Vomiting. How is this treated? Treatment is usually not needed for this condition. You may only need to change what you eat. In some cases, your doctor may give you some things to take for your condition. These include: Vitamin B6 supplements. Medicines to treat the feeling that you may vomit. Ginger. Follow these instructions at home: Medicines Take over-the-counter and prescription medicines only as told by your doctor. Do not take any medicines until you talk with your doctor about them first. Take multivitamins before you get pregnant. These can stop or lessen the symptoms of morning sickness. Eating and drinking Eat dry toast or crackers before getting out of bed. Eat 5 or 6 small meals a day. Eat dry and bland foods like  rice and baked potatoes. Do not eat greasy, fatty, or spicy foods. Have someone cook for you if the smell of food causes you to vomit or to feel like you may vomit. If you feel like you may vomit after taking prenatal vitamins, take them at night or with a snack. Eat protein foods when you need a snack. Nuts, yogurt, and cheese are good choices. Drink fluids throughout the day. Try ginger ale made with real ginger, ginger tea made from fresh grated ginger, or ginger candies. General instructions Do not smoke or use any products that contain nicotine or tobacco. If you need help quitting, ask your doctor. Use an air purifier to keep the air in your house free of smells. Get lots of fresh air. Try to avoid smells that make you feel sick. Try wearing an acupressure wristband. This is a wristband that is used to treat seasickness. Try  a treatment called acupuncture. In this treatment, a doctor puts needles into certain areas of your body to make you feel better. Contact a doctor if: You need medicine to feel better. You feel dizzy or light-headed. You are losing weight. Get help right away if: The feeling that you may vomit will not go away, or you cannot stop vomiting. You faint. You have very bad pain in your belly. Summary Morning sickness is when you feel like you may vomit (feel nauseous) during pregnancy. You may feel sick in the morning, but you can feel this way at any time of the day. Making some changes to what you eat may help your symptoms go away. This information is not intended to replace advice given to you by your health care provider. Make sure you discuss any questions you have with your health care provider. Document Revised: 02/26/2020 Document Reviewed: 02/05/2020 Elsevier Patient Education  2024 [employer]. First Trimester of Pregnancy  The first trimester of pregnancy starts on the first day of your last menstrual period until the end of week 12. This is also called months 1 through 3 of pregnancy. Body changes during your first trimester Your body goes through many changes during pregnancy. The changes usually return to normal after your baby is born. Physical changes You may gain or lose weight. Your breasts may grow larger and hurt. The area around your nipples may get darker. Dark spots or blotches may develop on your face. You may have changes in your hair. Health changes You may feel like you might vomit (nauseous), and you may vomit. You may have heartburn. You may have headaches. You may have trouble pooping (constipation). Your gums may bleed. Other changes You may get tired easily. You may pee (urinate) more often. Your menstrual periods will stop. You may not feel hungry. You may want to eat certain kinds of food. You may have changes in your emotions from day to  day. You may have more dreams. Follow these instructions at home: Medicines Take over-the-counter and prescription medicines only as told by your doctor. Some medicines are not safe during pregnancy. Take a prenatal vitamin that contains at least 600 micrograms (mcg) of folic acid. Eating and drinking Eat healthy meals that include: Fresh fruits and vegetables. Whole grains. Good sources of protein, such as meat, eggs, or tofu. Low-fat dairy products. Avoid raw meat and unpasteurized juice, milk, and cheese. If you feel like you may vomit, or you vomit: Eat 4 or 5 small meals a day instead of 3 large meals. Try eating a few soda crackers.  Drink liquids between meals instead of during meals. You may need to take these actions to prevent or treat trouble pooping: Drink enough fluids to keep your pee (urine) pale yellow. Eat foods that are high in fiber. These include beans, whole grains, and fresh fruits and vegetables. Limit foods that are high in fat and sugar. These include fried or sweet foods. Activity Exercise only as told by your doctor. Most people can do their usual exercise routine during pregnancy. Stop exercising if you have cramps or pain in your lower belly (abdomen) or low back. Do not exercise if it is too hot or too humid, or if you are in a place of great height (high altitude). Avoid heavy lifting. If you choose to, you may have sex unless your doctor tells you not to. Relieving pain and discomfort Wear a good support bra if your breasts are sore. Rest with your legs raised (elevated) if you have leg cramps or low back pain. If you have bulging veins (varicose veins) in your legs: Wear support hose as told by your doctor. Raise your feet for 15 minutes, 3-4 times a day. Limit salt in your food. Safety Wear your seat belt at all times when you are in a car. Talk with your doctor if someone is hurting you or yelling at you. Talk with your doctor if you are feeling  sad or have thoughts of hurting yourself. Lifestyle Do not use hot tubs, steam rooms, or saunas. Do not douche. Do not use tampons or scented sanitary pads. Do not use herbal medicines, illegal drugs, or medicines that are not approved by your doctor. Do not drink alcohol. Do not smoke or use any products that contain nicotine or tobacco. If you need help quitting, ask your doctor. Avoid cat litter boxes and soil that is used by cats. These carry germs that can cause harm to the baby and can cause a loss of your baby by miscarriage or stillbirth. General instructions Keep all follow-up visits. This is important. Ask for help if you need counseling or if you need help with nutrition. Your doctor can give you advice or tell you where to go for help. Visit your dentist. At home, brush your teeth with a soft toothbrush. Floss gently. Write down your questions. Take them to your prenatal visits. Where to find more information American Pregnancy Association: americanpregnancy.org [employer]: www.acog.org Office on Women's Health: [employer] Contact a doctor if: You are dizzy. You have a fever. You have mild cramps or pressure in your lower belly. You have a nagging pain in your belly area. You continue to feel like you may vomit, you vomit, or you have watery poop (diarrhea) for 24 hours or longer. You have a bad-smelling fluid coming from your vagina. You have pain when you pee. You are exposed to a disease that spreads from person to person, such as chickenpox, measles, Zika virus, HIV, or hepatitis. Get help right away if: You have spotting or bleeding from your vagina. You have very bad belly cramping or pain. You have shortness of breath or chest pain. You have any kind of injury, such as from a fall or a car crash. You have new or increased pain, swelling, or redness in an arm or leg. Summary The first trimester of pregnancy  starts on the first day of your last menstrual period until the end of week 12 (months 1 through 3). Eat 4 or 5 small meals a day instead  of 3 large meals. Do not smoke or use any products that contain nicotine or tobacco. If you need help quitting, ask your doctor. Keep all follow-up visits. This information is not intended to replace advice given to you by your health care provider. Make sure you discuss any questions you have with your health care provider. Document Revised: 12/20/2019 Document Reviewed: 10/26/2019 Elsevier Patient Education  2024 Elsevier Inc. Commonly Asked Questions During Pregnancy  Cats: A parasite can be excreted in cat feces.  To avoid exposure you need to have another person empty the little box.  If you must empty the litter box you will need to wear gloves.  Wash your hands after handling your cat.  This parasite can also be found in raw or undercooked meat so this should also be avoided.  Colds, Sore Throats, Flu: Please check your medication sheet to see what you can take for symptoms.  If your symptoms are unrelieved by these medications please call the office.  Dental Work: Most any dental work Agricultural consultant recommends is permitted.  X-rays should only be taken during the first trimester if absolutely necessary.  Your abdomen should be shielded with a lead apron during all x-rays.  Please notify your provider prior to receiving any x-rays.  Novocaine is fine; gas is not recommended.  If your dentist requires a note from [place] prior to dental work please call the office and we will provide one for you.  Exercise: Exercise is an important part of staying healthy during your pregnancy.  You may continue most exercises you were accustomed to prior to pregnancy.  Later in your pregnancy you will most likely notice you have difficulty with activities requiring balance like riding a bicycle.  It is important that you listen to your body and avoid activities that put you at a  higher risk of falling.  Adequate rest and staying well hydrated are a must!  If you have questions about the safety of specific activities ask your provider.    Exposure to Children with illness: Try to avoid obvious exposure; report any symptoms to [place] when noted,  If you have chicken pos, red measles or mumps, you should be immune to these diseases.   Please do not take any vaccines while pregnant unless you have checked with your OB provider.  Fetal Movement: After 28 weeks we recommend you do "kick counts" twice daily.  Lie or sit down in a calm quiet environment and count your baby movements "kicks".  You should feel your baby at least 10 times per hour.  If you have not felt 10 kicks within the first hour get up, walk around and have something sweet to eat or drink then repeat for an additional hour.  If count remains less than 10 per hour notify your provider.  Fumigating: Follow your pest control agent's advice as to how long to stay out of your home.  Ventilate the area well before re-entering.  Hemorrhoids:   Most over-the-counter preparations can be used during pregnancy.  Check your medication to see what is safe to use.  It is important to use a stool softener or fiber in your diet and to drink lots of liquids.  If hemorrhoids seem to be getting worse please call the office.   Hot Tubs:  Hot tubs Jacuzzis and saunas are not recommended while pregnant.  These increase your internal body temperature and should be avoided.  Intercourse:  Sexual intercourse is safe during pregnancy as long  as you are comfortable, unless otherwise advised by your provider.  Spotting may occur after intercourse; report any bright red bleeding that is heavier than spotting.  Labor:  If you know that you are in labor, please go to the hospital.  If you are unsure, please call the office and let us help you decide what to do.  Lifting, straining, etc:  If your job requires heavy lifting or straining please check  with your provider for any limitations.  Generally, you should not lift items heavier than that you can lift simply with your hands and arms (no back muscles)  Painting:  Paint fumes do not harm your pregnancy, but may make you ill and should be avoided if possible.  Latex or water based paints have less odor than oils.  Use adequate ventilation while painting.  Permanents & Hair Color:  Chemicals in hair dyes are not recommended as they cause increase hair dryness which can increase hair loss during pregnancy.  " Highlighting" and permanents are allowed.  Dye may be absorbed differently and permanents may not hold as well during pregnancy.  Sunbathing:  Use a sunscreen, as skin burns easily during pregnancy.  Drink plenty of fluids; avoid over heating.  Tanning Beds:  Because their possible side effects are still unknown, tanning beds are not recommended.  Ultrasound Scans:  Routine ultrasounds are performed at approximately 20 weeks.  You will be able to see your baby's general anatomy an if you would like to know the gender this can usually be determined as well.  If it is questionable when you conceived you may also receive an ultrasound early in your pregnancy for dating purposes.  Otherwise ultrasound exams are not routinely performed unless there is a medical necessity.  Although you can request a scan we ask that you pay for it when conducted because insurance does not cover " patient request" scans.  Work: If your pregnancy proceeds without complications you may work until your due date, unless your physician or employer advises otherwise.  Round Ligament Pain/Pelvic Discomfort:  Sharp, shooting pains not associated with bleeding are fairly common, usually occurring in the second trimester of pregnancy.  They tend to be worse when standing up or when you remain standing for long periods of time.  These are the result of pressure of certain pelvic ligaments called "round ligaments".  Rest,  Tylenol and heat seem to be the most effective relief.  As the womb and fetus grow, they rise out of the pelvis and the discomfort improves.  Please notify the office if your pain seems different than that described.  It may represent a more serious condition.   Common Medications Safe in Pregnancy  Acne:      Constipation:  Benzoyl Peroxide     Colace  Clindamycin      Dulcolax Suppository  Topica Erythromycin     Fibercon  Salicylic Acid      Metamucil         Miralax AVOID:        Senakot   Accutane    Cough:  Retin-A       Cough Drops  Tetracycline      Phenergan w/ Codeine if Rx  Minocycline      Robitussin (Plain & DM)  Antibiotics:     Crabs/Lice:  Ceclor       RID  Cephalosporins    AVOID:  E-Mycins      Kwell  Keflex  Macrobid/Macrodantin   Diarrhea:  Penicillin      Kao-Pectate  Zithromax      Imodium AD         PUSH FLUIDS AVOID:       Cipro     Fever:  Tetracycline      Tylenol (Regular or Extra  Minocycline       Strength)  Levaquin      Extra Strength-Do not          Exceed 8 tabs/24 hrs Caffeine:        200mg /day (equiv. To 1 cup of coffee or  approx. 3 12 oz sodas)         Gas: Cold/Hayfever:       Gas-X  Benadryl      Mylicon  Claritin       Phazyme  **Claritin-D        Chlor-Trimeton    Headaches:  Dimetapp      ASA-Free Excedrin  Drixoral-Non-Drowsy     Cold Compress  Mucinex (Guaifenasin)     Tylenol (Regular or Extra  Sudafed/Sudafed-12 Hour     Strength)  **Sudafed PE Pseudoephedrine   Tylenol Cold & Sinus     Vicks Vapor Rub  Zyrtec  **AVOID if Problems With Blood Pressure         Heartburn: Avoid lying down for at least 1 hour after meals  Aciphex      Maalox     Rash:  Milk of Magnesia     Benadryl    Mylanta       1% Hydrocortisone Cream  Pepcid  Pepcid Complete   Sleep Aids:  Prevacid      Ambien   Prilosec       Benadryl  Rolaids       Chamomile Tea  Tums (Limit 4/day)     Unisom         Tylenol PM         Warm  milk-add vanilla or  Hemorrhoids:       Sugar for taste  Anusol/Anusol H.C.  (RX: Analapram 2.5%)  Sugar Substitutes:  Hydrocortisone OTC     Ok in moderation  Preparation H      Tucks        Vaseline lotion applied to tissue with wiping    Herpes:     Throat:  Acyclovir      Oragel  Famvir  Valtrex     Vaccines:         Flu Shot Leg Cramps:       *Gardasil  Benadryl      Hepatitis A         Hepatitis B Nasal Spray:       Pneumovax  Saline Nasal Spray     Polio Booster         Tetanus Nausea:       Tuberculosis test or PPD  Vitamin B6 25 mg TID   AVOID:    Dramamine      *Gardasil  Emetrol       Live Poliovirus  Ginger Root 250 mg QID    MMR (measles, mumps &  High Complex Carbs @ Bedtime    rebella)  Sea Bands-Accupressure    Varicella (Chickenpox)  Unisom 1/2 tab TID     *No known complications           If received before Pain:         Known pregnancy;   Darvocet       Resume  series after  Lortab        Delivery  Percocet    Yeast:   Tramadol      Femstat  Tylenol 3      Gyne-lotrimin  Ultram       Monistat  Vicodin           MISC:         All Sunscreens           Hair Coloring/highlights          Insect Repellant's          (Including DEET)         Mystic Tans

## 2023-05-28 NOTE — Progress Notes (Signed)
    NURSE VISIT NOTE  Subjective:    Patient ID: Lauren [name], female    DOB: 06/26/1981, 42 y.o.   MRN: [id number]  HPI  Patient is a 42 y.o. [id number] female who presents for evaluation of amenorrhea. She believes she could be pregnant. Pregnancy is desired. Sexual Activity: single partner, contraception: IUD. Current symptoms also include: frequent urination and positive home pregnancy test. Last period was normal. She has questions about her hydrochlorothiazide. She was told not to and then another provider told her it was ok to continue taking the medication, please clarify through mychart.    Objective:    BP 123/66   Pulse 82   Resp 16   Ht 5\' 2"  (1.575 m)   Wt 211 lb 11.2 oz (96 kg)   LMP 04/28/2023   BMI 38.72 kg/m   Lab Review  Results for orders placed or performed in visit on 05/28/23  POCT urine pregnancy  Result Value Ref Range   Preg Test, Ur Positive (A) Negative    Assessment:   1. Amenorrhea     Plan:   Pregnancy Test: Positive  Estimated Date of Delivery: None noted. BP Cuff Measurement taken. Cuff Size Adult Large Encouraged well-balanced diet, plenty of rest when needed, pre-natal vitamins daily and walking for exercise.  Discussed self-help for nausea, avoiding OTC medications until consulting provider or pharmacist, other than Tylenol as needed, minimal caffeine (1-2 cups daily) and avoiding alcohol.   She will schedule her nurse visit @ 7-[redacted] wks pregnant, u/s for dating @10  wk, and NOB visit at [redacted] wk pregnant.    Feel free to call with any questions.       Santiago Bumpers, CMA [facility]

## 2023-06-23 ENCOUNTER — Telehealth: Payer: BC Managed Care – PPO

## 2023-06-23 ENCOUNTER — Telehealth: Payer: Self-pay

## 2023-06-23 DIAGNOSIS — O0990 Supervision of high risk pregnancy, unspecified, unspecified trimester: Secondary | ICD-10-CM | POA: Insufficient documentation

## 2023-06-23 DIAGNOSIS — Z3480 Encounter for supervision of other normal pregnancy, unspecified trimester: Secondary | ICD-10-CM | POA: Insufficient documentation

## 2023-06-23 DIAGNOSIS — Z3689 Encounter for other specified antenatal screening: Secondary | ICD-10-CM

## 2023-06-23 HISTORY — DX: Supervision of high risk pregnancy, unspecified, unspecified trimester: O09.90

## 2023-06-23 NOTE — Telephone Encounter (Signed)
Pt aware.

## 2023-06-23 NOTE — Progress Notes (Signed)
New OB Intake  I connected with  Cala Bradford on 06/23/23 at  9:15 AM EST by Video Visit and verified that I am speaking with the correct person using two identifiers. Nurse is located at [facility] and pt is located at home.  I explained I am completing New OB Intake today. We discussed her EDD of 02/02/2024 that is based on LMP of 04/28/2023. Pt is G4/P2012. I reviewed her allergies, medications, Medical/Surgical/OB history, and appropriate screenings. There are cats in the home. No litter box. Indoor, Outdoor Based on history, this is a/an pregnancy uncomplicated . Her obstetrical history is significant for obesity, high blood pressure, advanced maternal age.  Patient Active Problem List   Diagnosis Date Noted   Supervision of other normal pregnancy, antepartum 06/23/2023   Headache 05/20/2023   Mood disorder (HCC) 10/20/2022   Obesity, Class II, BMI 35-39.9 10/20/2022   Breast cancer screening by mammogram 10/20/2022   Weight gain 09/03/2022   Obsessive-compulsive disorder 01/23/2022   Anxiety 01/23/2022   Central auditory processing disorder (CAPD) 01/23/2022   Genetic testing 12/16/2021   Acute cough 09/02/2021   B12 deficiency 09/02/2021   Non-recurrent acute serous otitis media of right ear 09/02/2021   Post viral syndrome 09/02/2021   Depression, major, single episode, moderate (HCC) 09/02/2021   Sleep apnea 09/02/2021   History of URI (upper respiratory infection) 08/21/2021   History of difficulty in concentrating 08/21/2021   Intertriginous candidiasis 01/07/2021   Overweight 08/21/2020   Abdominal bloating 12/29/2019   Body mass index (BMI) of 36.0-36.9 in adult 10/05/2019   Other fatigue 10/05/2019   Allergic contact dermatitis 10/05/2019   Encounter for routine adult health examination without abnormal findings 10/05/2019   Generalized anxiety disorder 12/13/2018   Vitamin D deficiency 05/09/2018   History of gastric bypass 05/09/2018   Environmental and  seasonal allergies 05/01/2016   Nausea 09/10/2015   Essential (primary) hypertension 12/07/2014   Headache, migraine 12/07/2014   Neoplasm of uncertain behavior of skin 12/07/2014   Addison anemia 12/07/2014    Concerns addressed today Pt doesn't know whether to continue or stop hydrochlorothiazide; msg sent to [facility] for clarification.   Delivery Plans:  Plans to deliver at [facility].  Anatomy US Explained first scheduled [place] will be Dec. 4th and an anatomy scan will be done at 20 weeks which was not mentioned to pt.  Labs Discussed genetic screening with patient. Patient desires genetic testing to be drawn at new OB visit. Discussed possible labs to be drawn at new OB appointment.  COVID Vaccine Patient has had COVID vaccine.   Social Determinants of Health Food Insecurity: denies food insecurity Transportation: Patient denies transportation needs.  First visit review I reviewed new OB appt with pt. I explained she will have ob bloodwork and pap smear/pelvic exam if indicated. Explained pt will be seen by an AOB Provider at first visit; encounter routed to appropriate provider.   Loran Senters, [facility] 06/23/2023  9:50 AM

## 2023-06-23 NOTE — Telephone Encounter (Signed)
She is on a very small dose so can likely hold this medication at this time. I would advise her to check her BPs occasionally, and if significant elevations noted >140/90, should follow up with [place] for a medication visit prior to her NOB appointment.

## 2023-06-23 NOTE — Telephone Encounter (Signed)
Pt needs clarification on whether to stop her hydrochlorothiazide or to continue taking it; is 8wks today.

## 2023-06-23 NOTE — Patient Instructions (Signed)
Morning Sickness Morning sickness is when you throw up or feel like you may throw up during pregnancy. This condition often occurs in the morning, but it can also occur at any time of day. Morning sickness is most common during the first three months of pregnancy, but it can go on throughout the pregnancy. Morning sickness is usually harmless. But if you throw up all the time, you should see your health care provider. You may also hear this condition called nausea and vomiting of pregnancy. What are the causes? The cause of morning sickness is not known. It may be linked to changes in hormones during pregnancy. What increases the risk? You're more likely to have morning sickness if: You had morning sickness in another pregnancy. You're pregnant with more than one baby, such as twins. You had morning sickness in other pregnancies. You have had motion sickness before you were pregnant. You have had bad headaches or migraines before you were pregnant. What are the signs or symptoms? Symptoms of morning sickness include: Feeling like you may throw up. Throwing up. How is this diagnosed? Morning sickness is diagnosed based on your symptoms. How is this treated? Treatment is usually not needed for morning sickness. You may only need to change what you eat. In some cases, your provider may give you: Vitamin B6 supplements. Medicines to prevent throwing up. Ginger. Follow these instructions at home: Medicines Take your medicines only as told by your provider. Do not use any prescription, over-the-counter, or herbal medicines for morning sickness without first talking with your provider. Take prenatal vitamins. These can stop or lessen the symptoms of morning sickness. If you feel like you may throw up after taking prenatal vitamins, take them at night or with a snack. Eating and drinking     Eat dry toast or crackers before getting out of bed. Eat 5 or 6 small meals a day. Try ginger ale  made with real ginger, ginger tea, or ginger candies. Drink fluids throughout the day. Eat protein foods when you need a snack. Nuts, yogurt, and cheese are good choices. Eat dry and bland foods like rice or baked potatoes. Foods that are high in carbohydrates are often helpful. Have someone cook for you if the smell of food makes you want to throw up. Foods to avoid Greasy foods. Fatty foods. Spicy foods. General instructions Try to avoid smells that make you feel sick. Use an air purifier to keep the air in your house free of smells. Try using an acupressure wristband. This is a wristband that's used to treat motion sickness. Try acupuncture. In this treatment, a provider puts thin needles into certain areas of your body to make you feel better. Brush your teeth after throwing up or rinse with a mix of baking soda and water. The acid in throw-up can hurt your teeth. Contact a health care provider if: Your symptoms do not get better. You feel dizzy or light-headed. You're losing weight. Get help right away if: The feeling that you may throw up will not go away, or you can't stop throwing up. You faint. You have very bad pain in your belly. This information is not intended to replace advice given to you by your health care provider. Make sure you discuss any questions you have with your health care provider. Document Revised: 10/22/2022 Document Reviewed: 10/22/2022 Elsevier Patient Education  2024 [employer]. First Trimester of Pregnancy  The first trimester of pregnancy starts on the first day of your last menstrual  period until the end of week 12. This is also called months 1 through 3 of pregnancy. Body changes during your first trimester Your body goes through many changes during pregnancy. The changes usually return to normal after your baby is born. Physical changes You may gain or lose weight. Your breasts may grow larger and hurt. The area around your nipples may get  darker. Dark spots or blotches may develop on your face. You may have changes in your hair. Health changes You may feel like you might vomit (nauseous), and you may vomit. You may have heartburn. You may have headaches. You may have trouble pooping (constipation). Your gums may bleed. Other changes You may get tired easily. You may pee (urinate) more often. Your menstrual periods will stop. You may not feel hungry. You may want to eat certain kinds of food. You may have changes in your emotions from day to day. You may have more dreams. Follow these instructions at home: Medicines Take over-the-counter and prescription medicines only as told by your doctor. Some medicines are not safe during pregnancy. Take a prenatal vitamin that contains at least 600 micrograms (mcg) of folic acid. Eating and drinking Eat healthy meals that include: Fresh fruits and vegetables. Whole grains. Good sources of protein, such as meat, eggs, or tofu. Low-fat dairy products. Avoid raw meat and unpasteurized juice, milk, and cheese. If you feel like you may vomit, or you vomit: Eat 4 or 5 small meals a day instead of 3 large meals. Try eating a few soda crackers. Drink liquids between meals instead of during meals. You may need to take these actions to prevent or treat trouble pooping: Drink enough fluids to keep your pee (urine) pale yellow. Eat foods that are high in fiber. These include beans, whole grains, and fresh fruits and vegetables. Limit foods that are high in fat and sugar. These include fried or sweet foods. Activity Exercise only as told by your doctor. Most people can do their usual exercise routine during pregnancy. Stop exercising if you have cramps or pain in your lower belly (abdomen) or low back. Do not exercise if it is too hot or too humid, or if you are in a place of great height (high altitude). Avoid heavy lifting. If you choose to, you may have sex unless your doctor tells  you not to. Relieving pain and discomfort Wear a good support bra if your breasts are sore. Rest with your legs raised (elevated) if you have leg cramps or low back pain. If you have bulging veins (varicose veins) in your legs: Wear support hose as told by your doctor. Raise your feet for 15 minutes, 3-4 times a day. Limit salt in your food. Safety Wear your seat belt at all times when you are in a car. Talk with your doctor if someone is hurting you or yelling at you. Talk with your doctor if you are feeling sad or have thoughts of hurting yourself. Lifestyle Do not use hot tubs, steam rooms, or saunas. Do not douche. Do not use tampons or scented sanitary pads. Do not use herbal medicines, illegal drugs, or medicines that are not approved by your doctor. Do not drink alcohol. Do not smoke or use any products that contain nicotine or tobacco. If you need help quitting, ask your doctor. Avoid cat litter boxes and soil that is used by cats. These carry germs that can cause harm to the baby and can cause a loss of your baby by miscarriage  or stillbirth. General instructions Keep all follow-up visits. This is important. Ask for help if you need counseling or if you need help with nutrition. Your doctor can give you advice or tell you where to go for help. Visit your dentist. At home, brush your teeth with a soft toothbrush. Floss gently. Write down your questions. Take them to your prenatal visits. Where to find more information American Pregnancy Association: americanpregnancy.org [employer]necologists: www.acog.org Office on Women's Health: Might[employer]co.nz Contact a doctor if: You are dizzy. You have a fever. You have mild cramps or pressure in your lower belly. You have a nagging pain in your belly area. You continue to feel like you may vomit, you vomit, or you have watery poop (diarrhea) for 24 hours or longer. You have a bad-smelling fluid  coming from your vagina. You have pain when you pee. You are exposed to a disease that spreads from person to person, such as chickenpox, measles, Zika virus, HIV, or hepatitis. Get help right away if: You have spotting or bleeding from your vagina. You have very bad belly cramping or pain. You have shortness of breath or chest pain. You have any kind of injury, such as from a fall or a car crash. You have new or increased pain, swelling, or redness in an arm or leg. Summary The first trimester of pregnancy starts on the first day of your last menstrual period until the end of week 12 (months 1 through 3). Eat 4 or 5 small meals a day instead of 3 large meals. Do not smoke or use any products that contain nicotine or tobacco. If you need help quitting, ask your doctor. Keep all follow-up visits. This information is not intended to replace advice given to you by your health care provider. Make sure you discuss any questions you have with your health care provider. Document Revised: 12/20/2019 Document Reviewed: 10/26/2019 Elsevier Patient Education  2024 Elsevier Inc. Commonly Asked Questions During Pregnancy  Cats: A parasite can be excreted in cat feces.  To avoid exposure you need to have another person empty the little box.  If you must empty the litter box you will need to wear gloves.  Wash your hands after handling your cat.  This parasite can also be found in raw or undercooked meat so this should also be avoided.  Colds, Sore Throats, Flu: Please check your medication sheet to see what you can take for symptoms.  If your symptoms are unrelieved by these medications please call the office.  Dental Work: Most any dental work Agricultural consultant recommends is permitted.  X-rays should only be taken during the first trimester if absolutely necessary.  Your abdomen should be shielded with a lead apron during all x-rays.  Please notify your provider prior to receiving any x-rays.  Novocaine is  fine; gas is not recommended.  If your dentist requires a note from [place] prior to dental work please call the office and we will provide one for you.  Exercise: Exercise is an important part of staying healthy during your pregnancy.  You may continue most exercises you were accustomed to prior to pregnancy.  Later in your pregnancy you will most likely notice you have difficulty with activities requiring balance like riding a bicycle.  It is important that you listen to your body and avoid activities that put you at a higher risk of falling.  Adequate rest and staying well hydrated are a must!  If you have questions  about the safety of specific activities ask your provider.    Exposure to Children with illness: Try to avoid obvious exposure; report any symptoms to [place] when noted,  If you have chicken pos, red measles or mumps, you should be immune to these diseases.   Please do not take any vaccines while pregnant unless you have checked with your OB provider.  Fetal Movement: After 28 weeks we recommend you do "kick counts" twice daily.  Lie or sit down in a calm quiet environment and count your baby movements "kicks".  You should feel your baby at least 10 times per hour.  If you have not felt 10 kicks within the first hour get up, walk around and have something sweet to eat or drink then repeat for an additional hour.  If count remains less than 10 per hour notify your provider.  Fumigating: Follow your pest control agent's advice as to how long to stay out of your home.  Ventilate the area well before re-entering.  Hemorrhoids:   Most over-the-counter preparations can be used during pregnancy.  Check your medication to see what is safe to use.  It is important to use a stool softener or fiber in your diet and to drink lots of liquids.  If hemorrhoids seem to be getting worse please call the office.   Hot Tubs:  Hot tubs Jacuzzis and saunas are not recommended while pregnant.  These increase your  internal body temperature and should be avoided.  Intercourse:  Sexual intercourse is safe during pregnancy as long as you are comfortable, unless otherwise advised by your provider.  Spotting may occur after intercourse; report any bright red bleeding that is heavier than spotting.  Labor:  If you know that you are in labor, please go to the hospital.  If you are unsure, please call the office and let us help you decide what to do.  Lifting, straining, etc:  If your job requires heavy lifting or straining please check with your provider for any limitations.  Generally, you should not lift items heavier than that you can lift simply with your hands and arms (no back muscles)  Painting:  Paint fumes do not harm your pregnancy, but may make you ill and should be avoided if possible.  Latex or water based paints have less odor than oils.  Use adequate ventilation while painting.  Permanents & Hair Color:  Chemicals in hair dyes are not recommended as they cause increase hair dryness which can increase hair loss during pregnancy.  " Highlighting" and permanents are allowed.  Dye may be absorbed differently and permanents may not hold as well during pregnancy.  Sunbathing:  Use a sunscreen, as skin burns easily during pregnancy.  Drink plenty of fluids; avoid over heating.  Tanning Beds:  Because their possible side effects are still unknown, tanning beds are not recommended.  Ultrasound Scans:  Routine ultrasounds are performed at approximately 20 weeks.  You will be able to see your baby's general anatomy an if you would like to know the gender this can usually be determined as well.  If it is questionable when you conceived you may also receive an ultrasound early in your pregnancy for dating purposes.  Otherwise ultrasound exams are not routinely performed unless there is a medical necessity.  Although you can request a scan we ask that you pay for it when conducted because insurance does not cover "  patient request" scans.  Work: If your pregnancy proceeds without complications  you may work until your due date, unless your physician or employer advises otherwise.  Round Ligament Pain/Pelvic Discomfort:  Sharp, shooting pains not associated with bleeding are fairly common, usually occurring in the second trimester of pregnancy.  They tend to be worse when standing up or when you remain standing for long periods of time.  These are the result of pressure of certain pelvic ligaments called "round ligaments".  Rest, Tylenol and heat seem to be the most effective relief.  As the womb and fetus grow, they rise out of the pelvis and the discomfort improves.  Please notify the office if your pain seems different than that described.  It may represent a more serious condition.  Common Medications Safe in Pregnancy  Acne:      Constipation:  Benzoyl Peroxide     Colace  Clindamycin      Dulcolax Suppository  Topica Erythromycin     Fibercon  Salicylic Acid      Metamucil         Miralax AVOID:        Senakot   Accutane    Cough:  Retin-A       Cough Drops  Tetracycline      Phenergan w/ Codeine if Rx  Minocycline      Robitussin (Plain & DM)  Antibiotics:     Crabs/Lice:  Ceclor       RID  Cephalosporins    AVOID:  E-Mycins      Kwell  Keflex  Macrobid/Macrodantin   Diarrhea:  Penicillin      Kao-Pectate  Zithromax      Imodium AD         PUSH FLUIDS AVOID:       Cipro     Fever:  Tetracycline      Tylenol (Regular or Extra  Minocycline       Strength)  Levaquin      Extra Strength-Do not          Exceed 8 tabs/24 hrs Caffeine:        200mg /day (equiv. To 1 cup of coffee or  approx. 3 12 oz sodas)         Gas: Cold/Hayfever:       Gas-X  Benadryl      Mylicon  Claritin       Phazyme  **Claritin-D        Chlor-Trimeton    Headaches:  Dimetapp      ASA-Free Excedrin  Drixoral-Non-Drowsy     Cold Compress  Mucinex (Guaifenasin)     Tylenol (Regular or Extra  Sudafed/Sudafed-12  Hour     Strength)  **Sudafed PE Pseudoephedrine   Tylenol Cold & Sinus     Vicks Vapor Rub  Zyrtec  **AVOID if Problems With Blood Pressure         Heartburn: Avoid lying down for at least 1 hour after meals  Aciphex      Maalox     Rash:  Milk of Magnesia     Benadryl    Mylanta       1% Hydrocortisone Cream  Pepcid  Pepcid Complete   Sleep Aids:  Prevacid      Ambien   Prilosec       Benadryl  Rolaids       Chamomile Tea  Tums (Limit 4/day)     Unisom         Tylenol PM         Warm milk-add  vanilla or  Hemorrhoids:       Sugar for taste  Anusol/Anusol H.C.  (RX: Analapram 2.5%)  Sugar Substitutes:  Hydrocortisone OTC     Ok in moderation  Preparation H      Tucks        Vaseline lotion applied to tissue with wiping    Herpes:     Throat:  Acyclovir      Oragel  Famvir  Valtrex     Vaccines:         Flu Shot Leg Cramps:       *Gardasil  Benadryl      Hepatitis A         Hepatitis B Nasal Spray:       Pneumovax  Saline Nasal Spray     Polio Booster         Tetanus Nausea:       Tuberculosis test or PPD  Vitamin B6 25 mg TID   AVOID:    Dramamine      *Gardasil  Emetrol       Live Poliovirus  Ginger Root 250 mg QID    MMR (measles, mumps &  High Complex Carbs @ Bedtime    rebella)  Sea Bands-Accupressure    Varicella (Chickenpox)  Unisom 1/2 tab TID     *No known complications           If received before Pain:         Known pregnancy;   Darvocet       Resume series after  Lortab        Delivery  Percocet    Yeast:   Tramadol      Femstat  Tylenol 3      Gyne-lotrimin  Ultram       Monistat  Vicodin           MISC:         All Sunscreens           Hair Coloring/highlights          Insect Repellant's          (Including DEET)         Mystic Tans

## 2023-06-30 ENCOUNTER — Other Ambulatory Visit: Payer: Self-pay | Admitting: Obstetrics

## 2023-06-30 ENCOUNTER — Ambulatory Visit: Payer: BC Managed Care – PPO

## 2023-06-30 DIAGNOSIS — O3680X0 Pregnancy with inconclusive fetal viability, not applicable or unspecified: Secondary | ICD-10-CM | POA: Diagnosis not present

## 2023-06-30 DIAGNOSIS — Z3481 Encounter for supervision of other normal pregnancy, first trimester: Secondary | ICD-10-CM

## 2023-06-30 DIAGNOSIS — Z3A09 9 weeks gestation of pregnancy: Secondary | ICD-10-CM

## 2023-06-30 DIAGNOSIS — Z3A01 Less than 8 weeks gestation of pregnancy: Secondary | ICD-10-CM

## 2023-07-01 ENCOUNTER — Ambulatory Visit: Payer: BC Managed Care – PPO | Admitting: Family

## 2023-07-07 ENCOUNTER — Encounter: Payer: Self-pay | Admitting: Obstetrics

## 2023-07-15 ENCOUNTER — Other Ambulatory Visit: Payer: Self-pay | Admitting: Family

## 2023-07-15 DIAGNOSIS — Z1231 Encounter for screening mammogram for malignant neoplasm of breast: Secondary | ICD-10-CM

## 2023-07-16 NOTE — Progress Notes (Unsigned)
OBSTETRIC INITIAL PRENATAL VISIT  Subjective:    Lauren [name] is being seen today for her first obstetrical visit.  This {is/is not:9024} a planned pregnancy. She is a 42 y.o. [id number] female at [redacted]w[redacted]d gestation, Estimated Date of Delivery: 02/02/24 with Patient's last menstrual period was 04/28/2023 (exact date).,  consistent with *** week sono. Her obstetrical history is significant for {ob risk factors:10154}. Relationship with FOB: {fob:16621}. Patient {does/does not:19097} intend to breast feed. Pregnancy history fully reviewed.    OB History  Gravida Para Term Preterm AB Living  4 2 2  0 1 2  SAB IAB Ectopic Multiple Live Births  1 0 0 0 2    # Outcome Date GA Lbr Len/2nd Weight Sex Type Anes PTL Lv  4 Current           3 Term 04/07/07 [redacted]w[redacted]d  10 lb 2 oz (4.593 kg) M CS-Unspec  N LIV  2 SAB 11/2005             Birth Comments: Blighted ovum; had D&C  1 Term 02/02/02 [redacted]w[redacted]d  9 lb 7 oz (4.281 kg) F CS-Vac  N LIV     Complications: Failure to Progress in First Stage, Failure to Progress in Second Stage     Name: Lauren [name]    Gynecologic History:  Last pap smear was ***.  Results were {Normal/Abnormal:304960160}.  {Actions; denies-reports:120008} h/o abnormal pap smears in the past.  {Actions; denies-reports:120008} history of STIs.  Contraception prior to conception:    Past Medical History:  Diagnosis Date   Anxiety    Edema    ankle swelling    Family History  Problem Relation Age of Onset   Cancer Mother        essential thrombocythemia, JAK2+   Migraines Mother    Hypertension Mother    Hyperlipidemia Mother    Depression Mother    Sleep apnea Mother    Cancer Father 45       bladder   COPD Father    Healthy Brother    Breast cancer Maternal Grandmother        dx 30s   Pancreatic cancer Maternal Grandmother        dx 103s d. 102s   Non-Hodgkin's lymphoma Maternal Grandfather        d. late 29s   Diabetes Paternal Grandmother        type l   Alzheimer's  disease Paternal Grandfather    Asthma Daughter    Allergies Daughter    Asthma Son    Allergies Son    Breast cancer Maternal Aunt        dx late 62s    Past Surgical History:  Procedure Laterality Date   BARIATRIC SURGERY  02/2014   Gastric Sleeve   BASAL CELL CARCINOMA EXCISION  10/2015   CHOLECYSTECTOMY  02/2014   WISDOM TOOTH EXTRACTION      Social History   Socioeconomic History   Marital status: Married    Spouse name: Lauren [name]   Number of children: 2   Years of education: 14   Highest education level: Not on file  Occupational History   Occupation: works at home for [employer]  Tobacco Use   Smoking status: Never   Smokeless tobacco: Never  Vaping Use   Vaping status: Never Used  Substance and Sexual Activity   Alcohol use: No    Alcohol/week: 0.0 standard drinks of alcohol   Drug use: No   Sexual activity: Yes  Partners: Male    Birth control/protection: None  Other Topics Concern   Not on file  Social History Narrative   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    Husband   2 dogs               Social Drivers of Corporate investment banker Strain: Low Risk  (06/23/2023)   Overall Financial Resource Strain (CARDIA)    Difficulty of Paying Living Expenses: Not hard at all  Food Insecurity: No Food Insecurity (06/23/2023)   Hunger Vital Sign    Worried About Running Out of Food in the Last Year: Never true    Ran Out of Food in the Last Year: Never true  Transportation Needs: No Transportation Needs (06/23/2023)   PRAPARE - Administrator, Civil Service (Medical): No    Lack of Transportation (Non-Medical): No  Physical Activity: Insufficiently Active (06/23/2023)   Exercise Vital Sign    Days of Exercise per Week: 1 day    Minutes of Exercise per Session: 60 min  Stress: Stress Concern Present (06/23/2023)   [employer] of Occupational Health - Occupational  Stress Questionnaire    Feeling of Stress : Very much  Social Connections: Moderately Isolated (06/23/2023)   Social Connection and Isolation Panel [NHANES]    Frequency of Communication with Friends and Family: More than three times a week    Frequency of Social Gatherings with Friends and Family: Twice a week    Attends Religious Services: Never    Database administrator or Organizations: No    Attends Banker Meetings: Never    Marital Status: Married  Catering manager Violence: Not At Risk (06/23/2023)   Humiliation, Afraid, Rape, and Kick questionnaire    Fear of Current or Ex-Partner: No    Emotionally Abused: No    Physically Abused: No    Sexually Abused: No    Current Outpatient Medications on File Prior to Visit  Medication Sig Dispense Refill   buPROPion (WELLBUTRIN XL) 300 MG 24 hr tablet Take 1 tablet (300 mg total) by mouth daily. D/ 150 mg qd 90 tablet 3   cholecalciferol (VITAMIN D3) 25 MCG (1000 UNIT) tablet Take 1,000 Units by mouth daily.     folic acid (FOLVITE) 800 MCG tablet Take 800 mcg by mouth daily.     hydrochlorothiazide (HYDRODIURIL) 12.5 MG tablet Take 1 tablet by mouth once daily 90 tablet 3   magnesium oxide (MAG-OX) 400 (240 Mg) MG tablet Take 400 mg by mouth daily.     prenatal vitamin w/FE, FA (NATACHEW) 29-1 MG CHEW chewable tablet Chew 2 tablets by mouth daily at 12 noon.     Saccharomyces boulardii (PROBIOTIC) 250 MG CAPS Take by mouth.     traZODone (DESYREL) 50 MG tablet Take 0.5-1 tablets (25-50 mg total) by mouth at bedtime as needed for sleep. 90 tablet 3   No current facility-administered medications on file prior to visit.    Allergies  Allergen Reactions   Prozac [Fluoxetine]     No help    Zoloft [Sertraline]     Zombie     Review of Systems General: Not Present- Fever, Weight Loss and Weight Gain. Skin: Not Present- Rash. HEENT: Not Present- Blurred Vision, Headache and Bleeding Gums. Respiratory: Not Present-  Difficulty Breathing. Breast: Not Present- Breast Mass. Cardiovascular:  Not Present- Chest Pain, Elevated Blood Pressure, Fainting / Blacking Out and Shortness of Breath. Gastrointestinal: Not Present- Abdominal Pain, Constipation, Nausea and Vomiting. Female Genitourinary: Not Present- Frequency, Painful Urination, Pelvic Pain, Vaginal Bleeding, Vaginal Discharge, Contractions, regular, Fetal Movements Decreased, Urinary Complaints and Vaginal Fluid. Musculoskeletal: Not Present- Back Pain and Leg Cramps. Neurological: Not Present- Dizziness. Psychiatric: Not Present- Depression.     Objective:   Last menstrual period 04/28/2023.  There is no height or weight on file to calculate BMI.  General Appearance:    Alert, cooperative, no distress, appears stated age  Head:    Normocephalic, without obvious abnormality, atraumatic  Eyes:    PERRL, conjunctiva/corneas clear, EOM's intact, both eyes  Ears:    Normal external ear canals, both ears  Nose:   Nares normal, septum midline, mucosa normal, no drainage or sinus tenderness  Throat:   Lips, mucosa, and tongue normal; teeth and gums normal  Neck:   Supple, symmetrical, trachea midline, no adenopathy; thyroid: no enlargement/tenderness/nodules; no carotid bruit or JVD  Back:     Symmetric, no curvature, ROM normal, no CVA tenderness  Lungs:     Clear to auscultation bilaterally, respirations unlabored  Chest Wall:    No tenderness or deformity   Heart:    Regular rate and rhythm, S1 and S2 normal, no murmur, rub or gallop  Breast Exam:    No tenderness, masses, or nipple abnormality  Abdomen:     Soft, non-tender, bowel sounds active all four quadrants, no masses, no organomegaly.  FHT ***  bpm.  Genitalia:    Pelvic:external genitalia normal, vagina without lesions, discharge, or tenderness, rectovaginal septum  normal. Cervix normal in appearance, no cervical motion tenderness, no adnexal masses or tenderness.  Pregnancy positive findings:  uterine enlargement: *** wk size, nontender.   Rectal:    Normal external sphincter.  No hemorrhoids appreciated. Internal exam not done.   Extremities:   Extremities normal, atraumatic, no cyanosis or edema  Pulses:   2+ and symmetric all extremities  Skin:   Skin color, texture, turgor normal, no rashes or lesions  Lymph nodes:   Cervical, supraclavicular, and axillary nodes normal  Neurologic:   CNII-XII intact, normal strength, sensation and reflexes throughout     Assessment:   No diagnosis found.  Plan:   Supervision of ***normal/high risk pregnancy  - Initial labs reviewed. - Prenatal vitamins encouraged. - Problem list reviewed and updated. - New OB counseling:  The patient has been given an overview regarding routine prenatal care.  Recommendations regarding diet, weight gain, and exercise in pregnancy were given. - Prenatal testing, optional genetic testing, and ultrasound use in pregnancy were reviewed.  Traditional genetic screening vs cell-fee DNA genetic screening discussed, including risks and benefits. Testing {requests/ordered/declines:14581}. - Benefits of Breast Feeding were discussed. The patient is encouraged to consider nursing her baby post partum.  There are no diagnoses linked to this encounter.    Follow up in 4 weeks.    Paula Compton, CNM [facility]

## 2023-07-19 ENCOUNTER — Ambulatory Visit (INDEPENDENT_AMBULATORY_CARE_PROVIDER_SITE_OTHER): Payer: BC Managed Care – PPO | Admitting: Obstetrics

## 2023-07-19 ENCOUNTER — Encounter: Payer: Self-pay | Admitting: Obstetrics

## 2023-07-19 ENCOUNTER — Other Ambulatory Visit (HOSPITAL_COMMUNITY)
Admission: RE | Admit: 2023-07-19 | Discharge: 2023-07-19 | Disposition: A | Discharge status: Home / Self Care | Payer: BC Managed Care – PPO | Source: Ambulatory Visit | Attending: Obstetrics | Admitting: Obstetrics

## 2023-07-19 VITALS — BP 116/72 | HR 85 | Wt 211.4 lb

## 2023-07-19 DIAGNOSIS — Z113 Encounter for screening for infections with a predominantly sexual mode of transmission: Secondary | ICD-10-CM

## 2023-07-19 DIAGNOSIS — Z3A11 11 weeks gestation of pregnancy: Secondary | ICD-10-CM | POA: Insufficient documentation

## 2023-07-19 DIAGNOSIS — Z0283 Encounter for blood-alcohol and blood-drug test: Secondary | ICD-10-CM | POA: Diagnosis not present

## 2023-07-19 DIAGNOSIS — Z130 Encounter for screening for diseases of the blood and blood-forming organs and certain disorders involving the immune mechanism: Secondary | ICD-10-CM

## 2023-07-19 DIAGNOSIS — Z131 Encounter for screening for diabetes mellitus: Secondary | ICD-10-CM

## 2023-07-19 DIAGNOSIS — Z0184 Encounter for antibody response examination: Secondary | ICD-10-CM

## 2023-07-19 DIAGNOSIS — Z3401 Encounter for supervision of normal first pregnancy, first trimester: Secondary | ICD-10-CM

## 2023-07-19 DIAGNOSIS — Z124 Encounter for screening for malignant neoplasm of cervix: Secondary | ICD-10-CM

## 2023-07-19 DIAGNOSIS — Z3480 Encounter for supervision of other normal pregnancy, unspecified trimester: Secondary | ICD-10-CM

## 2023-07-19 DIAGNOSIS — O99210 Obesity complicating pregnancy, unspecified trimester: Secondary | ICD-10-CM

## 2023-07-19 DIAGNOSIS — Z1379 Encounter for other screening for genetic and chromosomal anomalies: Secondary | ICD-10-CM

## 2023-07-19 DIAGNOSIS — Z23 Encounter for immunization: Secondary | ICD-10-CM

## 2023-07-19 NOTE — Patient Instructions (Signed)
Morning Sickness  Morning sickness is when you feel like you may vomit (feel nauseous) during pregnancy. Sometimes, you may vomit. Morning sickness most often happens in the morning, but it can also happen at any time of the day. Some women may have morning sickness that makes them vomit all the time. This is a more serious problem that needs treatment. What are the causes? The cause of this condition is not known. What increases the risk? You had vomiting or a feeling like you may vomit before your pregnancy. You had morning sickness in another pregnancy. You are pregnant with more than one baby, such as twins. What are the signs or symptoms? Feeling like you may vomit. Vomiting. How is this treated? Treatment is usually not needed for this condition. You may only need to change what you eat. In some cases, your doctor may give you some things to take for your condition. These include: Vitamin B6 supplements. Medicines to treat the feeling that you may vomit. Ginger. Follow these instructions at home: Medicines Take over-the-counter and prescription medicines only as told by your doctor. Do not take any medicines until you talk with your doctor about them first. Take multivitamins before you get pregnant. These can stop or lessen the symptoms of morning sickness. Eating and drinking Eat dry toast or crackers before getting out of bed. Eat 5 or 6 small meals a day. Eat dry and bland foods like rice and baked potatoes. Do not eat greasy, fatty, or spicy foods. Have someone cook for you if the smell of food causes you to vomit or to feel like you may vomit. If you feel like you may vomit after taking prenatal vitamins, take them at night or with a snack. Eat protein foods when you need a snack. Nuts, yogurt, and cheese are good choices. Drink fluids throughout the day. Try ginger ale made with real ginger, ginger tea made from fresh grated ginger, or ginger candies. General  instructions Do not smoke or use any products that contain nicotine or tobacco. If you need help quitting, ask your doctor. Use an air purifier to keep the air in your house free of smells. Get lots of fresh air. Try to avoid smells that make you feel sick. Try wearing an acupressure wristband. This is a wristband that is used to treat seasickness. Try a treatment called acupuncture. In this treatment, a doctor puts needles into certain areas of your body to make you feel better. Contact a doctor if: You need medicine to feel better. You feel dizzy or light-headed. You are losing weight. Get help right away if: The feeling that you may vomit will not go away, or you cannot stop vomiting. You faint. You have very bad pain in your belly. Summary Morning sickness is when you feel like you may vomit (feel nauseous) during pregnancy. You may feel sick in the morning, but you can feel this way at any time of the day. Making some changes to what you eat may help your symptoms go away. This information is not intended to replace advice given to you by your health care provider. Make sure you discuss any questions you have with your health care provider. Document Revised: 02/26/2020 Document Reviewed: 02/05/2020 Elsevier Patient Education  2024 [employer].   The first trimester of pregnancy starts on the first day of your last menstrual period until the end of week 12. This is also called months 1 through 3 of pregnancy. Body changes during your first  trimester Your body goes through many changes during pregnancy. The changes usually return to normal after your baby is born. Physical changes You may gain or lose weight. Your breasts may grow larger and hurt. The area around your nipples may get darker. Dark spots or blotches may develop on your face. You may have changes in your hair. Health changes You may feel like you might vomit (nauseous), and you may vomit. You may have  heartburn. You may have headaches. You may have trouble pooping (constipation). Your gums may bleed. Other changes You may get tired easily. You may pee (urinate) more often. Your menstrual periods will stop. You may not feel hungry. You may want to eat certain kinds of food. You may have changes in your emotions from day to day. You may have more dreams. Follow these instructions at home: Medicines Take over-the-counter and prescription medicines only as told by your doctor. Some medicines are not safe during pregnancy. Take a prenatal vitamin that contains at least 600 micrograms (mcg) of folic acid. Eating and drinking Eat healthy meals that include: Fresh fruits and vegetables. Whole grains. Good sources of protein, such as meat, eggs, or tofu. Low-fat dairy products. Avoid raw meat and unpasteurized juice, milk, and cheese. If you feel like you may vomit, or you vomit: Eat 4 or 5 small meals a day instead of 3 large meals. Try eating a few soda crackers. Drink liquids between meals instead of during meals. You may need to take these actions to prevent or treat trouble pooping: Drink enough fluids to keep your pee (urine) pale yellow. Eat foods that are high in fiber. These include beans, whole grains, and fresh fruits and vegetables. Limit foods that are high in fat and sugar. These include fried or sweet foods. Activity Exercise only as told by your doctor. Most people can do their usual exercise routine during pregnancy. Stop exercising if you have cramps or pain in your lower belly (abdomen) or low back. Do not exercise if it is too hot or too humid, or if you are in a place of great height (high altitude). Avoid heavy lifting. If you choose to, you may have sex unless your doctor tells you not to. Relieving pain and discomfort Wear a good support bra if your breasts are sore. Rest with your legs raised (elevated) if you have leg cramps or low back pain. If you have  bulging veins (varicose veins) in your legs: Wear support hose as told by your doctor. Raise your feet for 15 minutes, 3-4 times a day. Limit salt in your food. Safety Wear your seat belt at all times when you are in a car. Talk with your doctor if someone is hurting you or yelling at you. Talk with your doctor if you are feeling sad or have thoughts of hurting yourself. Lifestyle Do not use hot tubs, steam rooms, or saunas. Do not douche. Do not use tampons or scented sanitary pads. Do not use herbal medicines, illegal drugs, or medicines that are not approved by your doctor. Do not drink alcohol. Do not smoke or use any products that contain nicotine or tobacco. If you need help quitting, ask your doctor. Avoid cat litter boxes and soil that is used by cats. These carry germs that can cause harm to the baby and can cause a loss of your baby by miscarriage or stillbirth. General instructions Keep all follow-up visits. This is important. Ask for help if you need counseling or if you need  help with nutrition. Your doctor can give you advice or tell you where to go for help. Visit your dentist. At home, brush your teeth with a soft toothbrush. Floss gently. Write down your questions. Take them to your prenatal visits. Where to find more information American Pregnancy Association: americanpregnancy.org [employer] of Obstetricians and Gynecologists: www.acog.org Office on Women's Health: Mighty[employer]co.nz Contact a doctor if: You are dizzy. You have a fever. You have mild cramps or pressure in your lower belly. You have a nagging pain in your belly area. You continue to feel like you may vomit, you vomit, or you have watery poop (diarrhea) for 24 hours or longer. You have a bad-smelling fluid coming from your vagina. You have pain when you pee. You are exposed to a disease that spreads from person to person, such as chickenpox, measles, Zika virus, HIV, or hepatitis. Get  help right away if: You have spotting or bleeding from your vagina. You have very bad belly cramping or pain. You have shortness of breath or chest pain. You have any kind of injury, such as from a fall or a car crash. You have new or increased pain, swelling, or redness in an arm or leg. Summary The first trimester of pregnancy starts on the first day of your last menstrual period until the end of week 12 (months 1 through 3). Eat 4 or 5 small meals a day instead of 3 large meals. Do not smoke or use any products that contain nicotine or tobacco. If you need help quitting, ask your doctor. Keep all follow-up visits. This information is not intended to replace advice given to you by your health care provider. Make sure you discuss any questions you have with your health care provider. Document Revised: 12/20/2019 Document Reviewed: 10/26/2019 Elsevier Patient Education  2024 Elsevier Inc. Commonly Asked Questions During Pregnancy  Cats: A parasite can be excreted in cat feces.  To avoid exposure you need to have another person empty the little box.  If you must empty the litter box you will need to wear gloves.  Wash your hands after handling your cat.  This parasite can also be found in raw or undercooked meat so this should also be avoided.  Colds, Sore Throats, Flu: Please check your medication sheet to see what you can take for symptoms.  If your symptoms are unrelieved by these medications please call the office.  Dental Work: Most any dental work Agricultural consultant recommends is permitted.  X-rays should only be taken during the first trimester if absolutely necessary.  Your abdomen should be shielded with a lead apron during all x-rays.  Please notify your provider prior to receiving any x-rays.  Novocaine is fine; gas is not recommended.  If your dentist requires a note from [place] prior to dental work please call the office and we will provide one for you.  Exercise: Exercise is an important  part of staying healthy during your pregnancy.  You may continue most exercises you were accustomed to prior to pregnancy.  Later in your pregnancy you will most likely notice you have difficulty with activities requiring balance like riding a bicycle.  It is important that you listen to your body and avoid activities that put you at a higher risk of falling.  Adequate rest and staying well hydrated are a must!  If you have questions about the safety of specific activities ask your provider.    Exposure to Children with illness: Try to avoid obvious exposure;  report any symptoms to [place] when noted,  If you have chicken pos, red measles or mumps, you should be immune to these diseases.   Please do not take any vaccines while pregnant unless you have checked with your OB provider.  Fetal Movement: After 28 weeks we recommend you do "kick counts" twice daily.  Lie or sit down in a calm quiet environment and count your baby movements "kicks".  You should feel your baby at least 10 times per hour.  If you have not felt 10 kicks within the first hour get up, walk around and have something sweet to eat or drink then repeat for an additional hour.  If count remains less than 10 per hour notify your provider.  Fumigating: Follow your pest control agent's advice as to how long to stay out of your home.  Ventilate the area well before re-entering.  Hemorrhoids:   Most over-the-counter preparations can be used during pregnancy.  Check your medication to see what is safe to use.  It is important to use a stool softener or fiber in your diet and to drink lots of liquids.  If hemorrhoids seem to be getting worse please call the office.   Hot Tubs:  Hot tubs Jacuzzis and saunas are not recommended while pregnant.  These increase your internal body temperature and should be avoided.  Intercourse:  Sexual intercourse is safe during pregnancy as long as you are comfortable, unless otherwise advised by your provider.   Spotting may occur after intercourse; report any bright red bleeding that is heavier than spotting.  Labor:  If you know that you are in labor, please go to the hospital.  If you are unsure, please call the office and let us help you decide what to do.  Lifting, straining, etc:  If your job requires heavy lifting or straining please check with your provider for any limitations.  Generally, you should not lift items heavier than that you can lift simply with your hands and arms (no back muscles)  Painting:  Paint fumes do not harm your pregnancy, but may make you ill and should be avoided if possible.  Latex or water based paints have less odor than oils.  Use adequate ventilation while painting.  Permanents & Hair Color:  Chemicals in hair dyes are not recommended as they cause increase hair dryness which can increase hair loss during pregnancy.  " Highlighting" and permanents are allowed.  Dye may be absorbed differently and permanents may not hold as well during pregnancy.  Sunbathing:  Use a sunscreen, as skin burns easily during pregnancy.  Drink plenty of fluids; avoid over heating.  Tanning Beds:  Because their possible side effects are still unknown, tanning beds are not recommended.  Ultrasound Scans:  Routine ultrasounds are performed at approximately 20 weeks.  You will be able to see your baby's general anatomy an if you would like to know the gender this can usually be determined as well.  If it is questionable when you conceived you may also receive an ultrasound early in your pregnancy for dating purposes.  Otherwise ultrasound exams are not routinely performed unless there is a medical necessity.  Although you can request a scan we ask that you pay for it when conducted because insurance does not cover " patient request" scans.  Work: If your pregnancy proceeds without complications you may work until your due date, unless your physician or employer advises otherwise.  Round Ligament  Pain/Pelvic Discomfort:  Sharp, shooting  pains not associated with bleeding are fairly common, usually occurring in the second trimester of pregnancy.  They tend to be worse when standing up or when you remain standing for long periods of time.  These are the result of pressure of certain pelvic ligaments called "round ligaments".  Rest, Tylenol and heat seem to be the most effective relief.  As the womb and fetus grow, they rise out of the pelvis and the discomfort improves.  Please notify the office if your pain seems different than that described.  It may represent a more serious condition.

## 2023-07-20 LAB — URINALYSIS, ROUTINE W REFLEX MICROSCOPIC
Bilirubin, UA: NEGATIVE
Glucose, UA: NEGATIVE
Ketones, UA: NEGATIVE
Leukocytes,UA: NEGATIVE
Nitrite, UA: NEGATIVE
Protein,UA: NEGATIVE
RBC, UA: NEGATIVE
Specific Gravity, UA: 1.027 (ref 1.005–1.030)
Urobilinogen, Ur: 1 mg/dL (ref 0.2–1.0)
pH, UA: 6.5 (ref 5.0–7.5)

## 2023-07-21 ENCOUNTER — Encounter: Payer: Self-pay | Admitting: Obstetrics

## 2023-07-21 LAB — MONITOR DRUG PROFILE 14(MW)
Amphetamine Scrn, Ur: NEGATIVE ng/mL
BARBITURATE SCREEN URINE: NEGATIVE ng/mL
BENZODIAZEPINE SCREEN, URINE: NEGATIVE ng/mL
Buprenorphine, Urine: NEGATIVE ng/mL
CANNABINOIDS UR QL SCN: NEGATIVE ng/mL
Cocaine (Metab) Scrn, Ur: NEGATIVE ng/mL
Creatinine(Crt), U: 164 mg/dL (ref 20.0–300.0)
Fentanyl, Urine: NEGATIVE pg/mL
Meperidine Screen, Urine: NEGATIVE ng/mL
Methadone Screen, Urine: NEGATIVE ng/mL
OXYCODONE+OXYMORPHONE UR QL SCN: NEGATIVE ng/mL
Opiate Scrn, Ur: NEGATIVE ng/mL
Ph of Urine: 6.1 (ref 4.5–8.9)
Phencyclidine Qn, Ur: NEGATIVE ng/mL
Propoxyphene Scrn, Ur: NEGATIVE ng/mL
SPECIFIC GRAVITY: 1.027
Tramadol Screen, Urine: NEGATIVE ng/mL

## 2023-07-21 LAB — URINE CULTURE, OB REFLEX

## 2023-07-21 LAB — CULTURE, OB URINE

## 2023-07-22 ENCOUNTER — Ambulatory Visit: Payer: BC Managed Care – PPO | Admitting: Certified Nurse Midwife

## 2023-07-22 VITALS — BP 109/73 | HR 89 | Wt 211.0 lb

## 2023-07-22 DIAGNOSIS — O4690 Antepartum hemorrhage, unspecified, unspecified trimester: Secondary | ICD-10-CM

## 2023-07-22 DIAGNOSIS — Z3A12 12 weeks gestation of pregnancy: Secondary | ICD-10-CM | POA: Diagnosis not present

## 2023-07-22 DIAGNOSIS — Z6791 Unspecified blood type, Rh negative: Secondary | ICD-10-CM | POA: Insufficient documentation

## 2023-07-22 DIAGNOSIS — O360110 Maternal care for anti-D [Rh] antibodies, first trimester, not applicable or unspecified: Secondary | ICD-10-CM

## 2023-07-22 DIAGNOSIS — O4691 Antepartum hemorrhage, unspecified, first trimester: Secondary | ICD-10-CM

## 2023-07-22 DIAGNOSIS — O26899 Other specified pregnancy related conditions, unspecified trimester: Secondary | ICD-10-CM | POA: Insufficient documentation

## 2023-07-22 HISTORY — DX: Antepartum hemorrhage, unspecified, unspecified trimester: O46.90

## 2023-07-22 LAB — CYTOLOGY - PAP
Chlamydia: NEGATIVE
Comment: NEGATIVE
Comment: NEGATIVE
Comment: NORMAL
Diagnosis: NEGATIVE
High risk HPV: NEGATIVE
Neisseria Gonorrhea: NEGATIVE

## 2023-07-22 MED ORDER — RHO D IMMUNE GLOBULIN 1500 UNIT/2ML IJ SOSY
300.0000 ug | PREFILLED_SYRINGE | Freq: Once | INTRAMUSCULAR | Status: AC
  Administered 2023-07-22: 300 ug via INTRAMUSCULAR

## 2023-07-22 NOTE — Telephone Encounter (Signed)
Patient will come in to be evaluated today per Shanda Bumps.

## 2023-07-22 NOTE — Progress Notes (Signed)
    Problem Prenatal Note   Subjective   42 y.o. [id number] at [redacted]w[redacted]d presents for this problem prenatal visit.  Patient has had bleeding since visit Monday, began Monday afternoon, spotting at times, but heavier on Tuesday, now dark brown. Denies pain, cramping or contractions. Patient reports: Movement: Absent Contractions: Not present  Objective   Flow sheet Vitals: Pulse Rate: 89 BP: 109/73 Fetal Heart Rate (bpm): 150 Total weight gain: 3 lb (1.361 kg)  General Appearance  No acute distress, well appearing, and well nourished Pulmonary   Normal work of breathing Genitourinary   NEFG, speculum exam reveals small amount dark brown mucoid discharge, cervical os visually closed. No active bleeding. Neurologic   Alert and oriented to person, place, and time Psychiatric   Mood and affect within normal limits  Assessment/Plan   Plan  42 y.o. [id number] at [redacted]w[redacted]d presents for problem OB visit. Reviewed prenatal record including previous visit note.  Rh negative state in antepartum period Rhogam today, repeat at 28w.  Vaginal bleeding in pregnancy +FHT on doppler today, no active bleeding. Bleeding likely secondary to friability after pap Monday, reinforced bleeding precautions. Return for next scheduled visit 1/7.      No orders of the defined types were placed in this encounter.  No follow-ups on file.   Future Appointments  Date Time Provider Department Center  07/30/2023  2:20 PM [facility]  08/03/2023  9:20 AM AOB-OBGYN LAB [facility] None  08/03/2023 10:15 AM Dominica Severin, CNM AOB-AOB None  08/16/2023  8:55 AM Glenetta Borg, CNM AOB-AOB None    For next visit:  continue with routine prenatal care     Dominica Severin, CNM  12/26/241:22 PM

## 2023-07-22 NOTE — Assessment & Plan Note (Signed)
Rhogam today, repeat at 28w.

## 2023-07-22 NOTE — Assessment & Plan Note (Addendum)
+  FHT on doppler today, no active bleeding. Bleeding likely secondary to friability after pap Monday, reinforced bleeding precautions. Return for next scheduled visit 1/7.

## 2023-07-24 LAB — MATERNIT 21 PLUS CORE, BLOOD
Fetal Fraction: 20
Result (T21): NEGATIVE
Trisomy 13 (Patau syndrome): NEGATIVE
Trisomy 18 (Edwards syndrome): NEGATIVE
Trisomy 21 (Down syndrome): NEGATIVE

## 2023-07-25 ENCOUNTER — Encounter: Payer: Self-pay | Admitting: Certified Nurse Midwife

## 2023-07-25 LAB — HGB FRACTIONATION CASCADE
Hgb A2: 3 % (ref 1.8–3.2)
Hgb A: 97 % (ref 96.4–98.8)
Hgb F: 0 % (ref 0.0–2.0)
Hgb S: 0 %

## 2023-07-25 LAB — CBC/D/PLT+RPR+RH+ABO+RUBIGG...
Antibody Screen: NEGATIVE
Basophils Absolute: 0 10*3/uL (ref 0.0–0.2)
Basos: 1 %
EOS (ABSOLUTE): 0.1 10*3/uL (ref 0.0–0.4)
Eos: 1 %
HCV Ab: NONREACTIVE
HIV Screen 4th Generation wRfx: NONREACTIVE
Hematocrit: 42.5 % (ref 34.0–46.6)
Hemoglobin: 14.2 g/dL (ref 11.1–15.9)
Hepatitis B Surface Ag: NEGATIVE
Immature Grans (Abs): 0 10*3/uL (ref 0.0–0.1)
Immature Granulocytes: 0 %
Lymphocytes Absolute: 1.7 10*3/uL (ref 0.7–3.1)
Lymphs: 27 %
MCH: 32.3 pg (ref 26.6–33.0)
MCHC: 33.4 g/dL (ref 31.5–35.7)
MCV: 97 fL (ref 79–97)
Monocytes Absolute: 0.4 10*3/uL (ref 0.1–0.9)
Monocytes: 6 %
Neutrophils Absolute: 4.2 10*3/uL (ref 1.4–7.0)
Neutrophils: 65 %
Platelets: 178 10*3/uL (ref 150–450)
RBC: 4.39 x10E6/uL (ref 3.77–5.28)
RDW: 12 % (ref 11.7–15.4)
RPR Ser Ql: NONREACTIVE
Rh Factor: NEGATIVE
Rubella Antibodies, IGG: 1.09 {index} (ref >0.99)
Varicella zoster IgG: REACTIVE
WBC: 6.3 10*3/uL (ref 3.4–10.8)

## 2023-07-25 LAB — HEMOGLOBIN A1C
Est. average glucose Bld gHb Est-mCnc: 94 mg/dL
Hgb A1c MFr Bld: 4.9 % (ref 4.8–5.6)

## 2023-07-25 LAB — HCV INTERPRETATION

## 2023-07-25 LAB — TSH+FREE T4
Free T4: 1.34 ng/dL (ref 0.82–1.77)
TSH: 0.341 u[IU]/mL — ABNORMAL LOW (ref 0.450–4.500)

## 2023-07-28 HISTORY — DX: Maternal care for unspecified type scar from previous cesarean delivery: O34.219

## 2023-07-29 ENCOUNTER — Encounter: Payer: Self-pay | Admitting: Obstetrics

## 2023-07-29 DIAGNOSIS — R7989 Other specified abnormal findings of blood chemistry: Secondary | ICD-10-CM

## 2023-07-30 ENCOUNTER — Ambulatory Visit
Admission: RE | Admit: 2023-07-30 | Discharge: 2023-07-30 | Disposition: A | Discharge status: Home / Self Care | Payer: BC Managed Care – PPO | Source: Ambulatory Visit | Attending: Family

## 2023-07-30 DIAGNOSIS — Z1231 Encounter for screening mammogram for malignant neoplasm of breast: Secondary | ICD-10-CM | POA: Insufficient documentation

## 2023-08-03 ENCOUNTER — Encounter: Payer: Self-pay | Admitting: Certified Nurse Midwife

## 2023-08-03 ENCOUNTER — Other Ambulatory Visit: Payer: BC Managed Care – PPO

## 2023-08-03 ENCOUNTER — Ambulatory Visit (INDEPENDENT_AMBULATORY_CARE_PROVIDER_SITE_OTHER): Payer: BC Managed Care – PPO | Admitting: Certified Nurse Midwife

## 2023-08-03 VITALS — BP 122/82 | HR 89 | Wt 216.6 lb

## 2023-08-03 DIAGNOSIS — O0992 Supervision of high risk pregnancy, unspecified, second trimester: Secondary | ICD-10-CM

## 2023-08-03 DIAGNOSIS — Z9884 Bariatric surgery status: Secondary | ICD-10-CM

## 2023-08-03 DIAGNOSIS — R7989 Other specified abnormal findings of blood chemistry: Secondary | ICD-10-CM

## 2023-08-03 DIAGNOSIS — Z6791 Unspecified blood type, Rh negative: Secondary | ICD-10-CM

## 2023-08-03 DIAGNOSIS — Z3A13 13 weeks gestation of pregnancy: Secondary | ICD-10-CM

## 2023-08-03 DIAGNOSIS — Z363 Encounter for antenatal screening for malformations: Secondary | ICD-10-CM

## 2023-08-03 DIAGNOSIS — O26899 Other specified pregnancy related conditions, unspecified trimester: Secondary | ICD-10-CM

## 2023-08-03 DIAGNOSIS — Z131 Encounter for screening for diabetes mellitus: Secondary | ICD-10-CM

## 2023-08-03 DIAGNOSIS — O09522 Supervision of elderly multigravida, second trimester: Secondary | ICD-10-CM

## 2023-08-03 LAB — POCT URINALYSIS DIPSTICK OB
Bilirubin, UA: NEGATIVE
Blood, UA: NEGATIVE
Glucose, UA: NEGATIVE
Ketones, UA: NEGATIVE
Leukocytes, UA: NEGATIVE
Nitrite, UA: NEGATIVE
POC,PROTEIN,UA: NEGATIVE
Spec Grav, UA: 1.02 (ref 1.010–1.025)
Urobilinogen, UA: 0.2 U/dL
pH, UA: 5 (ref 5.0–8.0)

## 2023-08-03 NOTE — Assessment & Plan Note (Signed)
 Received Rhogam after episode of bleeding. Repeat at 28w after antibody screen.

## 2023-08-03 NOTE — Progress Notes (Signed)
    Return Prenatal Note   Subjective   43 y.o. Lauren [name] at [redacted]w[redacted]d presents for this follow-up prenatal visit.  Patient feeling well, bleeding resolved after last visit, had dark red to brown discharge for about a week. Completing early 1h gtt today. Patient reports: Movement: Absent Contractions: Not present  Objective   Flow sheet Vitals: Pulse Rate: 89 BP: 122/82 Fetal Heart Rate (bpm): 140 Total weight gain: 8 lb 9.6 oz (3.901 kg)  General Appearance  No acute distress, well appearing, and well nourished Pulmonary   Normal work of breathing Neurologic   Alert and oriented to person, place, and time Psychiatric   Mood and affect within normal limits  Assessment/Plan   Plan  43 y.o. Lauren [name] at [redacted]w[redacted]d presents for follow-up OB visit. Reviewed prenatal record including previous visit note.  Rh negative state in antepartum period Received Rhogam after episode of bleeding. Repeat at 28w after antibody screen.  Supervision of other normal pregnancy, antepartum Red flag symptoms reviewed. Repeat TSH/T4 today, TSH low but collected in first trimester. Anatomy ultrasound ordered.      Orders Placed This Encounter  Procedures   US  OB Comp + 14 Wk    Standing Status:   Future    Expected Date:   09/10/2023    Expiration Date:   11/01/2023    Reason for Exam (SYMPTOM  OR DIAGNOSIS REQUIRED):   anatomy ultrasound    Preferred Imaging Location?:   [facility]   TSH + free T4    Add on  to specimen already collected   POC Urinalysis Dipstick OB   Return in 4 weeks (on 08/31/2023) for ROB.   Future Appointments  Date Time Provider Department Center  08/16/2023  8:55 AM Justino Eleanor HERO, CNM AOB-AOB None  09/07/2023  8:15 AM AOB-AOB US  1 AOB-IMG None    For next visit:  continue with routine prenatal care     Lauren Lauren [name], CNM  08/02/2510:21 PM

## 2023-08-03 NOTE — Patient Instructions (Signed)

## 2023-08-03 NOTE — Assessment & Plan Note (Signed)
 Red flag symptoms reviewed. Repeat TSH/T4 today, TSH low but collected in first trimester. Anatomy ultrasound ordered.

## 2023-08-04 LAB — GLUCOSE TOLERANCE, 1 HOUR: Glucose, 1Hr PP: 77 mg/dL (ref 70–199)

## 2023-08-06 LAB — TSH+FREE T4
Free T4: 1.2 ng/dL (ref 0.82–1.77)
TSH: 1.15 u[IU]/mL (ref 0.450–4.500)

## 2023-08-06 LAB — SPECIMEN STATUS REPORT

## 2023-08-16 ENCOUNTER — Encounter: Payer: Self-pay | Admitting: Obstetrics

## 2023-08-16 ENCOUNTER — Ambulatory Visit (INDEPENDENT_AMBULATORY_CARE_PROVIDER_SITE_OTHER): Payer: BC Managed Care – PPO | Admitting: Obstetrics

## 2023-08-16 VITALS — BP 111/76 | HR 102 | Wt 216.0 lb

## 2023-08-16 DIAGNOSIS — Z3A15 15 weeks gestation of pregnancy: Secondary | ICD-10-CM

## 2023-08-16 DIAGNOSIS — Z3480 Encounter for supervision of other normal pregnancy, unspecified trimester: Secondary | ICD-10-CM

## 2023-08-16 NOTE — Progress Notes (Signed)
    Return Prenatal Note   Assessment/Plan   Plan  43 y.o. [id number] at [redacted]w[redacted]d presents for follow-up OB visit. Reviewed prenatal record including previous visit note.  Supervision of other normal pregnancy, antepartum -Anatomy scan scheduled for 09/07/23 -Discussed healthy eating habits and weight gain in pregnancy -Interested in VBAC. Needs counseling appt with MD   No orders of the defined types were placed in this encounter.  Return in about 4 weeks (around 09/13/2023).   Future Appointments  Date Time Provider Department Center  09/07/2023  8:15 AM AOB-AOB [place] 1 AOB-IMG None    For next visit:  Routine prenatal care    Subjective   Lauren [name] is feeling less fatigued. She is concerned about weight gain. She is interested in [facility] but has concerns about uterine rupture. Movement: Present Contractions: Not present  Objective   Flow sheet Vitals: Pulse Rate: (!) 102 BP: 111/76 Fetal Heart Rate (bpm): 145 Total weight gain: 8 lb (3.629 kg)  General Appearance  No acute distress, well appearing, and well nourished Pulmonary   Normal work of breathing Neurologic   Alert and oriented to person, place, and time Psychiatric   Mood and affect within normal limits  [name], CNM 08/16/23 9:14 AM

## 2023-08-16 NOTE — Assessment & Plan Note (Signed)
-  Anatomy scan scheduled for 09/07/23 -Discussed healthy eating habits and weight gain in pregnancy -Interested in VBAC. Needs counseling appt with MD

## 2023-09-07 ENCOUNTER — Encounter: Payer: Self-pay | Admitting: Obstetrics

## 2023-09-07 ENCOUNTER — Ambulatory Visit: Payer: BC Managed Care – PPO

## 2023-09-07 ENCOUNTER — Ambulatory Visit (INDEPENDENT_AMBULATORY_CARE_PROVIDER_SITE_OTHER): Payer: BC Managed Care – PPO | Admitting: Obstetrics

## 2023-09-07 VITALS — BP 120/80 | HR 88 | Wt 218.0 lb

## 2023-09-07 DIAGNOSIS — O34219 Maternal care for unspecified type scar from previous cesarean delivery: Secondary | ICD-10-CM

## 2023-09-07 DIAGNOSIS — O99842 Bariatric surgery status complicating pregnancy, second trimester: Secondary | ICD-10-CM | POA: Diagnosis not present

## 2023-09-07 DIAGNOSIS — E669 Obesity, unspecified: Secondary | ICD-10-CM

## 2023-09-07 DIAGNOSIS — Z363 Encounter for antenatal screening for malformations: Secondary | ICD-10-CM | POA: Diagnosis not present

## 2023-09-07 DIAGNOSIS — Z3A18 18 weeks gestation of pregnancy: Secondary | ICD-10-CM | POA: Diagnosis not present

## 2023-09-07 DIAGNOSIS — O09522 Supervision of elderly multigravida, second trimester: Secondary | ICD-10-CM | POA: Diagnosis not present

## 2023-09-07 DIAGNOSIS — O0992 Supervision of high risk pregnancy, unspecified, second trimester: Secondary | ICD-10-CM

## 2023-09-07 DIAGNOSIS — E66812 Obesity, class 2: Secondary | ICD-10-CM

## 2023-09-07 DIAGNOSIS — O10112 Pre-existing hypertensive heart disease complicating pregnancy, second trimester: Secondary | ICD-10-CM | POA: Diagnosis not present

## 2023-09-07 DIAGNOSIS — I10 Essential (primary) hypertension: Secondary | ICD-10-CM

## 2023-09-07 DIAGNOSIS — O0990 Supervision of high risk pregnancy, unspecified, unspecified trimester: Secondary | ICD-10-CM | POA: Diagnosis not present

## 2023-09-07 DIAGNOSIS — E538 Deficiency of other specified B group vitamins: Secondary | ICD-10-CM | POA: Diagnosis not present

## 2023-09-07 DIAGNOSIS — O99212 Obesity complicating pregnancy, second trimester: Secondary | ICD-10-CM | POA: Diagnosis not present

## 2023-09-07 DIAGNOSIS — Z9884 Bariatric surgery status: Secondary | ICD-10-CM

## 2023-09-07 NOTE — Progress Notes (Signed)
    Return Prenatal Note   Subjective  43 y.o. [id number] at [redacted]w[redacted]d presents for this follow-up prenatal visit. Pregnancy notable for chronic hypertension, BMI, AMA, hx of gastric sleeve, and prior CD x 2 desiring TOLAC.   Patient feels well today.   Hx of sleeve Does endorse hx of multiple vitamin deficiencies after her gastric sleeve, is not taking supplementation other than PNV gummies. Baseline labs not done.   cHTN Prior to pregnancy was taking hydrochlorothiazide, DC once pregnant and not on medications, denies hx of preeclampsia and is unable to tolerate ASA due to hx of sleeve. Baseline PIH labs not done.   Anatomy US earlier today incomplete for heart and spine.   Patient reports: Movement: Present Contractions: Not present Denies vaginal bleeding or leaking fluid. Objective  Flow sheet Vitals: Pulse Rate: 88 BP: 120/80 Fetal Heart Rate (bpm): 145 Total weight gain: 10 lb (4.536 kg)  General Appearance  No acute distress, well appearing, and well nourished Pulmonary   Normal work of breathing Neurologic   Alert and oriented to person, place, and time Psychiatric   Mood and affect within normal limits  Assessment/Plan   Plan  43 y.o. [id number] at [redacted]w[redacted]d by LMP=9wk presents for follow-up OB visit. Reviewed prenatal record including previous visit note.  1. Supervision of high risk pregnancy, antepartum (Primary) -Anatomy US earlier today in office incomplete for heart/spine, also echogenic kidney -- is scheduled for follow up in 2wks.   2. Essential (primary) hypertension - Not on medication and BP wnl today - Not on ASA due to hx of gastric sleeve - Baseline PIH labs obtained today  3. History of gastric bypass - Micronutrient labs obtained today, will need supplementation based on results - Encouraged pt to switch from gummies to PNV tabs/caps  4. Obesity, Class II, BMI 35-39.9 - BMI 39.87 today - Anesthesia consult if BMI >45   Orders Placed This Encounter   Procedures   Iron, TIBC and Ferritin Panel   B12 and Folate Panel   Vitamin B1   Vitamin D (25 hydroxy)   Calcium   Comp Met (CMET)   Protein / creatinine ratio, urine   Return in about 4 weeks (around 10/05/2023) for 4 weeks rob, 2 weeks for f/up anatomy.   Future Appointments  Date Time Provider Department Center  09/24/2023 10:15 AM AOB-AOB [place] 1 AOB-IMG None  10/05/2023 10:15 AM Julieanne Manson, MD AOB-AOB None    For next visit:  continue with routine prenatal care   Julieanne Manson, DO [facility]

## 2023-09-08 ENCOUNTER — Encounter: Payer: Self-pay | Admitting: Obstetrics

## 2023-09-08 LAB — PROTEIN / CREATININE RATIO, URINE
Creatinine, Urine: 137.4 mg/dL
Protein, Ur: 11.1 mg/dL
Protein/Creat Ratio: 81 mg/g{creat} (ref 0–200)

## 2023-09-13 LAB — B12 AND FOLATE PANEL
Folate: 18.8 ng/mL (ref >3.0)
Vitamin B-12: 705 pg/mL (ref 232–1245)

## 2023-09-13 LAB — COMPREHENSIVE METABOLIC PANEL
ALT: 13 [IU]/L (ref 0–32)
AST: 16 [IU]/L (ref 0–40)
Albumin: 3.9 g/dL (ref 3.9–4.9)
Alkaline Phosphatase: 75 [IU]/L (ref 44–121)
BUN/Creatinine Ratio: 24 — ABNORMAL HIGH (ref 9–23)
BUN: 12 mg/dL (ref 6–24)
Bilirubin Total: 0.4 mg/dL (ref 0.0–1.2)
CO2: 20 mmol/L (ref 20–29)
Calcium: 9.5 mg/dL (ref 8.7–10.2)
Chloride: 104 mmol/L (ref 96–106)
Creatinine, Ser: 0.51 mg/dL — ABNORMAL LOW (ref 0.57–1.00)
Globulin, Total: 2.4 g/dL (ref 1.5–4.5)
Glucose: 76 mg/dL (ref 70–99)
Potassium: 3.9 mmol/L (ref 3.5–5.2)
Sodium: 138 mmol/L (ref 134–144)
Total Protein: 6.3 g/dL (ref 6.0–8.5)
eGFR: 119 mL/min/{1.73_m2} (ref >59)

## 2023-09-13 LAB — IRON,TIBC AND FERRITIN PANEL
Ferritin: 87 ng/mL (ref 15–150)
Iron Saturation: 25 % (ref 15–55)
Iron: 98 ug/dL (ref 27–159)
Total Iron Binding Capacity: 400 ug/dL (ref 250–450)
UIBC: 302 ug/dL (ref 131–425)

## 2023-09-13 LAB — VITAMIN D 25 HYDROXY (VIT D DEFICIENCY, FRACTURES): Vit D, 25-Hydroxy: 46.4 ng/mL (ref 30.0–100.0)

## 2023-09-13 LAB — VITAMIN B1: Thiamine: 144.9 nmol/L (ref 66.5–200.0)

## 2023-09-21 ENCOUNTER — Other Ambulatory Visit: Payer: Self-pay | Admitting: Obstetrics

## 2023-09-21 DIAGNOSIS — Z362 Encounter for other antenatal screening follow-up: Secondary | ICD-10-CM

## 2023-09-24 ENCOUNTER — Ambulatory Visit (INDEPENDENT_AMBULATORY_CARE_PROVIDER_SITE_OTHER): Payer: BC Managed Care – PPO

## 2023-09-24 DIAGNOSIS — Z362 Encounter for other antenatal screening follow-up: Secondary | ICD-10-CM

## 2023-09-24 DIAGNOSIS — Z3A21 21 weeks gestation of pregnancy: Secondary | ICD-10-CM

## 2023-09-27 DIAGNOSIS — R946 Abnormal results of thyroid function studies: Secondary | ICD-10-CM | POA: Diagnosis not present

## 2023-09-27 DIAGNOSIS — Z3A21 21 weeks gestation of pregnancy: Secondary | ICD-10-CM | POA: Diagnosis not present

## 2023-10-05 ENCOUNTER — Ambulatory Visit (INDEPENDENT_AMBULATORY_CARE_PROVIDER_SITE_OTHER): Payer: BC Managed Care – PPO | Admitting: Obstetrics

## 2023-10-05 VITALS — BP 120/64 | HR 96 | Wt 222.0 lb

## 2023-10-05 DIAGNOSIS — O10012 Pre-existing essential hypertension complicating pregnancy, second trimester: Secondary | ICD-10-CM

## 2023-10-05 DIAGNOSIS — E66813 Obesity, class 3: Secondary | ICD-10-CM

## 2023-10-05 DIAGNOSIS — O10919 Unspecified pre-existing hypertension complicating pregnancy, unspecified trimester: Secondary | ICD-10-CM

## 2023-10-05 DIAGNOSIS — O360120 Maternal care for anti-D [Rh] antibodies, second trimester, not applicable or unspecified: Secondary | ICD-10-CM

## 2023-10-05 DIAGNOSIS — O99212 Obesity complicating pregnancy, second trimester: Secondary | ICD-10-CM | POA: Diagnosis not present

## 2023-10-05 DIAGNOSIS — Z3A22 22 weeks gestation of pregnancy: Secondary | ICD-10-CM

## 2023-10-05 DIAGNOSIS — O0990 Supervision of high risk pregnancy, unspecified, unspecified trimester: Secondary | ICD-10-CM

## 2023-10-05 DIAGNOSIS — O26899 Other specified pregnancy related conditions, unspecified trimester: Secondary | ICD-10-CM

## 2023-10-05 HISTORY — DX: Unspecified pre-existing hypertension complicating pregnancy, unspecified trimester: O10.919

## 2023-10-05 NOTE — Progress Notes (Signed)
    Return Prenatal Note   Subjective  43 y.o. [id number] at [redacted]w[redacted]d presents for this follow-up prenatal visit. Pregnancy notable for chronic hypertension, BMI, AMA, hx of gastric sleeve, and prior CD x 2 desiring TOLAC.   Patient has no concerns today. Anatomy US was completed on 2/28, all views seen.   Hx of sleeve -Baseline labs wnl -Switched to prenatal tabs with iron from the gummies   cHTN Prior to pregnancy was taking hydrochlorothiazide, DC once pregnant and not on medications, denies hx of preeclampsia and is unable to tolerate ASA due to hx of sleeve.  -Baseline PIH labs WNL  BMI -Early 1h = 77   Patient reports: Movement: Present Contractions: Not present Denies vaginal bleeding or leaking fluid. Objective  Flow sheet Vitals: Pulse Rate: 96 BP: 120/64 Fundal Height: 23 cm Fetal Heart Rate (bpm): 137 Total weight gain: 14 lb (6.35 kg)  General Appearance  No acute distress, well appearing, and well nourished Pulmonary   Normal work of breathing Neurologic   Alert and oriented to person, place, and time Psychiatric   Mood and affect within normal limits  Assessment/Plan   Plan  43 y.o. [id number] at [redacted]w[redacted]d by LMP=9wk [place] presents for follow-up OB visit. Reviewed prenatal record including previous visit note.  1. Supervision of high risk pregnancy, antepartum (Primary) - Discussed labs/testing for next visit: 1hGTT, 28wk labs, Rhogam, Tdap - Offered alternative for 1hGTT (gastric sleeve), declines and is ok with glucola  2. HTN in pregnancy, chronic - Stable, off medications - Q4wk growth [place]  3. Rh negative state in antepartum period - Rhogam at 28wks  4. Obesity, Class III, BMI 40-49.9 (morbid obesity) (HCC) - Discussed current weight gain and goal of 20lbs or less for entire pregnancy.  - BMI 40 today, growth [place] (above for cHTN as well); start weekly NSTs at 34wks   Orders Placed This Encounter  Procedures   US OB Follow Up    Standing Status:   Future     Expected Date:   10/22/2023    Expiration Date:   10/04/2024    Reason for exam::   Growth for cHTN, BMI    Preferred imaging location?:   Internal   28 Weeks RH-Panel    Standing Status:   Future    Expected Date:   11/09/2023    Expiration Date:   10/04/2024   Return in about 5 weeks (around 11/09/2023) for ROB w/1hGTT and 28wk labs.   Future Appointments  Date Time Provider Department Center  11/10/2023  9:20 AM AOB-OBGYN LAB AOB-AOB None  11/10/2023  9:55 AM Julieanne Manson, MD AOB-AOB None  11/10/2023 10:15 AM AOB-AOB [place] 1 AOB-IMG None    For next visit:  ROB with 1 hour gluocla, third trimester labs, and Tdap    Julieanne Manson, DO [facility]

## 2023-10-29 ENCOUNTER — Ambulatory Visit

## 2023-10-29 DIAGNOSIS — O10012 Pre-existing essential hypertension complicating pregnancy, second trimester: Secondary | ICD-10-CM | POA: Diagnosis not present

## 2023-10-29 DIAGNOSIS — O99212 Obesity complicating pregnancy, second trimester: Secondary | ICD-10-CM | POA: Diagnosis not present

## 2023-10-29 DIAGNOSIS — E66813 Obesity, class 3: Secondary | ICD-10-CM

## 2023-10-29 DIAGNOSIS — O10919 Unspecified pre-existing hypertension complicating pregnancy, unspecified trimester: Secondary | ICD-10-CM

## 2023-10-29 DIAGNOSIS — Z3A27 27 weeks gestation of pregnancy: Secondary | ICD-10-CM | POA: Diagnosis not present

## 2023-11-10 ENCOUNTER — Other Ambulatory Visit

## 2023-11-10 ENCOUNTER — Ambulatory Visit: Admitting: Obstetrics

## 2023-11-10 VITALS — BP 119/78 | HR 106 | Wt 234.0 lb

## 2023-11-10 DIAGNOSIS — O10013 Pre-existing essential hypertension complicating pregnancy, third trimester: Secondary | ICD-10-CM

## 2023-11-10 DIAGNOSIS — Z3A28 28 weeks gestation of pregnancy: Secondary | ICD-10-CM

## 2023-11-10 DIAGNOSIS — Z6791 Unspecified blood type, Rh negative: Secondary | ICD-10-CM | POA: Diagnosis not present

## 2023-11-10 DIAGNOSIS — Z23 Encounter for immunization: Secondary | ICD-10-CM | POA: Diagnosis not present

## 2023-11-10 DIAGNOSIS — O360130 Maternal care for anti-D [Rh] antibodies, third trimester, not applicable or unspecified: Secondary | ICD-10-CM

## 2023-11-10 DIAGNOSIS — O34219 Maternal care for unspecified type scar from previous cesarean delivery: Secondary | ICD-10-CM | POA: Diagnosis not present

## 2023-11-10 DIAGNOSIS — O0990 Supervision of high risk pregnancy, unspecified, unspecified trimester: Secondary | ICD-10-CM | POA: Diagnosis not present

## 2023-11-10 DIAGNOSIS — O26899 Other specified pregnancy related conditions, unspecified trimester: Secondary | ICD-10-CM

## 2023-11-10 DIAGNOSIS — O99213 Obesity complicating pregnancy, third trimester: Secondary | ICD-10-CM | POA: Diagnosis not present

## 2023-11-10 DIAGNOSIS — O10919 Unspecified pre-existing hypertension complicating pregnancy, unspecified trimester: Secondary | ICD-10-CM

## 2023-11-10 DIAGNOSIS — O09523 Supervision of elderly multigravida, third trimester: Secondary | ICD-10-CM | POA: Diagnosis not present

## 2023-11-10 DIAGNOSIS — Z130 Encounter for screening for diseases of the blood and blood-forming organs and certain disorders involving the immune mechanism: Secondary | ICD-10-CM

## 2023-11-10 HISTORY — DX: Supervision of elderly multigravida, third trimester: O09.523

## 2023-11-10 MED ORDER — RHO D IMMUNE GLOBULIN 1500 UNIT/2ML IJ SOSY
300.0000 ug | PREFILLED_SYRINGE | Freq: Once | INTRAMUSCULAR | Status: AC
Start: 2023-11-10 — End: 2023-11-10
  Administered 2023-11-10: 300 ug via INTRAMUSCULAR

## 2023-11-10 NOTE — Progress Notes (Signed)
    Return Prenatal Note   Subjective  43 y.o. [id number] at [redacted]w[redacted]d presents for this follow-up prenatal visit. Pregnancy notable for chronic hypertension not on medications, BMI, AMA, hx of gastric sleeve, and prior CD x 2 desiring TOLAC.   Hx of sleeve: tolerating glucola ok today, baseline labs wnl  cHTN: BP in range, denies HA, vision changes, RUQ pain, swelling. Patient reports her heart rate has been up , she can feel her heart racing at times. Does not have shortness of breath with this. Is sometimes 101 per her monitor. Recent growth [place] on 4/4 at 26.2 showed EFW 82%ile, AC 89%, AFI 19.4  Needs TOLAC counseling;  28 week labs, tdap and BTC and Rhogam today.  Patient reports: Movement: Present Contractions: Not present Denies vaginal bleeding or leaking fluid. Objective  Flow sheet Vitals: Pulse Rate: (!) 106 BP: 119/78 Fundal Height: 29 cm Fetal Heart Rate (bpm): 133 Total weight gain: 26 lb (11.8 kg)  General Appearance  No acute distress, well appearing, and well nourished Pulmonary   Normal work of breathing Neurologic   Alert and oriented to person, place, and time Psychiatric   Mood and affect within normal limits  Assessment/Plan   Plan  43 y.o. [id number] at [redacted]w[redacted]d by LMP=9wk [place] presents for follow-up OB visit. Reviewed prenatal record including previous visit note. 1. Supervision of high risk pregnancy, antepartum (Primary) -28wk labs, BTC, Tdap completed today -O-NEG: Rhogam given today  2. HTN in pregnancy, chronic -Stable, no anti-HTN medications -Most recent EFW at 26wks = 82%ile -Continue Q4wk growth [place], next scheduled 5/2  3. AMA -NIPT negative, anatomy US without gross anomalies -Is getting growths [place] for cHTN already -Start weekly NSTs at 34wks  4. Obesity, Class III, BMI 40-49.9 (morbid obesity) (HCC) -BMI 42.8 -Is getting growths [place] for cHTN already -Start weekly NSTs at 34wks -Anesthesia consult if BMI >=45  5. Hx of prior CD x 2 -Desires  TOLAC.  -Discussed risks vs benefits of TOLAC vs repeat C-section.  Risk of uterine rupture at term is ~ 1%.  Risk of failed trial of labor after cesarean (TOLAC) without a vaginal birth after cesarean (VBAC) resulting in repeat cesarean delivery (RCD) in about 20 to 40 percent of women who attempt VBAC.  Risk of requiring emergency C-section due to uterine rupture discussed. The benefits of a trial of labor after cesarean (TOLAC) resulting in a vaginal birth after cesarean (VBAC) include the following: shorter length of hospital stay and postpartum recovery (in most cases); fewer complications, such as postpartum fever, wound or uterine infection, thromboembolism (blood clots in the leg or lung), need for blood transfusion and fewer neonatal breathing problems.  Patient notes understanding.  All questions answered.  Patient elects for TOLAC, consent signed 11/10/2023.  Orders Placed This Encounter  Procedures   Tdap vaccine greater than or equal to 43yo IM   CBC    Future Appointments  Date Time Provider Department Center  11/25/2023  8:35 AM Julieanne Manson, MD AOB-AOB None  11/26/2023 10:15 AM AOB-AOB [place] 1 AOB-IMG None  12/08/2023  8:55 AM [place], Irving Burton, CNM AOB-AOB None   For next visit:  continue with routine prenatal care    Julieanne Manson, DO [facility]

## 2023-11-10 NOTE — Patient Instructions (Signed)
 Rh0 [D] Immune Globulin injection What is this medication? RhO [D] IMMUNE GLOBULIN (i MYOON  GLOB yoo lin) is used to treat idiopathic thrombocytopenic purpura (ITP). This medicine is used in RhO negative mothers who are pregnant with a RhO positive child. It is also used after a transfusion of RhO positive blood into a RhO negative person. This medicine may be used for other purposes; ask your health care provider or pharmacist if you have questions. This medicine may be used for other purposes; ask your health care provider or pharmacist if you have questions. COMMON BRAND NAME(S): BayRho-D, HyperRHO S/D, MICRhoGAM, RhoGAM, Rhophylac, WinRho SDF What should I tell my care team before I take this medication? They need to know if you have any of these conditions: bleeding disorders low levels of immunoglobulin A in the body no spleen an unusual or allergic reaction to human immune globulin, other medicines, foods, dyes, or preservatives pregnant or trying to get pregnant breast-feeding How should I use this medication? This medicine is for injection into a muscle or into a vein. It is given by a health care professional in a hospital or clinic setting. Talk to your pediatrician regarding the use of this medicine in children. This medicine is not approved for use in children. Overdosage: If you think you have taken too much of this medicine contact a poison control center or emergency room at once. NOTE: This medicine is only for you. Do not share this medicine with others. Overdosage: If you think you have taken too much of this medicine contact a poison control center or emergency room at once. NOTE: This medicine is only for you. Do not share this medicine with others. What if I miss a dose? It is important not to miss your dose. Call your doctor or health care professional if you are unable to keep an appointment. What may interact with this medication? live virus vaccines, like measles,  mumps, or rubella This list may not describe all possible interactions. Give your health care provider a list of all the medicines, herbs, non-prescription drugs, or dietary supplements you use. Also tell them if you smoke, drink alcohol, or use illegal drugs. Some items may interact with your medicine. This list may not describe all possible interactions. Give your health care provider a list of all the medicines, herbs, non-prescription drugs, or dietary supplements you use. Also tell them if you smoke, drink alcohol, or use illegal drugs. Some items may interact with your medicine. What should I watch for while using this medication? This medicine is made from human blood. It may be possible to pass an infection in this medicine. Talk to your doctor about the risks and benefits of this medicine. This medicine may interfere with live virus vaccines. Before you get live virus vaccines tell your health care professional if you have received this medicine within the past 3 months. What side effects may I notice from receiving this medication? Side effects that you should report to your doctor or health care professional as soon as possible: allergic reactions like skin rash, itching or hives, swelling of the face, lips, or tongue breathing problems chest pain or tightness yellowing of the eyes or skin Side effects that usually do not require medical attention (report to your doctor or health care professional if they continue or are bothersome): fever pain and tenderness at site where injected This list may not describe all possible side effects. Call your doctor for medical advice about side effects. You may  report side effects to FDA at 1-800-FDA-1088. This list may not describe all possible side effects. Call your doctor for medical advice about side effects. You may report side effects to FDA at 1-800-FDA-1088. Where should I keep my medication? This drug is given in a hospital or clinic and  will not be stored at home. NOTE: This sheet is a summary. It may not cover all possible information. If you have questions about this medicine, talk to your doctor, pharmacist, or health care provider.  2024 Elsevier/Gold Standard (2008-04-28 00:00:00)

## 2023-11-11 ENCOUNTER — Encounter: Payer: Self-pay | Admitting: Obstetrics

## 2023-11-11 LAB — 28 WEEKS RH-PANEL
Antibody Screen: NEGATIVE
Basophils Absolute: 0 10*3/uL (ref 0.0–0.2)
Basos: 1 %
EOS (ABSOLUTE): 0.1 10*3/uL (ref 0.0–0.4)
Eos: 2 %
Gestational Diabetes Screen: 95 mg/dL (ref 70–139)
HIV Screen 4th Generation wRfx: NONREACTIVE
Hematocrit: 35.7 % (ref 34.0–46.6)
Hemoglobin: 12.2 g/dL (ref 11.1–15.9)
Immature Grans (Abs): 0.1 10*3/uL (ref 0.0–0.1)
Immature Granulocytes: 1 %
Lymphocytes Absolute: 1.7 10*3/uL (ref 0.7–3.1)
Lymphs: 21 %
MCH: 33.2 pg — ABNORMAL HIGH (ref 26.6–33.0)
MCHC: 34.2 g/dL (ref 31.5–35.7)
MCV: 97 fL (ref 79–97)
Monocytes Absolute: 0.6 10*3/uL (ref 0.1–0.9)
Monocytes: 7 %
Neutrophils Absolute: 5.4 10*3/uL (ref 1.4–7.0)
Neutrophils: 68 %
Platelets: 175 10*3/uL (ref 150–450)
RBC: 3.67 x10E6/uL — ABNORMAL LOW (ref 3.77–5.28)
RDW: 12.5 % (ref 11.7–15.4)
RPR Ser Ql: NONREACTIVE
WBC: 7.8 10*3/uL (ref 3.4–10.8)

## 2023-11-25 ENCOUNTER — Ambulatory Visit: Admitting: Obstetrics

## 2023-11-25 VITALS — BP 116/66 | HR 101 | Wt 238.0 lb

## 2023-11-25 DIAGNOSIS — O0990 Supervision of high risk pregnancy, unspecified, unspecified trimester: Secondary | ICD-10-CM

## 2023-11-25 DIAGNOSIS — Z3A30 30 weeks gestation of pregnancy: Secondary | ICD-10-CM

## 2023-11-25 DIAGNOSIS — O34219 Maternal care for unspecified type scar from previous cesarean delivery: Secondary | ICD-10-CM

## 2023-11-25 DIAGNOSIS — O09523 Supervision of elderly multigravida, third trimester: Secondary | ICD-10-CM | POA: Diagnosis not present

## 2023-11-25 DIAGNOSIS — O10013 Pre-existing essential hypertension complicating pregnancy, third trimester: Secondary | ICD-10-CM

## 2023-11-25 DIAGNOSIS — O99213 Obesity complicating pregnancy, third trimester: Secondary | ICD-10-CM

## 2023-11-25 DIAGNOSIS — E66813 Obesity, class 3: Secondary | ICD-10-CM

## 2023-11-25 DIAGNOSIS — O10919 Unspecified pre-existing hypertension complicating pregnancy, unspecified trimester: Secondary | ICD-10-CM

## 2023-11-25 NOTE — Patient Instructions (Addendum)
 Here are the links to childbirth classes and the hospital tour:  [email or web address].co.[email or web address]  [email or web address]   Here is the link to info about having your tubes "tied" at the time of your c-section: [email or web address]

## 2023-11-25 NOTE — Progress Notes (Signed)
    Return Prenatal Note   Subjective  43 y.o. [id number] at [redacted]w[redacted]d presents for this follow-up prenatal visit. Pregnancy notable for chronic hypertension not on medications, BMI, AMA, hx of gastric sleeve, hx of macrosomia in prior 2 pregnancies, and prior CD x 2 desiring TOLAC.   cHTN: BP in range and asymptomatic. Most recent growth on 4/4 at 26.2 showed EFW 82%ile, AC 89%, AFI 19.4.   Patient has questions on breast feeding and labor classes. Also wanting to discuss having a rCD instead of TOLAC. She has some concerns about the risks of TOLAC after reviewing her operative reports.   Patient reports: Movement: Present Contractions: Not present Denies vaginal bleeding or leaking fluid. Objective  Flow sheet Vitals: Pulse Rate: (!) 101 BP: 116/66 Fundal Height: 31 cm Fetal Heart Rate (bpm): 130 Total weight gain: 30 lb (13.6 kg)  General Appearance  No acute distress, well appearing, and well nourished Pulmonary   Normal work of breathing Neurologic   Alert and oriented to person, place, and time Psychiatric   Mood and affect within normal limits   Assessment/Plan   Plan  43 y.o. [id number] at [redacted]w[redacted]d by LMP=9wk US  presents for follow-up OB visit. Reviewed prenatal record including previous visit note.  1. Supervision of high risk pregnancy, antepartum (Primary) -CBE link in AVS  2. HTN in pregnancy, chronic -Stable, no anti-HTN medications -Most recent EFW at 26wks = 82%ile -Continue Q4wk growth US , next is tomorrow, 5/2  3. AMA -NIPT negative, anatomy US  without gross anomalies -Is getting growth US  for cHTN already -Start weekly NSTs at 34wks  4. Obesity, Class III, BMI 40-49.9 (morbid obesity) (HCC) -BMI 43.5 today -Is getting growth US  for cHTN already -Start weekly NSTs at 34wks -Anesthesia consult if BMI >=45  5. Hx of prior CD x 2 -Discussion today re: TOLAC and pt concerned by her "extremely thin" LUS mentioned in her operative report of last cesarean. We  discussed findings from last CD, coupled with her hx of macrosomia, and VBAC calculator predicting 39.4% success; after much discussion, pt now desires rCD w/ON-Q.  -Scheduled for 01/19/24; considering post-partum BTL, ACOG link in AVS   Return in about 2 weeks (around 12/09/2023) for ROB with Dayne Dekay .   Future Appointments  Date Time Provider Department Center  11/26/2023 10:15 AM AOB-AOB US  1 AOB-IMG None  12/08/2023  1:55 PM [name], MD AOB-AOB None    For next visit:  continue with routine prenatal care    [name], DO [facility]

## 2023-11-26 ENCOUNTER — Other Ambulatory Visit: Payer: Self-pay | Admitting: Obstetrics

## 2023-11-26 ENCOUNTER — Telehealth: Payer: Self-pay

## 2023-11-26 ENCOUNTER — Ambulatory Visit

## 2023-11-26 DIAGNOSIS — Z3A31 31 weeks gestation of pregnancy: Secondary | ICD-10-CM

## 2023-11-26 DIAGNOSIS — O99213 Obesity complicating pregnancy, third trimester: Secondary | ICD-10-CM

## 2023-11-26 NOTE — Telephone Encounter (Signed)
 Patient LVM on triage line stating yesterday and this morning noticing "blood droplets" in her underwear. She stated no pain and that the blood was not bright red.   Spoke with patient. She states darker red to light pink colored small circles on her underwear. Denies blood when wiping or in toilet. Denies pain or pelvic cramping and any UTI symptoms. Reports not sexually active since last week. Fetal movement is good this morning. Patient is scheduled for an ultrasound this morning as well.  Advised if bleeding, cramping or pain becomes worsened to go to ED for further work up.

## 2023-11-29 DIAGNOSIS — R946 Abnormal results of thyroid function studies: Secondary | ICD-10-CM | POA: Diagnosis not present

## 2023-11-29 DIAGNOSIS — Z3A30 30 weeks gestation of pregnancy: Secondary | ICD-10-CM | POA: Diagnosis not present

## 2023-12-01 ENCOUNTER — Encounter: Payer: Self-pay | Admitting: Obstetrics and Gynecology

## 2023-12-01 ENCOUNTER — Observation Stay
Admission: EM | Admit: 2023-12-01 | Discharge: 2023-12-01 | Disposition: A | Discharge status: Home / Self Care | Attending: Obstetrics and Gynecology | Admitting: Obstetrics and Gynecology

## 2023-12-01 ENCOUNTER — Other Ambulatory Visit: Payer: Self-pay

## 2023-12-01 ENCOUNTER — Encounter: Payer: Self-pay | Admitting: Obstetrics

## 2023-12-01 DIAGNOSIS — Z3A31 31 weeks gestation of pregnancy: Secondary | ICD-10-CM | POA: Insufficient documentation

## 2023-12-01 DIAGNOSIS — O0990 Supervision of high risk pregnancy, unspecified, unspecified trimester: Principal | ICD-10-CM

## 2023-12-01 DIAGNOSIS — O368190 Decreased fetal movements, unspecified trimester, not applicable or unspecified: Secondary | ICD-10-CM | POA: Diagnosis present

## 2023-12-01 DIAGNOSIS — O368130 Decreased fetal movements, third trimester, not applicable or unspecified: Secondary | ICD-10-CM | POA: Diagnosis not present

## 2023-12-01 DIAGNOSIS — O09523 Supervision of elderly multigravida, third trimester: Secondary | ICD-10-CM | POA: Diagnosis not present

## 2023-12-01 NOTE — OB Triage Note (Signed)
 LABOR & DELIVERY OB TRIAGE NOTE  SUBJECTIVE  HPI Lauren [name] is a 43 y.o. Z6X0960 at [redacted]w[redacted]d who presents to Labor & Delivery for monitoring after reporting decreased fetal movement.   Patient reports feeling less movement than she normally notices overnight and throughout the morning. She denies contractions, LOF, and vaginal bleeding.   OB History     Gravida  4   Para  2   Term  2   Preterm      AB  1   Living  2      SAB  1   IAB      Ectopic      Multiple      Live Births  2            OBJECTIVE  BP 123/77 (BP Location: Left Arm)   Pulse (!) 108   Temp 98.4 F (36.9 C) (Oral)   Resp 15   Ht 5\' 2"  (1.575 m)   Wt 108 kg   LMP 04/28/2023 (Exact Date)   BMI 43.53 kg/m   General: AOx3 Heart: tachycardic Lungs: normal work of breathing Abdomen: gravid, soft, non-tender Cervical exam:  deferred  NST I reviewed the NST and it was reactive.  Baseline: 135 Variability: moderate Accelerations: greater than 2 15x15 bpm Decelerations:none Toco: quiet Category I  ASSESSMENT Impression  1) Pregnancy at A5W0981, [redacted]w[redacted]d, Estimated Date of Delivery: 02/02/24 2) Reassuring maternal/fetal status  PLAN 1) Provided reassurance regarding fetal status. Reviewed changes in quality of fetal movements as pregnancy progresses and baby gets larger. Patient endorses feeling fetal movement while in OB triage.  2) Continue with routine prenatal care.   Verita Glassman Lucille Witts, CNM  12/01/23  2:55 PM

## 2023-12-01 NOTE — OB Triage Note (Signed)
 Patient is a Z6X0960 at [redacted]w[redacted]d who presents to unit c/o decreased fteal movement since yesterday. Denies contractions, vaginal bleeding, and leakage of fluid. External monitors applied and assessing. Initial FHT 150. Vital signs WDL.

## 2023-12-01 NOTE — OB Triage Note (Signed)
 Discharge instructions, labor precautions, and follow-up care reviewed with patient and significant other. All questions answered. Patient verbalized understanding. Discharged ambulatory off unit.

## 2023-12-08 ENCOUNTER — Ambulatory Visit (INDEPENDENT_AMBULATORY_CARE_PROVIDER_SITE_OTHER): Admitting: Obstetrics

## 2023-12-08 ENCOUNTER — Encounter: Admitting: Certified Nurse Midwife

## 2023-12-08 VITALS — BP 127/68 | HR 104 | Wt 237.0 lb

## 2023-12-08 DIAGNOSIS — O99213 Obesity complicating pregnancy, third trimester: Secondary | ICD-10-CM | POA: Diagnosis not present

## 2023-12-08 DIAGNOSIS — O09523 Supervision of elderly multigravida, third trimester: Secondary | ICD-10-CM

## 2023-12-08 DIAGNOSIS — E66813 Obesity, class 3: Secondary | ICD-10-CM

## 2023-12-08 DIAGNOSIS — O34219 Maternal care for unspecified type scar from previous cesarean delivery: Secondary | ICD-10-CM

## 2023-12-08 DIAGNOSIS — Z3A32 32 weeks gestation of pregnancy: Secondary | ICD-10-CM

## 2023-12-08 DIAGNOSIS — O0990 Supervision of high risk pregnancy, unspecified, unspecified trimester: Secondary | ICD-10-CM

## 2023-12-08 DIAGNOSIS — O10919 Unspecified pre-existing hypertension complicating pregnancy, unspecified trimester: Secondary | ICD-10-CM

## 2023-12-08 DIAGNOSIS — O10013 Pre-existing essential hypertension complicating pregnancy, third trimester: Secondary | ICD-10-CM | POA: Diagnosis not present

## 2023-12-08 NOTE — Progress Notes (Signed)
    Return Prenatal Note   Subjective  43 y.o. [id number] at [redacted]w[redacted]d presents for this follow-up prenatal visit. Pregnancy notable for chronic hypertension not on medications, BMI, AMA, hx of gastric sleeve, hx of macrosomia in prior 2 pregnancies, and prior CD x 2 desiring repeat.   cHTN: BP in range and asymptomatic. Most recent growth on 5/2 at 30w showed EFW 63%ile, AC 72%, AFI 24.8.   Is unsure about BTL at time of c-section.   Patient reports: Movement: Present Contractions: Not present Denies vaginal bleeding or leaking fluid. Objective  Flow sheet Vitals: Pulse Rate: (!) 104 BP: 127/68 Total weight gain: 29 lb (13.2 kg)  General Appearance  No acute distress, well appearing, and well nourished Pulmonary   Normal work of breathing Neurologic   Alert and oriented to person, place, and time Psychiatric   Mood and affect within normal limits   Assessment/Plan   Plan  43 y.o. [id number] at [redacted]w[redacted]d by LMP=9wk US  presents for follow-up OB visit. Reviewed prenatal record including previous visit note.  1. Supervision of high risk pregnancy, antepartum (Primary) -Discussed BTL and pt is not 100%, advised to not have done if not 100% sure; aware can request up to the day of surgery.   2. HTN in pregnancy, chronic -Stable, no anti-HTN medications -Most recent EFW at 30wks = 63%ile -Continue Q4wk growth US , next at 34wks, ordered  3. AMA -NIPT negative, anatomy US  without gross anomalies -Is getting growth US  for cHTN already -Start weekly NSTs at 34wks  4. Obesity, Class III, BMI 40-49.9 (morbid obesity) (HCC) -BMI 43.35 today -Is getting growth US  for cHTN already -Start weekly NSTs at 34wks -Anesthesia consult if BMI >=45  5. Hx of prior CD x 2 -rCD scheduled for 01/19/24;   Return in about 2 weeks (around 12/22/2023) for ROB w/NST.   Future Appointments  Date Time Provider Department Center  12/21/2023  1:15 PM [facility]-NST ROOM [facility] None  12/21/2023  1:55 PM [name],  MD [facility] None  01/11/2024  8:00 AM [facility] None    For next visit:  continue with routine prenatal care    [name], DO [facility] [place]

## 2023-12-15 ENCOUNTER — Encounter: Payer: Self-pay | Admitting: Obstetrics

## 2023-12-21 ENCOUNTER — Other Ambulatory Visit

## 2023-12-21 ENCOUNTER — Ambulatory Visit (INDEPENDENT_AMBULATORY_CARE_PROVIDER_SITE_OTHER): Admitting: Obstetrics

## 2023-12-21 VITALS — BP 121/71 | HR 107 | Wt 239.0 lb

## 2023-12-21 DIAGNOSIS — O09523 Supervision of elderly multigravida, third trimester: Secondary | ICD-10-CM | POA: Diagnosis not present

## 2023-12-21 DIAGNOSIS — Z3A33 33 weeks gestation of pregnancy: Secondary | ICD-10-CM | POA: Insufficient documentation

## 2023-12-21 DIAGNOSIS — O99213 Obesity complicating pregnancy, third trimester: Secondary | ICD-10-CM | POA: Diagnosis not present

## 2023-12-21 DIAGNOSIS — E669 Obesity, unspecified: Secondary | ICD-10-CM

## 2023-12-21 DIAGNOSIS — O10013 Pre-existing essential hypertension complicating pregnancy, third trimester: Secondary | ICD-10-CM | POA: Diagnosis not present

## 2023-12-21 NOTE — Progress Notes (Signed)
    Return Prenatal Note   Subjective  43 y.o. [id number] at [redacted]w[redacted]d presents for this follow-up prenatal visit. Pregnancy notable for chronic hypertension not on medications, BMI, AMA, hx of gastric sleeve, hx of macrosomia in prior 2 pregnancies, and prior CD x 2 desiring repeat.   Pt here with her daughter today, questions about the logistics of delivery day and visitors.   cHTN: BP in range and asymptomatic. Most recent growth on 5/2 at 30w showed EFW 63%ile, AC 72%, AFI 24.8.   Is still unsure about BTL at time of c-section, but leaning more toward doing.   Patient reports: Movement: Present Contractions: Not present Denies vaginal bleeding or leaking fluid. Objective  Flow sheet Vitals: Pulse Rate: (!) 107 BP: 121/71 Total weight gain: 31 lb (14.1 kg)  General Appearance  No acute distress, well appearing, and well nourished Pulmonary   Normal work of breathing Neurologic   Alert and oriented to person, place, and time Psychiatric   Mood and affect within normal limits  Lauren [name] [birth year] 43y.o.  Fetus A Non-Stress Test Interpretation for 12/21/23  Indication: Chronic Hypertenstion, Advanced Maternal Age >40 years, and BMI  Fetal Heart Rate A Mode: External Baseline Rate (A): 135 bpm Variability: Moderate Accelerations: 15 x 15 Decelerations: None Scalp Stimulation: Negative  Uterine Activity Mode: Toco Contraction Frequency (min): None  Interpretation (Fetal Testing) Nonstress Test Interpretation: Reactive Overall Impression: Reassuring for gestational age   Assessment/Plan   Plan  43 y.o. [id number] at [redacted]w[redacted]d by LMP=9wk US  presents for follow-up OB visit. Reviewed prenatal record including previous visit note.  1. Supervision of high risk pregnancy, antepartum (Primary) -NST today reactive -Discussed BTL and pt is not 100%, advised to not have done if not 100% sure; aware can request up to the day of surgery.  -Discussed pt's concerns about  people in OR vs LDR room, we can facilitate them leaving at certain times if she is uncomfortable doing so.   2. HTN in pregnancy, chronic -Stable, no anti-HTN medications -Most recent EFW at 30wks = 63%ile -Continue Q4wk growth US , next sched 12/24/23  3. AMA -NIPT negative, anatomy US  without gross anomalies -Is getting growth US  for cHTN already -Weekly NSTs at 34wks  4. Obesity, Class III, BMI 40-49.9 (morbid obesity) (HCC) -BMI 43.71 today -Is getting growth US  for cHTN already -Weekly NSTs at 34wks -Anesthesia consult if BMI >=45  5. Hx of prior CD x 2 -rCD scheduled for 01/19/24  Return in about 1 week (around 12/28/2023) for NST; 2wks for ROB/NST.   Future Appointments  Date Time Provider Department Center  12/24/2023 11:00 AM [facility]  12/29/2023  9:15 AM [facility]OB-NST ROOM [facility] None  01/05/2024  8:15 AM AOB-NST ROOM [facility] None  01/05/2024  8:35 AM Lauren Dunn, MD [facility] None  01/11/2024  8:00 AM [facility] PAT[facility] None    For next visit:  continue with routine prenatal care    Lauren Dunn, DO [facility] [place]

## 2023-12-21 NOTE — Patient Instructions (Signed)

## 2023-12-24 ENCOUNTER — Ambulatory Visit
Admission: RE | Admit: 2023-12-24 | Discharge: 2023-12-24 | Disposition: A | Discharge status: Home / Self Care | Source: Ambulatory Visit | Attending: Obstetrics | Admitting: Obstetrics

## 2023-12-24 DIAGNOSIS — E66813 Obesity, class 3: Secondary | ICD-10-CM | POA: Diagnosis not present

## 2023-12-24 DIAGNOSIS — O10919 Unspecified pre-existing hypertension complicating pregnancy, unspecified trimester: Secondary | ICD-10-CM | POA: Diagnosis not present

## 2023-12-24 DIAGNOSIS — O09513 Supervision of elderly primigravida, third trimester: Secondary | ICD-10-CM | POA: Diagnosis not present

## 2023-12-24 DIAGNOSIS — O09523 Supervision of elderly multigravida, third trimester: Secondary | ICD-10-CM

## 2023-12-24 DIAGNOSIS — Z3A34 34 weeks gestation of pregnancy: Secondary | ICD-10-CM | POA: Diagnosis not present

## 2023-12-29 ENCOUNTER — Ambulatory Visit (INDEPENDENT_AMBULATORY_CARE_PROVIDER_SITE_OTHER)

## 2023-12-29 VITALS — BP 103/68 | HR 92 | Ht 62.0 in | Wt 243.4 lb

## 2023-12-29 DIAGNOSIS — E669 Obesity, unspecified: Secondary | ICD-10-CM | POA: Diagnosis not present

## 2023-12-29 DIAGNOSIS — O10919 Unspecified pre-existing hypertension complicating pregnancy, unspecified trimester: Secondary | ICD-10-CM | POA: Insufficient documentation

## 2023-12-29 DIAGNOSIS — O09523 Supervision of elderly multigravida, third trimester: Secondary | ICD-10-CM

## 2023-12-29 DIAGNOSIS — O10013 Pre-existing essential hypertension complicating pregnancy, third trimester: Secondary | ICD-10-CM

## 2023-12-29 DIAGNOSIS — Z3A35 35 weeks gestation of pregnancy: Secondary | ICD-10-CM | POA: Diagnosis not present

## 2023-12-29 DIAGNOSIS — O10913 Unspecified pre-existing hypertension complicating pregnancy, third trimester: Secondary | ICD-10-CM

## 2023-12-29 DIAGNOSIS — O99213 Obesity complicating pregnancy, third trimester: Secondary | ICD-10-CM | POA: Diagnosis not present

## 2023-12-29 HISTORY — DX: Unspecified pre-existing hypertension complicating pregnancy, unspecified trimester: O10.919

## 2023-12-29 NOTE — Patient Instructions (Signed)
 Nonstress Test: What to Expect A nonstress test, also called an NST, is done during pregnancy to check your baby's heartbeat. The procedure can help to show if your baby is healthy. It may be done if: Your due date has passed. Your pregnancy is high risk. Your baby is moving less than normal. You've lost a previous pregnancy. Your baby is growing slowly. There's too much or too little fluid around your baby. The NST may be done in the third trimester to find out if it's best for your baby to be born early. During an NST, your baby's heartbeat is watched for at least 20 minutes. If the baby is healthy, the heart rate will go up when the baby moves and will return to normal when the baby rests. This should happen at least twice during the test. Tell a health care provider about: Any allergies you have. Any medical problems you have. All medicines you take. These include vitamins, herbs, eye drops, and creams. Any surgeries you've had. Any past pregnancies you've had. What are the risks? There are no risks to you or your baby from a nonstress test. This procedure shouldn't be painful or uncomfortable. What happens before? Eat a meal right before the test or as told by your health care team. Food may help the baby to move. Use the restroom right before the test. What happens during a nonstress test?  Two monitors will be placed on your belly. One will check your baby's heart rate, and the other will check for contractions. You may be asked to lie down on your side or to sit up. You may be given a button to press when you feel your baby move. If your baby seems to be sleeping, you may be asked to drink some juice or soda, eat a snack, or change positions. These steps may vary. Ask what you can expect. What can I expect after? Your team will talk with you about the results and tell you the next steps. If your team gave you any diet or activity instructions, make sure to follow them. Keep all  follow-up visits. This is important to check on your health and the health of your baby. This information is not intended to replace advice given to you by your health care provider. Make sure you discuss any questions you have with your health care provider. Document Revised: 07/08/2023 Document Reviewed: 07/08/2023 Elsevier Patient Education  2025 [employer].

## 2023-12-29 NOTE — Progress Notes (Signed)
    NURSE VISIT NOTE  Subjective:    Patient ID: Lauren [name], female    DOB: 07-Jul-1981, 43 y.o.   MRN: [id number]  HPI  Patient is a 43 y.o. [id number] female who presents for fetal monitoring per order from [name], MD.   Objective:    BP 103/68   Pulse 92   Ht 5\' 2"  (1.575 m)   Wt 243 lb 6.4 oz (110.4 kg)   LMP 04/28/2023 (Exact Date)   BMI 44.52 kg/m  Estimated Date of Delivery: 02/02/24  [redacted]w[redacted]d  Fetus A Non-Stress Test Interpretation for 12/29/23  Indication: Advanced Maternal Age >40 years, Chronic Hypertenstion, and Obesity  Fetal Heart Rate A Mode: External Baseline Rate (A): 130 bpm Variability: Moderate Accelerations: 15 x 15 Decelerations: None Scalp Stimulation: Negative Multiple birth?: No  Uterine Activity Mode: Toco Contraction Frequency (min): None  Interpretation (Fetal Testing) Nonstress Test Interpretation: Reactive Overall Impression: Reassuring for gestational age   Assessment:   1. AMA (advanced maternal age) multigravida 35+, third trimester   2. Maternal chronic hypertension, third trimester   3. Obesity affecting pregnancy in third trimester, unspecified obesity type   4. [redacted] weeks gestation of pregnancy      Plan:   Results reviewed by [name], MD and discussed with patient.     Desmond Florida, LPN

## 2024-01-04 ENCOUNTER — Encounter: Payer: Self-pay | Admitting: Obstetrics

## 2024-01-05 ENCOUNTER — Ambulatory Visit: Admitting: Obstetrics

## 2024-01-05 ENCOUNTER — Other Ambulatory Visit

## 2024-01-05 VITALS — BP 115/80 | HR 105 | Wt 240.0 lb

## 2024-01-05 DIAGNOSIS — O0993 Supervision of high risk pregnancy, unspecified, third trimester: Secondary | ICD-10-CM

## 2024-01-05 DIAGNOSIS — O09523 Supervision of elderly multigravida, third trimester: Secondary | ICD-10-CM | POA: Diagnosis not present

## 2024-01-05 DIAGNOSIS — O34219 Maternal care for unspecified type scar from previous cesarean delivery: Secondary | ICD-10-CM | POA: Diagnosis not present

## 2024-01-05 DIAGNOSIS — Z3A36 36 weeks gestation of pregnancy: Secondary | ICD-10-CM

## 2024-01-05 DIAGNOSIS — Z3685 Encounter for antenatal screening for Streptococcus B: Secondary | ICD-10-CM

## 2024-01-05 DIAGNOSIS — O288 Other abnormal findings on antenatal screening of mother: Secondary | ICD-10-CM

## 2024-01-05 DIAGNOSIS — Z113 Encounter for screening for infections with a predominantly sexual mode of transmission: Secondary | ICD-10-CM

## 2024-01-05 DIAGNOSIS — O99213 Obesity complicating pregnancy, third trimester: Secondary | ICD-10-CM | POA: Diagnosis not present

## 2024-01-05 DIAGNOSIS — O10913 Unspecified pre-existing hypertension complicating pregnancy, third trimester: Secondary | ICD-10-CM

## 2024-01-05 DIAGNOSIS — O10013 Pre-existing essential hypertension complicating pregnancy, third trimester: Secondary | ICD-10-CM

## 2024-01-05 DIAGNOSIS — E669 Obesity, unspecified: Secondary | ICD-10-CM | POA: Diagnosis not present

## 2024-01-05 NOTE — Patient Instructions (Signed)
 Nonstress Test: What to Expect A nonstress test, also called an NST, is done during pregnancy to check your baby's heartbeat. The procedure can help to show if your baby is healthy. It may be done if: Your due date has passed. Your pregnancy is high risk. Your baby is moving less than normal. You've lost a previous pregnancy. Your baby is growing slowly. There's too much or too little fluid around your baby. The NST may be done in the third trimester to find out if it's best for your baby to be born early. During an NST, your baby's heartbeat is watched for at least 20 minutes. If the baby is healthy, the heart rate will go up when the baby moves and will return to normal when the baby rests. This should happen at least twice during the test. Tell a health care provider about: Any allergies you have. Any medical problems you have. All medicines you take. These include vitamins, herbs, eye drops, and creams. Any surgeries you've had. Any past pregnancies you've had. What are the risks? There are no risks to you or your baby from a nonstress test. This procedure shouldn't be painful or uncomfortable. What happens before? Eat a meal right before the test or as told by your health care team. Food may help the baby to move. Use the restroom right before the test. What happens during a nonstress test?  Two monitors will be placed on your belly. One will check your baby's heart rate, and the other will check for contractions. You may be asked to lie down on your side or to sit up. You may be given a button to press when you feel your baby move. If your baby seems to be sleeping, you may be asked to drink some juice or soda, eat a snack, or change positions. These steps may vary. Ask what you can expect. What can I expect after? Your team will talk with you about the results and tell you the next steps. If your team gave you any diet or activity instructions, make sure to follow them. Keep all  follow-up visits. This is important to check on your health and the health of your baby. This information is not intended to replace advice given to you by your health care provider. Make sure you discuss any questions you have with your health care provider. Document Revised: 07/08/2023 Document Reviewed: 07/08/2023 Elsevier Patient Education  2025 [employer].

## 2024-01-05 NOTE — Progress Notes (Signed)
    Return Prenatal Note   Subjective  43 y.o. [id number] at [redacted]w[redacted]d presents for this follow-up prenatal visit. Pregnancy notable for chronic hypertension not on medications, BMI, AMA, hx of gastric sleeve, hx of macrosomia in prior 2 pregnancies, and prior CD x 2 desiring repeat.   cHTN: BP in range and asymptomatic. Most recent growth on 5/30 at 34w showed EFW 47%ile, MVP 9.6.   Is still unsure about BTL at time of c-section, but leaning more toward doing. Signed consent.   Patient reports: Movement: Present Contractions: Not present Denies vaginal bleeding or leaking fluid. Objective  Flow sheet Vitals: Pulse Rate: (!) 105 BP: 115/80 Total weight gain: 32 lb (14.5 kg)  General Appearance  No acute distress, well appearing, and well nourished Pulmonary   Normal work of breathing Neurologic   Alert and oriented to person, place, and time Psychiatric   Mood and affect within normal limits  Fetus A Non-Stress Test Interpretation for 01/05/24  Indication: Chronic Hypertenstion, Advanced Maternal Age >40 years, and BMI  Fetal Heart Rate A Mode: External Baseline Rate (A): 130 bpm Variability: Moderate Accelerations: 10 x 10, 15 x 15 Decelerations: None Scalp Stimulation: Negative Multiple birth?: No  Uterine Activity Mode: Toco Contraction Frequency (min): None  Interpretation (Fetal Testing) Nonstress Test Interpretation: Non-reactive Overall Impression: Non-reassuring   Assessment/Plan   Plan  43 y.o. [id number] at [redacted]w[redacted]d by LMP=9wk US  presents for follow-up OB visit. Reviewed prenatal record including previous visit note.  1. Supervision of high risk pregnancy, antepartum (Primary) -NST today non-reactive -- BPP done in office and 8/8 -Discussed BTL and pt is not 100%, advised to not have done if not 100% sure; aware can request up to the day of surgery.   2. HTN in pregnancy, chronic -Stable, no anti-HTN medications -Most recent EFW at 34wks = 47%ile -Weekly  monitoring until delivery  3. AMA -NIPT negative, anatomy US  without gross anomalies -Weekly NSTs at 34wks  4. Obesity, Class III, BMI 40-49.9 (morbid obesity) (HCC) -BMI 43.90 today -Weekly NSTs at 34wks -Anesthesia consult if BMI >=45  5. Hx of prior CD x 2 -rCD scheduled for 01/19/24    Future Appointments  Date Time Provider Department Center  01/11/2024  8:00 AM [facility] PAT[facility] None  01/12/2024  8:00 AM [facility]-NST ROOM [facility] None  01/12/2024  8:15 AM Phylliss Brenner, CNM [facility] None  01/26/2024  1:15 PM [name], MD [facility] None    For next visit:  continue with routine prenatal care    [name], DO [facility] [place]

## 2024-01-06 LAB — CERVICOVAGINAL ANCILLARY ONLY
Chlamydia: NEGATIVE
Comment: NEGATIVE
Comment: NORMAL
Neisseria Gonorrhea: NEGATIVE

## 2024-01-08 ENCOUNTER — Observation Stay
Admission: EM | Admit: 2024-01-08 | Discharge: 2024-01-08 | Disposition: A | Discharge status: Home / Self Care | Attending: Certified Nurse Midwife | Admitting: Certified Nurse Midwife

## 2024-01-08 DIAGNOSIS — O368130 Decreased fetal movements, third trimester, not applicable or unspecified: Principal | ICD-10-CM | POA: Insufficient documentation

## 2024-01-08 DIAGNOSIS — Z3A36 36 weeks gestation of pregnancy: Secondary | ICD-10-CM | POA: Insufficient documentation

## 2024-01-08 DIAGNOSIS — O0990 Supervision of high risk pregnancy, unspecified, unspecified trimester: Principal | ICD-10-CM

## 2024-01-08 NOTE — OB Triage Note (Signed)
 Patient here for decreased fetal movement. States that baby is moving just not very much. She failed her NST on Wednesday at office but baby was fine on ultrasound. She is just being cautious. Patient reports feeling movement as soon as monitors placed on her abdomen. She was nauseated this morning but it has subsided. Reports  no contractions but some discharge which she mentioned to dr Dell Fennel at her last appointment. Monitors applied will monitor. Midwife and MD aware of patients arrival.

## 2024-01-08 NOTE — OB Triage Note (Signed)
 LABOR & DELIVERY OB TRIAGE NOTE  SUBJECTIVE  HPI Lauren [name] is a 43 y.o. [id number] at [redacted]w[redacted]d who presents to Labor & Delivery for decreased fetal movement. States that baby has been moving today but not as much. Denies contractions, LOF or vaginal bleeding.   OB History     Gravida  4   Para  2   Term  2   Preterm      AB  1   Living  2      SAB  1   IAB      Ectopic      Multiple      Live Births  2            OBJECTIVE  LMP 04/28/2023 (Exact Date)   General: A&O x 3 Lungs: normal work of breathing Abdomen: gravid, soft, non tender Cervical exam:   deferred  NST I reviewed the NST and it was reactive.  Baseline: 125 Variability: moderate Accelerations:15x15 Decelerations:none Toco: irregular Category 1  ASSESSMENT Impression  1) Pregnancy at [id number], [redacted]w[redacted]d, Estimated Date of Delivery: 02/02/24 2) Reassuring maternal/fetal status 3)Feeling movement while in triage  PLAN 1)Discharge home in stable condition. 2)Instructed to call office or come to hospital with persistent headache, vision changes, regular contractions, leaking of fluid, decreased fetal movement or vaginal bleeding.    Donato Fu, CNM  01/08/24  8:57 PM

## 2024-01-11 ENCOUNTER — Other Ambulatory Visit: Payer: Self-pay

## 2024-01-11 ENCOUNTER — Encounter
Admission: RE | Admit: 2024-01-11 | Discharge: 2024-01-11 | Disposition: A | Discharge status: Home / Self Care | Source: Ambulatory Visit | Attending: Obstetrics | Admitting: Obstetrics

## 2024-01-11 DIAGNOSIS — I10 Essential (primary) hypertension: Secondary | ICD-10-CM

## 2024-01-11 HISTORY — DX: Essential (primary) hypertension: I10

## 2024-01-11 HISTORY — DX: Other specified postprocedural states: R11.2

## 2024-01-11 HISTORY — DX: Malignant (primary) neoplasm, unspecified: C80.1

## 2024-01-11 HISTORY — DX: Headache, unspecified: R51.9

## 2024-01-11 HISTORY — DX: Other specified postprocedural states: Z98.890

## 2024-01-11 HISTORY — DX: Anemia, unspecified: D64.9

## 2024-01-11 NOTE — Patient Instructions (Addendum)
 Your procedure is scheduled on: 01/19/24 - Wednesday  Arrival Time: Please call Labor and Delivery the day before your scheduled C-Section to find out your arrival time. [phone].  Arrival: If your arrival time is prior to 6:00 am, please enter through the Emergency Room Entrance and you will be directed to Labor and Delivery. If your arrival time is 6:00 am or later, please enter the Medical Mall and follow the greeter's instructions.  REMEMBER: Instructions that are not followed completely may result in serious medical risk, up to and including death; or upon the discretion of your surgeon and anesthesiologist your surgery may need to be rescheduled.  Do not eat food or drink any liquids after midnight the night before surgery.  No gum chewing or hard candies.  One week prior to surgery: Stop Anti-inflammatories (NSAIDS) such as Advil, Aleve, Ibuprofen, Motrin, Naproxen, Naprosyn and Aspirin based products such as Excedrin, Goody's Powder, BC Powder. You may take Tylenol if needed for pain up until the day of surgery.  Stop ANY OVER THE COUNTER supplements until after surgery.  Continue taking all of your other prescription medications up until the day before surgery.  ON THE DAY OF SURGERY ONLY TAKE THESE MEDICATIONS WITH SIPS OF WATER:  buPROPion  (WELLBUTRIN  XL)    No Alcohol for 24 hours before or after surgery.  No Smoking including e-cigarettes for 24 hours prior to surgery.  No chewable tobacco products for at least 6 hours prior to surgery.  No nicotine patches on the day of surgery.  Do not use any recreational drugs for at least a week prior to your surgery.  Please be advised that the combination of cocaine and anesthesia may have negative outcomes, up to and including death. If you test positive for cocaine, your surgery will be cancelled.  On the morning of surgery brush your teeth with toothpaste and water, you may rinse your mouth with mouthwash if you  wish. Do not swallow any toothpaste or mouthwash.  Use CHG wash as directed on instruction sheet.  Do not wear jewelry, make-up, hairpins, clips or nail polish.  For welded (permanent) jewelry: bracelets, anklets, waist bands, etc.  Please have this removed prior to surgery.  If it is not removed, there is a chance that hospital personnel will need to cut it off on the day of surgery.  Do not wear lotions, powders, or perfumes.   Do not shave body hair from the neck down 48 hours before surgery.  Contact lenses, hearing aids and dentures may not be worn into surgery.  Do not bring valuables to the hospital. [facility] is not responsible for any missing/lost belongings or valuables.   Notify your doctor if there is any change in your medical condition (cold, fever, infection).  Wear comfortable clothing (specific to your surgery type) to the hospital.  After surgery, you can help prevent lung complications by doing breathing exercises.  Take deep breaths and cough every 1-2 hours. Your doctor may order a device called an Incentive Spirometer to help you take deep breaths.  When coughing or sneezing, hold a pillow firmly against your incision with both hands. This is called "splinting." Doing this helps protect your incision. It also decreases belly discomfort.  Please call the Pre-admissions Testing Dept. at [phone] if you have any questions about these instructions.  Surgery Visitation Policy:  Visitor Passes   All visitors, including children, need an identification sticker when visiting. These stickers must be worn where they can be  seen.   Labor & Delivery  Laboring women may have one designated support person and two other visitors of any age visit. The support person must remain the same. The visitors may switch with other visitors. Visitation is permitted 24 hours per day. The designated support person or a visitor over the age of 16 may sleep overnight in the  patient's room. A doula registered with [facility] for labor and delivery support is not considered a visitor. Doulas not registered with [facility] are considered visitors.  Mother Baby Unit, OB Specialty and Gynecological Care  A designated support person and three visitors of any age may visit. The three visitors may switch out. The designated support person or a visitor age 21 or older may stay overnight in the room. During the postpartum period (up to 6 weeks), if the mother is the patient, she can have her newborn stay with her if there is another support person present who can be responsible for the baby.      Preparing for Surgery with CHLORHEXIDINE GLUCONATE (CHG) Soap  Chlorhexidine Gluconate (CHG) Soap  o An antiseptic cleaner that kills germs and bonds with the skin to continue killing germs even after washing  o Used for showering the night before surgery and morning of surgery  Before surgery, you can play an important role by reducing the number of germs on your skin.  CHG (Chlorhexidine gluconate) soap is an antiseptic cleanser which kills germs and bonds with the skin to continue killing germs even after washing.  Please do not use if you have an allergy to CHG or antibacterial soaps. If your skin becomes reddened/irritated stop using the CHG.  1. Shower the NIGHT BEFORE SURGERY and the MORNING OF SURGERY with CHG soap.  2. If you choose to wash your hair, wash your hair first as usual with your normal shampoo.  3. After shampooing, rinse your hair and body thoroughly to remove the shampoo.  4. Use CHG as you would any other liquid soap. You can apply CHG directly to the skin and wash gently with a scrungie or a clean washcloth.  5. Apply the CHG soap to your body only from the neck down. Do not use on open wounds or open sores. Avoid contact with your eyes, ears, mouth, and genitals (private parts). Wash face and genitals (private parts) with your normal  soap.  6. Wash thoroughly, paying special attention to the area where your surgery will be performed.  7. Thoroughly rinse your body with warm water.  8. Do not shower/wash with your normal soap after using and rinsing off the CHG soap.  9. Pat yourself dry with a clean towel.  10. Wear clean pajamas to bed the night before surgery.  12. Place clean sheets on your bed the night of your first shower and do not sleep with pets.  13. Shower again with the CHG soap on the day of surgery prior to arriving at the hospital.  14. Do not apply any deodorants/lotions/powders.  15. Please wear clean clothes to the hospital.  How to Use an Incentive Spirometer  An incentive spirometer is a tool that measures how well you are filling your lungs with each breath. Learning to take long, deep breaths using this tool can help you keep your lungs clear and active. This may help to reverse or lessen your chance of developing breathing (pulmonary) problems, especially infection. You may be asked to use a spirometer: After a surgery. If you have  a lung problem or a history of smoking. After a long period of time when you have been unable to move or be active. If the spirometer includes an indicator to show the highest number that you have reached, your health care provider or respiratory therapist will help you set a goal. Keep a log of your progress as told by your health care provider. What are the risks? Breathing too quickly may cause dizziness or cause you to pass out. Take your time so you do not get dizzy or light-headed. If you are in pain, you may need to take pain medicine before doing incentive spirometry. It is harder to take a deep breath if you are having pain. How to use your incentive spirometer  Sit up on the edge of your bed or on a chair. Hold the incentive spirometer so that it is in an upright position. Before you use the spirometer, breathe out normally. Place the mouthpiece in  your mouth. Make sure your lips are closed tightly around it. Breathe in slowly and as deeply as you can through your mouth, causing the piston or the ball to rise toward the top of the chamber. Hold your breath for 3-5 seconds, or for as long as possible. If the spirometer includes a coach indicator, use this to guide you in breathing. Slow down your breathing if the indicator goes above the marked areas. Remove the mouthpiece from your mouth and breathe out normally. The piston or ball will return to the bottom of the chamber. Rest for a few seconds, then repeat the steps 10 or more times. Take your time and take a few normal breaths between deep breaths so that you do not get dizzy or light-headed. Do this every 1-2 hours when you are awake. If the spirometer includes a goal marker to show the highest number you have reached (best effort), use this as a goal to work toward during each repetition. After each set of 10 deep breaths, cough a few times. This will help to make sure that your lungs are clear. If you have an incision on your chest or abdomen from surgery, place a pillow or a rolled-up towel firmly against the incision when you cough. This can help to reduce pain while taking deep breaths and coughing. General tips When you are able to get out of bed: Walk around often. Continue to take deep breaths and cough in order to clear your lungs. Keep using the incentive spirometer until your health care provider says it is okay to stop using it. If you have been in the hospital, you may be told to keep using the spirometer at home. Contact a health care provider if: You are having difficulty using the spirometer. You have trouble using the spirometer as often as instructed. Your pain medicine is not giving enough relief for you to use the spirometer as told. You have a fever. Get help right away if: You develop shortness of breath. You develop a cough with bloody mucus from the  lungs. You have fluid or blood coming from an incision site after you cough. Summary An incentive spirometer is a tool that can help you learn to take long, deep breaths to keep your lungs clear and active. You may be asked to use a spirometer after a surgery, if you have a lung problem or a history of smoking, or if you have been inactive for a long period of time. Use your incentive spirometer as instructed every 1-2 hours  while you are awake. If you have an incision on your chest or abdomen, place a pillow or a rolled-up towel firmly against your incision when you cough. This will help to reduce pain. Get help right away if you have shortness of breath, you cough up bloody mucus, or blood comes from your incision when you cough. This information is not intended to replace advice given to you by your health care provider. Make sure you discuss any questions you have with your health care provider. Document Revised: 10/02/2019 Document Reviewed: 10/02/2019 Elsevier Patient Education  2023 [employer].

## 2024-01-12 ENCOUNTER — Ambulatory Visit (INDEPENDENT_AMBULATORY_CARE_PROVIDER_SITE_OTHER): Admitting: Obstetrics

## 2024-01-12 ENCOUNTER — Other Ambulatory Visit

## 2024-01-12 VITALS — BP 110/72 | HR 99 | Wt 243.2 lb

## 2024-01-12 DIAGNOSIS — O0993 Supervision of high risk pregnancy, unspecified, third trimester: Secondary | ICD-10-CM | POA: Diagnosis not present

## 2024-01-12 DIAGNOSIS — F429 Obsessive-compulsive disorder, unspecified: Secondary | ICD-10-CM

## 2024-01-12 DIAGNOSIS — R635 Abnormal weight gain: Secondary | ICD-10-CM

## 2024-01-12 DIAGNOSIS — O99213 Obesity complicating pregnancy, third trimester: Secondary | ICD-10-CM

## 2024-01-12 DIAGNOSIS — Z3A37 37 weeks gestation of pregnancy: Secondary | ICD-10-CM

## 2024-01-12 DIAGNOSIS — O10013 Pre-existing essential hypertension complicating pregnancy, third trimester: Secondary | ICD-10-CM | POA: Diagnosis not present

## 2024-01-12 DIAGNOSIS — O09523 Supervision of elderly multigravida, third trimester: Secondary | ICD-10-CM

## 2024-01-12 DIAGNOSIS — O0990 Supervision of high risk pregnancy, unspecified, unspecified trimester: Secondary | ICD-10-CM

## 2024-01-12 DIAGNOSIS — E663 Overweight: Secondary | ICD-10-CM

## 2024-01-12 DIAGNOSIS — O10913 Unspecified pre-existing hypertension complicating pregnancy, third trimester: Secondary | ICD-10-CM

## 2024-01-12 NOTE — Assessment & Plan Note (Signed)
-  RCS with possible BTL (still undecided) 01/05/24 -Reviewed hospital routines -Reviewed labor warning signs. Instructed to call office or come to hospital with persistent headache, vision changes, regular contractions, leaking of fluid, decreased fetal movement or vaginal bleeding.

## 2024-01-12 NOTE — Assessment & Plan Note (Signed)
-  Normotensive today -RNST

## 2024-01-12 NOTE — Patient Instructions (Signed)
 Nonstress Test: What to Expect A nonstress test, also called an NST, is done during pregnancy to check your baby's heartbeat. The procedure can help to show if your baby is healthy. It may be done if: Your due date has passed. Your pregnancy is high risk. Your baby is moving less than normal. You've lost a previous pregnancy. Your baby is growing slowly. There's too much or too little fluid around your baby. The NST may be done in the third trimester to find out if it's best for your baby to be born early. During an NST, your baby's heartbeat is watched for at least 20 minutes. If the baby is healthy, the heart rate will go up when the baby moves and will return to normal when the baby rests. This should happen at least twice during the test. Tell a health care provider about: Any allergies you have. Any medical problems you have. All medicines you take. These include vitamins, herbs, eye drops, and creams. Any surgeries you've had. Any past pregnancies you've had. What are the risks? There are no risks to you or your baby from a nonstress test. This procedure shouldn't be painful or uncomfortable. What happens before? Eat a meal right before the test or as told by your health care team. Food may help the baby to move. Use the restroom right before the test. What happens during a nonstress test?  Two monitors will be placed on your belly. One will check your baby's heart rate, and the other will check for contractions. You may be asked to lie down on your side or to sit up. You may be given a button to press when you feel your baby move. If your baby seems to be sleeping, you may be asked to drink some juice or soda, eat a snack, or change positions. These steps may vary. Ask what you can expect. What can I expect after? Your team will talk with you about the results and tell you the next steps. If your team gave you any diet or activity instructions, make sure to follow them. Keep all  follow-up visits. This is important to check on your health and the health of your baby. This information is not intended to replace advice given to you by your health care provider. Make sure you discuss any questions you have with your health care provider. Document Revised: 07/08/2023 Document Reviewed: 07/08/2023 Elsevier Patient Education  2025 [employer].

## 2024-01-12 NOTE — Progress Notes (Signed)
    Return Prenatal Note   Assessment/Plan   Plan  43 y.o. [id number] at [redacted]w[redacted]d presents for follow-up OB visit. Reviewed prenatal record including previous visit note.  Maternal chronic hypertension, third trimester -Normotensive today -RNST  Supervision of high risk pregnancy, antepartum -RCS with possible BTL (still undecided) 01/05/24 -Reviewed hospital routines -Reviewed labor warning signs. Instructed to call office or come to hospital with persistent headache, vision changes, regular contractions, leaking of fluid, decreased fetal movement or vaginal bleeding.     No orders of the defined types were placed in this encounter.  No follow-ups on file.   Future Appointments  Date Time Provider Department Center  01/17/2024  9:00 AM [facility] None  01/26/2024  1:15 PM [name], MD [facility] None    For next visit:  CS 01/05/24. Incision check 01/26/24    Subjective   Lauren [name] is ready to meet the baby! Everything is prepared at home, and she plans to take it easy this week.  Movement: Present Contractions: Irritability  Objective   Flow sheet Vitals: Pulse Rate: 99 BP: 110/72 Fundal Height: 38 cm Fetal Heart Rate (bpm): see NST Total weight gain: 35 lb 3.2 oz (16 kg)  General Appearance  No acute distress, well appearing, and well nourished Pulmonary   Normal work of breathing Neurologic   Alert and oriented to person, place, and time Psychiatric   Mood and affect within normal limits   Non-Stress Test Interpretation for 01/12/24  Indication: Advanced Maternal Age >40 years  Fetal Heart Rate A Mode: External Baseline Rate (A): 125 bpm Variability: Moderate Accelerations: 15 x 15 Decelerations: None Scalp Stimulation: Negative Multiple birth?: No  Uterine Activity Mode: Toco Contraction Frequency (min): Rare Contraction Duration (sec): 70 Contraction Quality: Mild Resting Time: Adequate  Interpretation (Fetal Testing) Nonstress Test  Interpretation: Reactive Overall Impression: Reassuring for gestational age    Lauren [name], [place] 01/12/24 8:43 AM

## 2024-01-17 ENCOUNTER — Telehealth: Payer: Self-pay

## 2024-01-17 ENCOUNTER — Encounter
Admission: RE | Admit: 2024-01-17 | Discharge: 2024-01-17 | Disposition: A | Discharge status: Home / Self Care | Source: Ambulatory Visit | Attending: Obstetrics | Admitting: Obstetrics

## 2024-01-17 DIAGNOSIS — O34219 Maternal care for unspecified type scar from previous cesarean delivery: Secondary | ICD-10-CM

## 2024-01-17 DIAGNOSIS — Z98891 History of uterine scar from previous surgery: Secondary | ICD-10-CM | POA: Insufficient documentation

## 2024-01-17 DIAGNOSIS — Z01818 Encounter for other preprocedural examination: Secondary | ICD-10-CM | POA: Insufficient documentation

## 2024-01-17 DIAGNOSIS — I10 Essential (primary) hypertension: Secondary | ICD-10-CM | POA: Insufficient documentation

## 2024-01-17 LAB — TYPE AND SCREEN
ABO/RH(D): O NEG
Antibody Screen: POSITIVE
Extend sample reason: UNDETERMINED

## 2024-01-17 LAB — CBC
HCT: 35.8 % — ABNORMAL LOW (ref 36.0–46.0)
Hemoglobin: 11.7 g/dL — ABNORMAL LOW (ref 12.0–15.0)
MCH: 30.9 pg (ref 26.0–34.0)
MCHC: 32.7 g/dL (ref 30.0–36.0)
MCV: 94.5 fL (ref 80.0–100.0)
Platelets: 168 10*3/uL (ref 150–400)
RBC: 3.79 MIL/uL — ABNORMAL LOW (ref 3.87–5.11)
RDW: 13.2 % (ref 11.5–15.5)
WBC: 8.2 10*3/uL (ref 4.0–10.5)
nRBC: 0 % (ref 0.0–0.2)

## 2024-01-17 LAB — BASIC METABOLIC PANEL WITH GFR
Anion gap: 11 (ref 5–15)
BUN: 15 mg/dL (ref 6–20)
CO2: 17 mmol/L — ABNORMAL LOW (ref 22–32)
Calcium: 8.8 mg/dL — ABNORMAL LOW (ref 8.9–10.3)
Chloride: 108 mmol/L (ref 98–111)
Creatinine, Ser: 0.51 mg/dL (ref 0.44–1.00)
GFR, Estimated: >60 mL/min (ref >60)
Glucose, Bld: 103 mg/dL — ABNORMAL HIGH (ref 70–99)
Potassium: 3.5 mmol/L (ref 3.5–5.1)
Sodium: 136 mmol/L (ref 135–145)

## 2024-01-17 NOTE — Telephone Encounter (Signed)
 Spoke to patient. She has received pre-op lab results and sees abnormal results for CBC and BMP. Inquiring if there is anything she needs to be concerned about.

## 2024-01-18 LAB — RPR: RPR Ser Ql: NONREACTIVE

## 2024-01-19 ENCOUNTER — Inpatient Hospital Stay: Payer: Self-pay | Admitting: Certified Registered"

## 2024-01-19 ENCOUNTER — Other Ambulatory Visit: Payer: Self-pay

## 2024-01-19 ENCOUNTER — Inpatient Hospital Stay: Payer: Self-pay | Admitting: Urgent Care

## 2024-01-19 ENCOUNTER — Encounter
Admission: RE | Disposition: A | Discharge status: Home / Self Care | Payer: Self-pay | Source: Ambulatory Visit | Attending: Obstetrics

## 2024-01-19 ENCOUNTER — Inpatient Hospital Stay
Admission: RE | Admit: 2024-01-19 | Discharge: 2024-01-21 | DRG: 784 | Disposition: A | Discharge status: Home / Self Care | Source: Ambulatory Visit | Attending: Obstetrics | Admitting: Obstetrics

## 2024-01-19 ENCOUNTER — Encounter: Payer: Self-pay | Admitting: Obstetrics

## 2024-01-19 DIAGNOSIS — D62 Acute posthemorrhagic anemia: Secondary | ICD-10-CM

## 2024-01-19 DIAGNOSIS — O09523 Supervision of elderly multigravida, third trimester: Secondary | ICD-10-CM | POA: Diagnosis not present

## 2024-01-19 DIAGNOSIS — Z9884 Bariatric surgery status: Secondary | ICD-10-CM

## 2024-01-19 DIAGNOSIS — O99213 Obesity complicating pregnancy, third trimester: Secondary | ICD-10-CM | POA: Diagnosis present

## 2024-01-19 DIAGNOSIS — Z833 Family history of diabetes mellitus: Secondary | ICD-10-CM | POA: Diagnosis not present

## 2024-01-19 DIAGNOSIS — Z6791 Unspecified blood type, Rh negative: Secondary | ICD-10-CM

## 2024-01-19 DIAGNOSIS — O9903 Anemia complicating the puerperium: Secondary | ICD-10-CM | POA: Diagnosis not present

## 2024-01-19 DIAGNOSIS — Z3A38 38 weeks gestation of pregnancy: Secondary | ICD-10-CM

## 2024-01-19 DIAGNOSIS — O1002 Pre-existing essential hypertension complicating childbirth: Secondary | ICD-10-CM | POA: Diagnosis not present

## 2024-01-19 DIAGNOSIS — O9902 Anemia complicating childbirth: Secondary | ICD-10-CM | POA: Diagnosis not present

## 2024-01-19 DIAGNOSIS — O34219 Maternal care for unspecified type scar from previous cesarean delivery: Principal | ICD-10-CM | POA: Diagnosis present

## 2024-01-19 DIAGNOSIS — Z85828 Personal history of other malignant neoplasm of skin: Secondary | ICD-10-CM

## 2024-01-19 DIAGNOSIS — O1092 Unspecified pre-existing hypertension complicating childbirth: Secondary | ICD-10-CM | POA: Diagnosis not present

## 2024-01-19 DIAGNOSIS — O34211 Maternal care for low transverse scar from previous cesarean delivery: Principal | ICD-10-CM | POA: Diagnosis present

## 2024-01-19 DIAGNOSIS — Z8249 Family history of ischemic heart disease and other diseases of the circulatory system: Secondary | ICD-10-CM

## 2024-01-19 DIAGNOSIS — O360130 Maternal care for anti-D [Rh] antibodies, third trimester, not applicable or unspecified: Secondary | ICD-10-CM

## 2024-01-19 DIAGNOSIS — E66813 Obesity, class 3: Secondary | ICD-10-CM | POA: Diagnosis present

## 2024-01-19 DIAGNOSIS — Z3A35 35 weeks gestation of pregnancy: Secondary | ICD-10-CM | POA: Diagnosis not present

## 2024-01-19 DIAGNOSIS — O99214 Obesity complicating childbirth: Secondary | ICD-10-CM | POA: Diagnosis present

## 2024-01-19 DIAGNOSIS — O164 Unspecified maternal hypertension, complicating childbirth: Secondary | ICD-10-CM | POA: Diagnosis not present

## 2024-01-19 DIAGNOSIS — O26893 Other specified pregnancy related conditions, third trimester: Secondary | ICD-10-CM | POA: Diagnosis present

## 2024-01-19 DIAGNOSIS — O99844 Bariatric surgery status complicating childbirth: Secondary | ICD-10-CM | POA: Diagnosis present

## 2024-01-19 DIAGNOSIS — O0990 Supervision of high risk pregnancy, unspecified, unspecified trimester: Secondary | ICD-10-CM

## 2024-01-19 DIAGNOSIS — Z79899 Other long term (current) drug therapy: Secondary | ICD-10-CM

## 2024-01-19 DIAGNOSIS — Z302 Encounter for sterilization: Secondary | ICD-10-CM | POA: Diagnosis not present

## 2024-01-19 DIAGNOSIS — O26899 Other specified pregnancy related conditions, unspecified trimester: Secondary | ICD-10-CM

## 2024-01-19 DIAGNOSIS — E669 Obesity, unspecified: Secondary | ICD-10-CM

## 2024-01-19 DIAGNOSIS — O10919 Unspecified pre-existing hypertension complicating pregnancy, unspecified trimester: Secondary | ICD-10-CM | POA: Diagnosis present

## 2024-01-19 LAB — ABO/RH: ABO/RH(D): O NEG

## 2024-01-19 SURGERY — Surgical Case
Anesthesia: General

## 2024-01-19 MED ORDER — BUPIVACAINE HCL (PF) 0.5 % IJ SOLN
INTRAMUSCULAR | Status: DC | PRN
  Administered 2024-01-19: 10 mL

## 2024-01-19 MED ORDER — DIPHENHYDRAMINE HCL 25 MG PO CAPS: 25.0000 mg | ORAL_CAPSULE | Freq: Four times a day (QID) | ORAL | Status: DC | PRN

## 2024-01-19 MED ORDER — WITCH HAZEL-GLYCERIN EX PADS
1.0000 | MEDICATED_PAD | CUTANEOUS | Status: DC | PRN
Start: 2024-01-19 — End: 2024-01-21

## 2024-01-19 MED ORDER — FENTANYL CITRATE (PF) 100 MCG/2ML IJ SOLN: 25.0000 ug | INTRAMUSCULAR | Status: DC | PRN

## 2024-01-19 MED ORDER — NALOXONE HCL 0.4 MG/ML IJ SOLN: 0.4000 mg | INTRAMUSCULAR | Status: DC | PRN

## 2024-01-19 MED ORDER — PHENYLEPHRINE HCL-NACL 20-0.9 MG/250ML-% IV SOLN
INTRAVENOUS | Status: AC
  Filled 2024-01-19: qty 250

## 2024-01-19 MED ORDER — OXYCODONE HCL 5 MG PO TABS: 5.0000 mg | ORAL_TABLET | Freq: Once | ORAL | Status: DC | PRN

## 2024-01-19 MED ORDER — BUPROPION HCL ER (XL) 150 MG PO TB24
150.0000 mg | ORAL_TABLET | Freq: Every day | ORAL | Status: DC
  Administered 2024-01-20 – 2024-01-21 (×2): 150 mg via ORAL
  Filled 2024-01-19 (×3): qty 1

## 2024-01-19 MED ORDER — DIPHENHYDRAMINE HCL 25 MG PO CAPS
25.0000 mg | ORAL_CAPSULE | ORAL | Status: DC | PRN
  Administered 2024-01-19: 25 mg via ORAL
  Filled 2024-01-19: qty 1

## 2024-01-19 MED ORDER — SODIUM CHLORIDE 0.9% FLUSH: 3.0000 mL | INTRAVENOUS | Status: DC | PRN

## 2024-01-19 MED ORDER — ONDANSETRON HCL 4 MG/2ML IJ SOLN
4.0000 mg | Freq: Three times a day (TID) | INTRAMUSCULAR | Status: DC | PRN
Start: 2024-01-19 — End: 2024-01-21

## 2024-01-19 MED ORDER — DIBUCAINE (PERIANAL) 1 % EX OINT: 1.0000 | TOPICAL_OINTMENT | CUTANEOUS | Status: DC | PRN

## 2024-01-19 MED ORDER — FENTANYL CITRATE (PF) 100 MCG/2ML IJ SOLN
INTRAMUSCULAR | Status: DC | PRN
  Administered 2024-01-19: 15 ug via INTRATHECAL

## 2024-01-19 MED ORDER — OXYTOCIN-SODIUM CHLORIDE 30-0.9 UT/500ML-% IV SOLN
2.5000 [IU]/h | INTRAVENOUS | Status: AC
  Administered 2024-01-19: 2.5 [IU]/h via INTRAVENOUS

## 2024-01-19 MED ORDER — BUPIVACAINE HCL (PF) 0.5 % IJ SOLN
5.0000 mL | Freq: Once | INTRAMUSCULAR | Status: DC
  Filled 2024-01-19: qty 10

## 2024-01-19 MED ORDER — NALBUPHINE HCL 10 MG/ML IJ SOLN
10.0000 mg | Freq: Once | INTRAMUSCULAR | Status: AC
  Administered 2024-01-19: 10 mg via INTRAVENOUS
  Filled 2024-01-19: qty 1

## 2024-01-19 MED ORDER — DIPHENHYDRAMINE HCL 50 MG/ML IJ SOLN
INTRAMUSCULAR | Status: AC
  Filled 2024-01-19: qty 1

## 2024-01-19 MED ORDER — LIDOCAINE HCL (PF) 1 % IJ SOLN
INTRAMUSCULAR | Status: DC | PRN
  Administered 2024-01-19: 3 mL via SUBCUTANEOUS

## 2024-01-19 MED ORDER — SOD CITRATE-CITRIC ACID 500-334 MG/5ML PO SOLN
ORAL | Status: AC
  Administered 2024-01-19: 30 mL via ORAL
  Filled 2024-01-19: qty 15

## 2024-01-19 MED ORDER — PHENYLEPHRINE HCL-NACL 20-0.9 MG/250ML-% IV SOLN
INTRAVENOUS | Status: DC | PRN
  Administered 2024-01-19: 40 ug/min via INTRAVENOUS

## 2024-01-19 MED ORDER — MENTHOL 3 MG MT LOZG: 1.0000 | LOZENGE | OROMUCOSAL | Status: DC | PRN

## 2024-01-19 MED ORDER — MORPHINE SULFATE (PF) 0.5 MG/ML IJ SOLN
INTRAMUSCULAR | Status: AC
  Filled 2024-01-19: qty 10

## 2024-01-19 MED ORDER — SOD CITRATE-CITRIC ACID 500-334 MG/5ML PO SOLN: 30.0000 mL | ORAL | Status: AC

## 2024-01-19 MED ORDER — BUPIVACAINE HCL (PF) 0.5 % IJ SOLN
INTRAMUSCULAR | Status: AC
  Filled 2024-01-19: qty 30

## 2024-01-19 MED ORDER — DIPHENHYDRAMINE HCL 50 MG/ML IJ SOLN
12.5000 mg | INTRAMUSCULAR | Status: DC | PRN
Start: 2024-01-19 — End: 2024-01-21

## 2024-01-19 MED ORDER — CEFAZOLIN SODIUM-DEXTROSE 2-4 GM/100ML-% IV SOLN
2.0000 g | INTRAVENOUS | Status: AC
  Administered 2024-01-19: 2 g via INTRAVENOUS
  Filled 2024-01-19: qty 100

## 2024-01-19 MED ORDER — OXYCODONE HCL 5 MG/5ML PO SOLN: 5.0000 mg | Freq: Once | ORAL | Status: DC | PRN

## 2024-01-19 MED ORDER — GABAPENTIN 100 MG PO CAPS
200.0000 mg | ORAL_CAPSULE | Freq: Two times a day (BID) | ORAL | Status: DC
  Administered 2024-01-19 – 2024-01-21 (×4): 200 mg via ORAL
  Filled 2024-01-19 (×4): qty 2

## 2024-01-19 MED ORDER — ONDANSETRON HCL 4 MG/2ML IJ SOLN
INTRAMUSCULAR | Status: DC | PRN
  Administered 2024-01-19: 4 mg via INTRAVENOUS

## 2024-01-19 MED ORDER — MORPHINE SULFATE (PF) 0.5 MG/ML IJ SOLN
INTRAMUSCULAR | Status: DC | PRN
  Administered 2024-01-19: .1 mg via INTRATHECAL

## 2024-01-19 MED ORDER — ACETAMINOPHEN 500 MG PO TABS
1000.0000 mg | ORAL_TABLET | Freq: Four times a day (QID) | ORAL | Status: DC
  Administered 2024-01-19 – 2024-01-21 (×7): 1000 mg via ORAL
  Filled 2024-01-19 (×8): qty 2

## 2024-01-19 MED ORDER — SIMETHICONE 80 MG PO CHEW
80.0000 mg | CHEWABLE_TABLET | Freq: Three times a day (TID) | ORAL | Status: DC
  Administered 2024-01-19 – 2024-01-21 (×5): 80 mg via ORAL
  Filled 2024-01-19 (×5): qty 1

## 2024-01-19 MED ORDER — NALOXONE HCL 4 MG/10ML IJ SOLN: 1.0000 ug/kg/h | INTRAVENOUS | Status: DC | PRN

## 2024-01-19 MED ORDER — LACTATED RINGERS IV SOLN: INTRAVENOUS | Status: DC

## 2024-01-19 MED ORDER — MEPERIDINE HCL 25 MG/ML IJ SOLN: 6.2500 mg | INTRAMUSCULAR | Status: DC | PRN

## 2024-01-19 MED ORDER — IBUPROFEN 600 MG PO TABS: 600.0000 mg | ORAL_TABLET | Freq: Four times a day (QID) | ORAL | Status: DC

## 2024-01-19 MED ORDER — DROPERIDOL 2.5 MG/ML IJ SOLN
0.6250 mg | Freq: Once | INTRAMUSCULAR | Status: DC | PRN
Start: 2024-01-19 — End: 2024-01-19

## 2024-01-19 MED ORDER — OXYCODONE HCL 5 MG PO TABS: 5.0000 mg | ORAL_TABLET | ORAL | Status: DC | PRN

## 2024-01-19 MED ORDER — BUPIVACAINE ON-Q PAIN PUMP (FOR ORDER SET NO CHG)
INJECTION | Status: DC
  Filled 2024-01-19: qty 1

## 2024-01-19 MED ORDER — BUPIVACAINE IN DEXTROSE 0.75-8.25 % IT SOLN
INTRATHECAL | Status: DC | PRN
  Administered 2024-01-19: 1.4 mL via INTRATHECAL

## 2024-01-19 MED ORDER — PRENATAL MULTIVITAMIN CH
1.0000 | ORAL_TABLET | Freq: Every day | ORAL | Status: DC
  Administered 2024-01-20 – 2024-01-21 (×2): 1 via ORAL
  Filled 2024-01-19 (×2): qty 1

## 2024-01-19 MED ORDER — SIMETHICONE 80 MG PO CHEW: 80.0000 mg | CHEWABLE_TABLET | ORAL | Status: DC | PRN

## 2024-01-19 MED ORDER — KETOROLAC TROMETHAMINE 30 MG/ML IJ SOLN: 30.0000 mg | Freq: Four times a day (QID) | INTRAMUSCULAR | Status: DC | PRN

## 2024-01-19 MED ORDER — EPHEDRINE SULFATE-NACL 50-0.9 MG/10ML-% IV SOSY
PREFILLED_SYRINGE | INTRAVENOUS | Status: DC | PRN
  Administered 2024-01-19: 15 mg via INTRAVENOUS
  Administered 2024-01-19 (×2): 10 mg via INTRAVENOUS
  Administered 2024-01-19: 5 mg via INTRAVENOUS

## 2024-01-19 MED ORDER — KETOROLAC TROMETHAMINE 30 MG/ML IJ SOLN
30.0000 mg | Freq: Four times a day (QID) | INTRAMUSCULAR | Status: DC
  Filled 2024-01-19: qty 1

## 2024-01-19 MED ORDER — SENNOSIDES-DOCUSATE SODIUM 8.6-50 MG PO TABS
2.0000 | ORAL_TABLET | Freq: Every day | ORAL | Status: DC
  Administered 2024-01-20 – 2024-01-21 (×2): 2 via ORAL
  Filled 2024-01-19 (×2): qty 2

## 2024-01-19 MED ORDER — ACETAMINOPHEN 500 MG PO TABS
1000.0000 mg | ORAL_TABLET | ORAL | Status: AC
  Administered 2024-01-19: 1000 mg via ORAL
  Filled 2024-01-19: qty 2

## 2024-01-19 MED ORDER — COCONUT OIL OIL: 1.0000 | TOPICAL_OIL | Status: DC | PRN

## 2024-01-19 MED ORDER — LACTATED RINGERS IV SOLN
Freq: Once | INTRAVENOUS | Status: AC
Start: 2024-01-19 — End: 2024-01-19

## 2024-01-19 MED ORDER — OXYTOCIN-SODIUM CHLORIDE 30-0.9 UT/500ML-% IV SOLN
INTRAVENOUS | Status: AC
  Filled 2024-01-19: qty 500

## 2024-01-19 MED ORDER — ORAL CARE MOUTH RINSE: 15.0000 mL | Freq: Once | OROMUCOSAL | Status: AC

## 2024-01-19 MED ORDER — CHLORHEXIDINE GLUCONATE 0.12 % MT SOLN
15.0000 mL | Freq: Once | OROMUCOSAL | Status: AC
  Administered 2024-01-19: 15 mL via OROMUCOSAL
  Filled 2024-01-19: qty 15

## 2024-01-19 MED ORDER — FENTANYL CITRATE (PF) 100 MCG/2ML IJ SOLN
INTRAMUSCULAR | Status: AC
  Filled 2024-01-19: qty 2

## 2024-01-19 MED ORDER — ACETAMINOPHEN 500 MG PO TABS: 1000.0000 mg | ORAL_TABLET | Freq: Four times a day (QID) | ORAL | Status: DC

## 2024-01-19 MED ORDER — BUPIVACAINE 0.25 % ON-Q PUMP DUAL CATH 400 ML
400.0000 mL | INJECTION | Status: DC
  Filled 2024-01-19: qty 400

## 2024-01-19 MED ORDER — OXYTOCIN-SODIUM CHLORIDE 30-0.9 UT/500ML-% IV SOLN
INTRAVENOUS | Status: DC | PRN
  Administered 2024-01-19: 30 [IU] via INTRAVENOUS

## 2024-01-19 MED ORDER — POVIDONE-IODINE 10 % EX SWAB
2.0000 | Freq: Once | CUTANEOUS | Status: AC
  Administered 2024-01-19: 2 via TOPICAL

## 2024-01-19 MED ORDER — ENOXAPARIN SODIUM 60 MG/0.6ML IJ SOSY
0.5000 mg/kg | PREFILLED_SYRINGE | INTRAMUSCULAR | Status: DC
  Administered 2024-01-20 – 2024-01-21 (×2): 55 mg via SUBCUTANEOUS
  Filled 2024-01-19 (×2): qty 0.6

## 2024-01-19 MED ORDER — ACETAMINOPHEN 10 MG/ML IV SOLN: 1000.0000 mg | Freq: Once | INTRAVENOUS | Status: DC | PRN

## 2024-01-19 MED ORDER — DIPHENHYDRAMINE HCL 50 MG/ML IJ SOLN
INTRAMUSCULAR | Status: DC | PRN
Start: 2024-01-19 — End: 2024-01-19
  Administered 2024-01-19: 12.5 mg via INTRAVENOUS

## 2024-01-19 SURGICAL SUPPLY — 35 items
BAG COUNTER SPONGE SURGICOUNT (BAG) ×1 IMPLANT
BENZOIN TINCTURE PRP APPL 2/3 (GAUZE/BANDAGES/DRESSINGS) IMPLANT
CATH KIT ON-Q SILVERSOAK 5 (CATHETERS) IMPLANT
CATH KIT ON-Q SILVERSOAK 5IN (CATHETERS) ×2 IMPLANT
CHLORAPREP W/TINT 26 (MISCELLANEOUS) ×2 IMPLANT
DERMABOND ADVANCED .7 DNX12 (GAUZE/BANDAGES/DRESSINGS) IMPLANT
DRESSING PEEL AND PLAC PRVNA20 (GAUZE/BANDAGES/DRESSINGS) IMPLANT
DRSG TELFA 3X8 NADH STRL (GAUZE/BANDAGES/DRESSINGS) ×1 IMPLANT
ELECTRODE REM PT RTRN 9FT ADLT (ELECTROSURGICAL) ×1 IMPLANT
GAUZE SPONGE 4X4 12PLY STRL (GAUZE/BANDAGES/DRESSINGS) ×1 IMPLANT
GLOVE BIO SURGEON STRL SZ 6 (GLOVE) ×1 IMPLANT
GLOVE BIOGEL PI IND STRL 6 (GLOVE) ×1 IMPLANT
GOWN STRL REUS W/ TWL LRG LVL3 (GOWN DISPOSABLE) ×2 IMPLANT
KIT MARKER MARGIN INK (KITS) IMPLANT
KIT PREVENA INCISION MGT20CM45 (CANNISTER) IMPLANT
KIT TURNOVER KIT A (KITS) ×1 IMPLANT
MANIFOLD NEPTUNE II (INSTRUMENTS) ×1 IMPLANT
MAT PREVALON FULL STRYKER (MISCELLANEOUS) ×1 IMPLANT
NS IRRIG 1000ML POUR BTL (IV SOLUTION) ×1 IMPLANT
PACK C SECTION AR (MISCELLANEOUS) ×1 IMPLANT
PAD OB MATERNITY 11 LF (PERSONAL CARE ITEMS) ×1 IMPLANT
PAD PREP OB/GYN DISP 24X41 (PERSONAL CARE ITEMS) ×1 IMPLANT
RETRACTOR TRAXI PANNICULUS (MISCELLANEOUS) IMPLANT
SCRUB CHG 4% DYNA-HEX 4OZ (MISCELLANEOUS) ×1 IMPLANT
STRIP CLOSURE SKIN 1/2X4 (GAUZE/BANDAGES/DRESSINGS) IMPLANT
SUT MNCRL AB 4-0 PS2 18 (SUTURE) ×1 IMPLANT
SUT MON AB 3-0 SH 27 (SUTURE) IMPLANT
SUT PROLENE 0 CT 1 30 (SUTURE) IMPLANT
SUT SILK 2 0 REEL (SUTURE) IMPLANT
SUT VIC AB 0 CT1 36 (SUTURE) ×4 IMPLANT
SUT VIC AB 0 CTX36XBRD ANBCTRL (SUTURE) IMPLANT
SUT VIC AB 3-0 SH 27X BRD (SUTURE) ×1 IMPLANT
SUT VIC AB 4-0 KS 27 (SUTURE) IMPLANT
TRAP FLUID SMOKE EVACUATOR (MISCELLANEOUS) ×1 IMPLANT
WATER STERILE IRR 500ML POUR (IV SOLUTION) ×1 IMPLANT

## 2024-01-19 NOTE — Anesthesia Preprocedure Evaluation (Addendum)
 Anesthesia Evaluation  Patient identified by MRN, date of birth, ID band Patient awake    Reviewed: Allergy & Precautions, H&P , NPO status , Patient's Chart, lab work & pertinent test results, reviewed documented beta blocker date and time   History of Anesthesia Complications (+) PONV and history of anesthetic complications  Airway Mallampati: III   Neck ROM: full    Dental  (+) Teeth Intact   Pulmonary sleep apnea    Pulmonary exam normal        Cardiovascular Exercise Tolerance: Good hypertension, On Medications Normal cardiovascular exam Rhythm:regular Rate:Normal     Neuro/Psych  Headaches PSYCHIATRIC DISORDERS Anxiety Depression    negative neurological ROS  negative psych ROS   GI/Hepatic negative GI ROS, Neg liver ROS,,,  Endo/Other  negative endocrine ROS    Renal/GU negative Renal ROS  negative genitourinary   Musculoskeletal   Abdominal   Peds  Hematology negative hematology ROS (+) Blood dyscrasia, anemia   Anesthesia Other Findings Past Medical History: No date: Anemia No date: Anxiety No date: Cancer (HCC) No date: Edema     Comment:  ankle swelling No date: Headache No date: Hypertension No date: PONV (postoperative nausea and vomiting)     Comment:  after 2nd csection felt very nauseous while still on the              table Past Surgical History: 02/2014: BARIATRIC SURGERY     Comment:  Gastric Sleeve 10/2015: BASAL CELL CARCINOMA EXCISION No date: C Section x 2 02/2014: CHOLECYSTECTOMY No date: DILATION AND CURETTAGE OF UTERUS No date: UPPER GASTROINTESTINAL ENDOSCOPY No date: WISDOM TOOTH EXTRACTION BMI    Body Mass Index: 44.48 kg/m     Reproductive/Obstetrics negative OB ROS                             Anesthesia Physical Anesthesia Plan  ASA: 3  Anesthesia Plan: Spinal   Post-op Pain Management:    Induction:   PONV Risk Score and Plan: 4  or greater  Airway Management Planned:   Additional Equipment:   Intra-op Plan:   Post-operative Plan:   Informed Consent: I have reviewed the patients History and Physical, chart, labs and discussed the procedure including the risks, benefits and alternatives for the proposed anesthesia with the patient or authorized representative who has indicated his/her understanding and acceptance.     Dental Advisory Given  Plan Discussed with: CRNA  Anesthesia Plan Comments:        Anesthesia Quick Evaluation

## 2024-01-19 NOTE — Lactation Note (Signed)
 This note was copied from a baby's chart. Lactation Consultation Note  Patient Name: Lauren [name] Unijb'd Date: 01/19/2024 Age:43 hours Reason for consult: L&D Initial assessment;1st time breastfeeding;Early term 37-38.6wks;Breastfeeding assistance;RN request   Maternal Data This is mom's 3rd baby, C/S. Mom with AMA and history of anxiety, hypertension, anemia , bariatric surgery, obesity, and mood disorder.  Requested by RN to assist mom with feeding in L&D. Baby latched and breastfed well. This is mom's 1st time breastfeeding. Her 2 previous children are 28 and 40 years old and mom reports they were formula fed. On follow-up this evening mom reports she is also going to supplement with formula and add in pumping. Per mom she gave formula this evening as she had visitors. Has patient been taught Hand Expression?: Yes   Feeding Mother's Current Feeding Choice: Breast Milk Provided tips and strategies to maximize position and latch techniques. Baby did latch well and breastfed with audible swallows. Support and encouragement provided to 3rd time mother as this is her 1st time breastfeeding. LATCH Score Latch: Repeated attempts needed to sustain latch, nipple held in mouth throughout feeding, stimulation needed to elicit sucking reflex.  Audible Swallowing: Spontaneous and intermittent  Type of Nipple: Everted at rest and after stimulation  Comfort (Breast/Nipple): Soft / non-tender  Hold (Positioning): Assistance needed to correctly position infant at breast and maintain latch.  LATCH Score: 8   Interventions Interventions: Breast feeding basics reviewed;Assisted with latch;Breast massage;Hand express;Breast compression;Adjust position;Support pillows;Education  Discharge Pump: Personal  Consult Status Consult Status: Follow-up from L&D Date: 01/20/24 Follow-up type: In-patient  Update provided to care nurse.  Lauren [name] 01/19/2024, 12:01 PM

## 2024-01-19 NOTE — Transfer of Care (Signed)
 Immediate Anesthesia Transfer of Care Note  Patient: Lauren [name]  Procedure(s) Performed: CESAREAN SECTION, WITH BILATERAL TUBAL LIGATION  Patient Location: PACU and Mother/Baby  Anesthesia Type:Spinal  Level of Consciousness: awake, alert , and oriented  Airway & Oxygen Therapy: Patient Spontanous Breathing  Post-op Assessment: Report given to RN and Post -op Vital signs reviewed and stable  Post vital signs: Reviewed and stable  Last Vitals:  Vitals Value Taken Time  BP 111/63 (80) 01/19/2024  0951  Temp    Pulse 77   Resp 20   SpO2 99     Last Pain:  Vitals:   01/19/24 0559  TempSrc: Oral         Complications: No notable events documented.

## 2024-01-19 NOTE — Op Note (Signed)
 CESAREAN SECTION OPERATIVE REPORT   DATE OF SURGERY: 01/19/24  SURGEON: Estil Mangle, DO ASSISTANT:  Lydia Dominic,CNM ANESTHESIA: Spinal  PROCEDURE: Repeat low transverse cesarean section Bilateral tubal ligation, [place] method  PREOPERATIVE DIAGNOSES: 1. Intrauterine pregnancy at [redacted]w[redacted]d 2. History of previous cesarean section x 2 3. Desires surgical sterilization 4. Chronic hypertension 5. AMA 6. Elevated BMI 7. Hx of gastric sleeve  POSTOPERATIVE DIAGNOSES: 1. Same, s/p rLTCS w/BTL 2. Uterine window in lower segment  QBL:  910cc DRAINS: foley catheter to gravity drainage, 30 ml of clear urine at end of the procedure TOTAL IV FLUIDS: 1200 ml SPECIMENS: Bilateral tube segments; cord blood COMPLICATIONS:  None  FINDINGS:  Viable female infant in cephalic presentation; APGARs 9/9; weight 3650 grams (8lbs, 1oz) Clear fluid at amniotomy Intact placenta with 3 vessel cord Uterus, tubes, and ovaries appeared normal  INDICATION and CONSENT: Lauren [name] is a 43 y.o. [phone] with IUP at [redacted]w[redacted]d presenting for scheduled repeat cesarean with tubal ligation in setting of chronic hypertension. The patient understood that the risks of cesarean section include, but are not limited to, visceral or vascular injury, infection, blood loss and need for transfusion, prolonged hospitalization, and reoperation.  The patient stated understanding and desired to proceed.  All questions were answered.  PROCEDURE:  After verbal and written informed consent was obtained, the patient was taken to the operating room where spinal anesthesia was found to be adequate.  SCDs were applied to the lower extremities and a Foley catheter was placed in the bladder under sterile technique.  The patient was placed in dorsal supine position with a leftward tilt, prepped and draped in a sterile fashion.  Two grams of Cefazolin were given for infection prophylaxis.  Level of anesthesia was confirmed to be adequate  with Allis clamps.  A Pfannenstiel skin incision was made with the scalpel and carried down to the underlying layer of rectus fascia.  The fascia was nicked bilaterally in the midline with the Bovie and the fascial incision was extended laterally using Mayo scissors.  The superior aspect of the fascia was grasped with Kocher clamps and the underlying rectus muscles were dissected off sharply with Mayo scissors and bluntly.  In a similar fashion, the inferior aspect of fascia was grasped with Kocher clamps and the underlying rectus and pyramidalis were dissected off sharply and bluntly.  The rectus muscles were separated in the midline bluntly.  The peritoneum was found to be free of adherent bowel and entered bluntly.  The peritoneal incision was extended bluntly to the bladder reflection with good visualization of the bladder.  The Alexis retractor was inserted and vesicouterine peritoneum identified.  Intraabdomnial survey revealed scant, clear peritoneal fluid and a thinned-out lower uterine segment.  A 3cm x 2cm uterine window was noted in the middle of the lower segment. There were adhesions from the bladder to the body of the uterus, which were lysed. The vesicouterine peritoneum was opened with Metzenbaum scissors and the bladder flap was developed.  The lower uterine segment was incised transversely with the scalpel.  The amniotic sac was ruptured with an Allis clamp and clear fluid noted.  The uterine incision was extended bluntly in a cranial-caudal fashion.  The fetus was in cephalic presentation.  The head was flexed and elevated to the level of the uterine incision.  Gentle fundal pressure was applied by the assistant and the infant was delivered without difficulty.  The nose and mouth were suctioned with a bulb. Delayed cord  clamping was performed for approximately 60 seconds. The infant was shown to the patient. The cord was doubly clamped and cut, with cord blood obtained.  The infant was handed  to the awaiting NICU team.  The placenta delivered intact & spontaneously with manual massage of the uterine fundus. The uterus was left in situ.  The inside of the uterus was gently wiped with lap sponges x 2 ensuring complete removal of placental membranes. The uterine incision was repaired with a double layer closure of 0-Vicryl first in a locking fashion, followed by 0-Vicryl in an imbricating stitch, with excellent hemostasis achieved.  The ovaries and tubes were found to be grossly normal.    Attention was then turned to the fallopian tubes. The left fallopian tube was identified and grasped with a Babcock clamp.  The tube was followed out to the fimbriae. A window was created in the mesosalpinx. The distal and proximal ends of the tube were individually ligated with 3-0 Plain suture. The tube was excised and sent to pathology for confirmation. Good hemostasis on both ends of the tubal stumps were noted. The peritoneal edge of the broad ligament was noted to be hemostatic. The tube was returned to the abdominal cavity.  The right fallopian tube was ligated in a similar fashion.  Good hemostasis was again noted.   Blood clots, debris and fluid were cleaned from the abdomen, gutters, and pelvis with moist laparotomy sponges.  The uterine incision was reinspected and was hemostatic, as were the tubal sites.  The On-Q catheter pumps were inserted in accordance with the manufacturer's recommendations.  The catheters were inserted approximately 4cm cephelad to the incision line, approximately 1cm apart, straddling the midline.  They were inserted to a depth of the 4th mark. One catheter was positioned on the uterus and the second was positioned superficial to the rectus abdominus muscles and deep to the rectus fascia.    The superior and inferior fascia were grasped with Kocher clamps and the rectus muscles were examined and found to be hemostatic, ensured with Bovie electrocautery.  The fascial layer was  closed with 0-Vicryl in a running fashion.  The subcutaneous tissue was irrigated, made hemostatic with Bovie electrocautery, then reapproximated with a running layer of 3-0 Plain. The skin was closed subcuticularly with 4-0 Vicryl on a keith and steristrips and a Prevena wound vac was placed.  The On-Q catheters were bolused with 5 mL of 0.5% marcaine plain for a total of 10 mL.  The catheters were affixed to the skin with surgical skin glue, steri-strips, and tegaderm.  All sponge, lap and instrument counts were correct x 2. The patient tolerated the procedure well and was taken to the recovery room in stable condition.  An experienced assistant was required given the standard of surgical care given the complexity of the case.  This assistant was needed for exposure, dissection, suctioning, retraction, instrument exchange, and for overall help during the procedure.   Estil Mangle, DO [facility]

## 2024-01-19 NOTE — Anesthesia Procedure Notes (Signed)
 Spinal  Patient location during procedure: OR Reason for block: surgical anesthesia Staffing Performed: resident/CRNA  Anesthesiologist: Myra Lynwood MATSU, MD Performed by: Lacretia Camelia NOVAK, CRNA Authorized by: Myra Lynwood MATSU, MD   Preanesthetic Checklist Completed: patient identified, IV checked, site marked, risks and benefits discussed, surgical consent, monitors and equipment checked, pre-op evaluation and timeout performed Spinal Block Patient position: sitting Prep: ChloraPrep Patient monitoring: heart rate, continuous pulse ox, blood pressure and cardiac monitor Approach: midline Location: L3-4 Injection technique: single-shot Needle Needle type: Introducer and Pencan  Needle gauge: 24 G Needle length: 9 cm Assessment Sensory level: T10 Events: CSF return Additional Notes Sterile aseptic technique used throughout the procedure.  Negative paresthesia. Negative blood return. Positive free-flowing CSF. Expiration date of kit checked and confirmed. Patient tolerated procedure well, without complications.

## 2024-01-19 NOTE — H&P (Signed)
 Obstetric Preoperative History and Physical  Lauren [name] is a 43 y.o. [id number] with IUP at [redacted]w[redacted]d presenting for scheduled cesarean section.  Reports good fetal movement, no bleeding, no contractions, no leaking of fluid.  No acute preoperative concerns.  She expresses her desire for tubal ligation and states is 100% sure.   Pregnancy has been notable for chronic hypertension, not on medications; elevated BMI, AMA, hx of gastric sleeve, hx of macrosomia in prior 2 pregnancies, and prior c-section x 2 desiring repeat.   Cesarean Section Indication: repeat  Prenatal Course Source of Care: [facility]  with onset of care at 11 weeks Pregnancy complications or risks: Patient Active Problem List   Diagnosis Date Noted   History of cesarean delivery, currently pregnant 01/19/2024   Labor and delivery indication for care or intervention 01/08/2024   Maternal chronic hypertension, third trimester 12/29/2023   [redacted] weeks gestation of pregnancy 12/29/2023   [redacted] weeks gestation of pregnancy 12/21/2023   Decreased fetal movement 12/01/2023   AMA (advanced maternal age) multigravida 35+, third trimester 11/10/2023   History of cesarean section complicating pregnancy 11/10/2023   HTN in pregnancy, chronic 10/05/2023   Rh negative state in antepartum period 07/22/2023   Vaginal bleeding in pregnancy 07/22/2023   Supervision of high risk pregnancy, antepartum 06/23/2023   Headache 05/20/2023   Mood disorder (HCC) 10/20/2022   Obesity, Class III, BMI 40-49.9 (morbid obesity) 10/20/2022   Weight gain 09/03/2022   Obsessive-compulsive disorder 01/23/2022   Anxiety 01/23/2022   Central auditory processing disorder (CAPD) 01/23/2022   Genetic testing 12/16/2021   Acute cough 09/02/2021   B12 deficiency 09/02/2021   Non-recurrent acute serous otitis media of right ear 09/02/2021   Post viral syndrome 09/02/2021   Depression, major, single episode, moderate (HCC) 09/02/2021   Sleep apnea 09/02/2021    History of URI (upper respiratory infection) 08/21/2021   History of difficulty in concentrating 08/21/2021   Intertriginous candidiasis 01/07/2021   Overweight 08/21/2020   Body mass index (BMI) of 36.0-36.9 in adult 10/05/2019   Other fatigue 10/05/2019   Allergic contact dermatitis 10/05/2019   Generalized anxiety disorder 12/13/2018   Vitamin D  deficiency 05/09/2018   History of gastric bypass 05/09/2018   Environmental and seasonal allergies 05/01/2016   Nausea 09/10/2015   Essential (primary) hypertension 12/07/2014   Headache, migraine 12/07/2014   Obesity affecting pregnancy in third trimester 12/07/2014   Neoplasm of uncertain behavior of skin 12/07/2014   Addison anemia 12/07/2014   She plans to breastfeed She desires bilateral tubal ligation for postpartum contraception.   Prenatal labs and studies: ABO, Rh: O NEG (06/25 0558) Antibody: POS (06/23 0904) Rubella: 1.09 (12/23 1133) RPR: NON REACTIVE (06/23 0904)  HBsAg: Negative (12/23 1133)  HIV: Non Reactive (04/16 1028)  HAD:Wzhjupcz/-- (06/11 0905) 1 hr Glucola  95 Genetic screening: MaterniT21 normal Anatomy US  normal  Past Medical History:  Diagnosis Date   Anemia    Anxiety    Cancer (HCC)    Edema    ankle swelling   Headache    Hypertension    PONV (postoperative nausea and vomiting)    after 2nd csection felt very nauseous while still on the table    Past Surgical History:  Procedure Laterality Date   BARIATRIC SURGERY  02/2014   Gastric Sleeve   BASAL CELL CARCINOMA EXCISION  10/2015   C Section x 2     CHOLECYSTECTOMY  02/2014   DILATION AND CURETTAGE OF UTERUS     UPPER  GASTROINTESTINAL ENDOSCOPY     WISDOM TOOTH EXTRACTION      OB History  Gravida Para Term Preterm AB Living  4 2 2  1 2   SAB IAB Ectopic Multiple Live Births  1    2    # Outcome Date GA Lbr Len/2nd Weight Sex Type Anes PTL Lv  4 Current           3 Term 04/07/07 [redacted]w[redacted]d  4593 g M CS-Unspec  N LIV  2 SAB 11/2005              Birth Comments: Blighted ovum; had D&C  1 Term 02/02/02 [redacted]w[redacted]d  4281 g F CS-Vac  N LIV     Complications: Failure to Progress in First Stage, Failure to Progress in Second Stage    Social History   Socioeconomic History   Marital status: Married    Spouse name: Koren   Number of children: 2   Years of education: 14   Highest education level: Not on file  Occupational History   Occupation: works at home for [employer]  Tobacco Use   Smoking status: Never   Smokeless tobacco: Never  Vaping Use   Vaping status: Never Used  Substance and Sexual Activity   Alcohol use: No    Alcohol/week: 0.0 standard drinks of alcohol   Drug use: No   Sexual activity: Yes    Partners: Male    Birth control/protection: Surgical  Other Topics Concern   Not on file  Social History Narrative   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    Husband   2 dogs               Social Drivers of Corporate investment banker Strain: Low Risk  (06/23/2023)   Overall Financial Resource Strain (CARDIA)    Difficulty of Paying Living Expenses: Not hard at all  Food Insecurity: No Food Insecurity (01/19/2024)   Hunger Vital Sign    Worried About Running Out of Food in the Last Year: Never true    Ran Out of Food in the Last Year: Never true  Transportation Needs: No Transportation Needs (01/19/2024)   PRAPARE - Administrator, Civil Service (Medical): No    Lack of Transportation (Non-Medical): No  Physical Activity: Insufficiently Active (06/23/2023)   Exercise Vital Sign    Days of Exercise per Week: 1 day    Minutes of Exercise per Session: 60 min  Stress: Stress Concern Present (06/23/2023)   [employer] of Occupational Health - Occupational Stress Questionnaire    Feeling of Stress : Very much  Social Connections: Socially Integrated (01/19/2024)   Social Connection and Isolation Panel    Frequency of  Communication with Friends and Family: Three times a week    Frequency of Social Gatherings with Friends and Family: Three times a week    Attends Religious Services: 1 to 4 times per year    Active Member of Clubs or Organizations: No    Attends Engineer, structural: More than 4 times per year    Marital Status: Married    Family History  Problem Relation Age of Onset   Cancer Mother        essential thrombocythemia, JAK2+   Migraines Mother    Hypertension Mother    Hyperlipidemia Mother    Depression Mother  Sleep apnea Mother    Cancer Father 30       bladder   COPD Father    Healthy Brother    Breast cancer Maternal Grandmother        dx 30s   Pancreatic cancer Maternal Grandmother        dx 66s d. 71s   Non-Hodgkin's lymphoma Maternal Grandfather        d. late 70s   Diabetes Paternal Grandmother        type l   Alzheimer's disease Paternal Grandfather    Asthma Daughter    Allergies Daughter    Asthma Son    Allergies Son    Breast cancer Maternal Aunt        dx late 6s    Medications Prior to Admission  Medication Sig Dispense Refill Last Dose/Taking   acetaminophen (TYLENOL) 500 MG tablet Take 1,000 mg by mouth every 6 (six) hours as needed.      buPROPion  (WELLBUTRIN  XL) 300 MG 24 hr tablet Take 1 tablet (300 mg total) by mouth daily. D/ 150 mg qd 90 tablet 3    cholecalciferol  (VITAMIN D3) 25 MCG (1000 UNIT) tablet Take 1,000 Units by mouth daily. (Patient not taking: Reported on 01/12/2024)      folic acid  (FOLVITE ) 800 MCG tablet Take 800 mcg by mouth daily. (Patient not taking: Reported on 01/12/2024)      prenatal vitamin w/FE, FA (NATACHEW) 29-1 MG CHEW chewable tablet Chew 2 tablets by mouth daily at 12 noon.       Allergies  Allergen Reactions   Prozac [Fluoxetine]     No help    Zoloft [Sertraline]     Zombie    Review of Systems: Pertinent items noted in HPI and remainder of comprehensive ROS otherwise negative.  Physical  Exam: Temp 98.1 F (36.7 C) (Oral)   Resp 14   Ht 5' 2 (1.575 m)   Wt 110.3 kg   LMP 04/28/2023 (Exact Date)   BMI 44.48 kg/m  FHR by Doppler: 140 bpm CONSTITUTIONAL: Well-developed, well-nourished female in no acute distress.  HENT:  Normocephalic, atraumatic, External right and left ear normal. Oropharynx is clear and moist EYES: Conjunctivae and EOM are normal. Pupils are equal, round, and reactive to light. No scleral icterus.  NECK: Normal range of motion, supple, no masses SKIN: Skin is warm and dry. No rash noted. Not diaphoretic. No erythema. No pallor. NEUROLOGIC: Alert and oriented to person, place, and time. Normal reflexes, muscle tone coordination. No cranial nerve deficit noted. PSYCHIATRIC: Normal mood and affect. Normal behavior. Normal judgment and thought content. CARDIOVASCULAR: Normal heart rate noted, regular rhythm RESPIRATORY: Effort and breath sounds normal, no problems with respiration noted ABDOMEN: Soft, nontender, nondistended, gravid. Well-healed Pfannenstiel incision. PELVIC: Deferred MUSCULOSKELETAL: Normal range of motion. No edema and no tenderness. 2+ distal pulses.  Pertinent Labs/Studies:   Results for orders placed or performed during the hospital encounter of 01/19/24 (from the past 72 hours)  ABO/Rh     Status: None   Collection Time: 01/19/24  5:58 AM  Result Value Ref Range   ABO/RH(D)      MALVA NEG Performed at [facility], [address], [place], [place] [address]     Assessment and Plan: Lauren [name] is a 43 y.o. [id number] at [redacted]w[redacted]d by LMP=9wk US  being admitted for scheduled cesarean section with tubal ligation. The risks of surgery were discussed with the patient including but were not limited to: bleeding  which may require transfusion or reoperation; infection which may require antibiotics; injury to bowel, bladder, ureters or other surrounding organs; injury to the fetus; need for additional procedures including  hysterectomy in the event of a life-threatening hemorrhage; formation of adhesions; placental abnormalities wth subsequent pregnancies; incisional problems; thromboembolic phenomenon and other postoperative/anesthesia complications. The patient concurred with the proposed plan, giving informed written consent for the procedure. Patient has been NPO since last night she will remain NPO for procedure. Anesthesia and OR aware. Preoperative prophylactic antibiotics and SCDs ordered on call to the OR. To OR when ready.   cHTN:  -Stable not on medications and BP normotensive today -Weekly monitoring reassuring -Most recent EFW at 34wks = 47%ile  AMA:  -NIPT negative, anatomy US  without gross anomalies -Weekly NSTs reassuring  BMI: 44.48 -Plan for Lovenox VTE ppx post-op   Estil Mangle, DO [facility]

## 2024-01-20 ENCOUNTER — Encounter: Payer: Self-pay | Admitting: Obstetrics

## 2024-01-20 LAB — CBC
HCT: 31.8 % — ABNORMAL LOW (ref 36.0–46.0)
Hemoglobin: 10.8 g/dL — ABNORMAL LOW (ref 12.0–15.0)
MCH: 32.1 pg (ref 26.0–34.0)
MCHC: 34 g/dL (ref 30.0–36.0)
MCV: 94.6 fL (ref 80.0–100.0)
Platelets: 146 10*3/uL — ABNORMAL LOW (ref 150–400)
RBC: 3.36 MIL/uL — ABNORMAL LOW (ref 3.87–5.11)
RDW: 13.3 % (ref 11.5–15.5)
WBC: 10 10*3/uL (ref 4.0–10.5)
nRBC: 0 % (ref 0.0–0.2)

## 2024-01-20 LAB — FETAL SCREEN: Fetal Screen: NEGATIVE

## 2024-01-20 LAB — SURGICAL PATHOLOGY

## 2024-01-20 MED ORDER — RHO D IMMUNE GLOBULIN 1500 UNIT/2ML IJ SOSY
300.0000 ug | PREFILLED_SYRINGE | Freq: Once | INTRAMUSCULAR | Status: AC
  Administered 2024-01-20: 300 ug via INTRAVENOUS
  Filled 2024-01-20: qty 2

## 2024-01-20 NOTE — Lactation Note (Signed)
 This note was copied from a baby's chart. Lactation Consultation Note  Patient Name: Lauren [name] Date: 01/20/2024 Age:43 hours Reason for consult: Follow-up assessment;Early term 37-38.6wks   Maternal Data Follow up assessment w/ a 27hr old baby Lauren Lauren [name] and parents.  Mom stated that her nipples were a little sore.    Feeding Mother's Current Feeding Choice: Breast Milk and Formula  Infant nursing upon entry into room.    Lactation Tools Discussed/Used Patient had a DEBP in room.  Interventions Interventions: Breast feeding basics reviewed;Education  LC provided education on cluster feeding and reviewed how often infant should feed in a day.  Wet/dirty diapers per day were discussed as well.  LC answered  moms questions on how one would know if infant is feeding well at the breast.  Consult Status Consult Status: Follow-up Follow-up type: In-patient    [employer] Null S Michall Noffke 01/20/2024, 12:23 PM

## 2024-01-20 NOTE — Anesthesia Postprocedure Evaluation (Signed)
 Anesthesia Post Note  Patient: Lauren [name]  Procedure(s) Performed: CESAREAN SECTION, WITH BILATERAL TUBAL LIGATION  Patient location during evaluation: Mother Baby Anesthesia Type: General Level of consciousness: oriented and awake and alert Pain management: pain level controlled Vital Signs Assessment: post-procedure vital signs reviewed and stable Respiratory status: spontaneous breathing and respiratory function stable Cardiovascular status: blood pressure returned to baseline and stable Postop Assessment: no headache, no backache, no apparent nausea or vomiting and able to ambulate Anesthetic complications: no   No notable events documented.   Last Vitals:  Vitals:   01/20/24 0300 01/20/24 0739  BP:  119/70  Pulse: 70 79  Resp:  18  Temp:  36.6 C  SpO2: 95% 98%    Last Pain:  Vitals:   01/20/24 0739  TempSrc: Oral  PainSc:                  [place] [facility]

## 2024-01-20 NOTE — Lactation Note (Signed)
 This note was copied from a baby'[name] chart. Lactation Consultation Note  Patient Name: Lauren [name] Unijb'd Date: 01/20/2024 Age:43 hours Reason for consult: Follow-up assessment;Early term 37-38.6wks   Maternal Data Follow up assessment w/ patient.  Mom stated that she would like lactation to measure nipples. Mom also had questions on breastmilk storage.  Feeding Mother'[name] Current Feeding Choice: Breast Milk and Formula  Lactation Tools Discussed/Used LC provided education on DEBP, portable pumps, and manual pumps.    Interventions Interventions: Education;CDC milk storage guidelines;CDC Guidelines for Breast Pump Cleaning  Mom is measuring around a 18mm flange.  Discussed engorgement prevention/treatment, how to support milk expectations with portable pumps. LC informed mom that if she gives infant a bottle of formula she will need to make sure she stimulates breast w/ pumping.  Mom verbalized understanding.  Consult Status Consult Status: Follow-up Follow-up type: In-patient    Lauren [name] Lauren [name] 01/20/2024, 4:15 PM

## 2024-01-20 NOTE — Progress Notes (Signed)
 Subjective:   Lauren [name] had a repeat c/s with BTL on 01/19/24 at 0835. Has had routine postpartum care.  Pt. Is eating, hydrating, and voiding regularly without difficulty. Has yet to have BM. She is breastfeeding, baby has been very sleepy and had some difficulty latching, has supplementing with formula a few times. Reports moderate amount of vaginal bleeding, denies passing large blood clots. Has had incisional pain reduced with ibuprofen, tylenol, On-Q pump and gabapentin. Had BTL for contraception. Denies anxiety/depression symptoms. Endorses good support from partner and family.   Objective:  Vital signs in last 24 hours: Temp:  [97.6 F (36.4 C)-97.9 F (36.6 C)] 97.9 F (36.6 C) (06/26 0739) Pulse Rate:  [70-98] 79 (06/26 0739) Resp:  [16-18] 18 (06/26 0739) BP: (113-119)/(66-73) 119/70 (06/26 0739) SpO2:  [95 %-100 %] 98 % (06/26 0739)    General: NAD Pulmonary: no increased work of breathing Breasts: soft, non-tender, nipples without breakdown Abdomen: soft, non-tender Fundus: firm, midline, at umbilicus Lochia: light rubra, no clots Incision: wound vac on but not working properly, machine making noise and sponge part is not compressed Perineum: no erythema or foul odor discharge, minimal edema, laceration well approximated  Extremities: no edema, no erythema, no tenderness  Results for orders placed or performed during the hospital encounter of 01/19/24 (from the past 72 hours)  Surgical pathology     Status: None   Collection Time: 01/19/24 12:00 AM  Result Value Ref Range   SURGICAL PATHOLOGY      SURGICAL PATHOLOGY [facility] [address], [address] [place], [place] [address] Telephone [phone] or [phone] Fax [phone]  REPORT OF SURGICAL PATHOLOGY   Accession #: [id number] Patient Name: Lauren, [name] Visit # : [id number]  MRN: [id number] Physician: Leigh Sober DOB/Age [birth year] (Age: 43) Gender:  F Collected Date: 01/19/2024 Received Date: 01/19/2024  FINAL DIAGNOSIS       1. Fallopian tube, left, segment :       - FALLOPIAN TUBE WITH FIMBRIA PRESENT.       2. Fallopian tube, right, segment :       - FALLOPIAN TUBE WITH PARATUBAL CYST.       ELECTRONIC SIGNATURE : Rubinas Md, Rexene , Sports administrator, Electronic Signature  MICROSCOPIC DESCRIPTION  CASE COMMENTS STAINS USED IN DIAGNOSIS: H&E H&E    CLINICAL HISTORY  SPECIMEN(S) OBTAINED 1. Fallopian tube, left, Segment 2. Fallopian tube, right, Segment  SPECIMEN COMMENTS: SPECIMEN CLINICAL INFORMATION: 1. Previous C section x2    Gross Descr iption 1. Left tube segment, received in formalin is a single 3.5 cm long, pink-red, tortuous fallopian tube segment with scant, possible fimbria and a 0.6 cm (average) diameter.The cut surfaces are grossly unremarkable and devoid of discrete lesions.Representative sections including the entire (possible) fimbria are submitted in 1 block (1A). 2. Right tube segment, received in formalin is a single 2.4 cm long, pink-red fallopian tube segment with scant, possible fimbria, a 0.1 cm paratubal cyst, and a 0.5 cm (average) diameter.The cut surfaces are grossly unremarkable and devoid of discrete lesions.The specimen is submitted entirely in 1 block (2A).      AMG 01/19/2024        Report signed out from the following location(s) [place] [address] [place], [place] [address] CLIA #: [id number]  [facility] [address] [place], [place] [address] CLIA #: [id number]   ABO/Rh     Status: None   Collection Time: 01/19/24  5:58 AM  Result Value Ref Range   ABO/RH(D)      O NEG Performed at [facility], [address] [place], [place] [address]   Fetal screen     Status: None   Collection Time: 01/20/24  4:43 AM  Result Value Ref Range   Fetal Screen      NEG Performed at [facility], [address] [place], [place] [address]   Rhogam injection     Status: None (Preliminary result)   Collection Time: 01/20/24  4:43 AM  Result Value Ref Range   Unit Number [id number]    Blood Component Type RHIG    Unit division 00    Status of Unit ALLOCATED    Transfusion Status OK TO TRANSFUSE   CBC     Status: Abnormal   Collection Time: 01/20/24  4:43 AM  Result Value Ref Range   WBC 10.0 4.0 - 10.5 K/uL   RBC 3.36 (L) 3.87 - 5.11 MIL/uL   Hemoglobin 10.8 (L) 12.0 - 15.0 g/dL   HCT 68.1 (L) 63.9 - 53.9 %   MCV 94.6 80.0 - 100.0 fL   MCH 32.1 26.0 - 34.0 pg   MCHC 34.0 30.0 - 36.0 g/dL   RDW 86.6 88.4 - 84.4 %   Platelets 146 (L) 150 - 400 K/uL   nRBC 0.0 0.0 - 0.2 %    Comment: Performed at [facility], [address], [place], [place] [address]    Assessment:   43 y.o. [phone] 1 day(s)  s/p RLTCS with BTL Breastfeeding Anemia secondary to acute blood loss- hemodynamically stable and symptomatic VSS Pain well controlled  Plan:    PO Fe Blood Type --/--/O NEG Performed at [facility], [address] [place], [place] [address]  [phone]) / Dama 1.09 (12/23 1133) / Varicella Immune Baby is Rh positive, Rhogam to be given before discharge Feeding plan breast, lactation support Encouraged to continue breastfeeding, BF education on latch, position changes, cluster feeding, hunger cues, lactogenesis II, milk supply Education given regarding options for contraception, as well as compatibility with breast feeding if applicable.  Patient plans on tubal ligation for contraception. Continued routine postpartum care  Counseled on normal uterine involution and vaginal bleeding postpartum Spoke to patient's nurse about wound vac, she will look for leak and apply tegaderm over leak, will contact me if not successful Anticipate discharge home tomorrow    Lolita Loots, FNP, CNM [facility] 01/20/2024, 1:41 PM

## 2024-01-20 NOTE — Anesthesia Post-op Follow-up Note (Signed)
  Anesthesia Pain Follow-up Note  Patient: Lauren [name]  Day #: 1  Date of Follow-up: 01/20/2024 Time: 8:18 AM  Last Vitals:  Vitals:   01/20/24 0300 01/20/24 0739  BP:  119/70  Pulse: 70 79  Resp:  18  Temp:  36.6 C  SpO2: 95% 98%    Level of Consciousness: alert  Pain: none   Side Effects:None  Catheter Site Exam:clean  Anti-Coag Meds (From admission, onward)    Start     Dose/Rate Route Frequency Ordered Stop   01/20/24 0800  enoxaparin (LOVENOX) injection 55 mg        0.5 mg/kg  110.3 kg Subcutaneous Every 24 hours 01/19/24 1633          Plan: D/C from anesthesia care at surgeon's request  [place] [facility]

## 2024-01-21 ENCOUNTER — Other Ambulatory Visit: Payer: Self-pay

## 2024-01-21 LAB — RHOGAM INJECTION: Unit division: 0

## 2024-01-21 MED ORDER — ENOXAPARIN SODIUM 60 MG/0.6ML IJ SOSY
0.5000 mg/kg | PREFILLED_SYRINGE | INTRAMUSCULAR | 0 refills | Status: DC
  Filled 2024-01-21: qty 6, 10d supply, fill #0
  Filled 2024-01-31: qty 6, 10d supply, fill #1
  Filled 2024-02-14: qty 18, 30d supply, fill #2

## 2024-01-21 MED ORDER — OXYCODONE HCL 5 MG PO TABS
5.0000 mg | ORAL_TABLET | ORAL | 0 refills | Status: DC | PRN
  Filled 2024-01-21: qty 20, 4d supply, fill #0

## 2024-01-21 NOTE — Discharge Summary (Signed)
 Postpartum Discharge Summary   Patient Name: Lauren [name] DOB: 1980/12/30 MRN: [id number]  Date of admission: 01/19/2024 Delivery date:01/19/2024 Delivering provider: LEIGH SOBER Date of discharge: 01/21/2024  Admitting diagnosis: History of cesarean section complicating pregnancy [O34.219] Supervision of high risk pregnancy, antepartum [O09.90] HTN in pregnancy, chronic [O10.919] History of cesarean delivery, currently pregnant [O34.219] Intrauterine pregnancy: [redacted]w[redacted]d     Secondary diagnosis:  Principal Problem:   History of cesarean delivery, currently pregnant Active Problems:   Obesity affecting pregnancy in third trimester   History of gastric bypass   Obesity, Class III, BMI 40-49.9 (morbid obesity)   Rh negative state in antepartum period   Chronic hypertension affecting pregnancy   Cesarean delivery delivered   Single live birth     Discharge diagnosis: Term Pregnancy Delivered and CHTN                                              Post partum procedures:rhogam Augmentation: N/A Complications: None  Hospital course: Sceduled C/S   43 y.o. yo [id number] at [redacted]w[redacted]d was admitted to the hospital 01/19/2024 for scheduled cesarean section with the following indication:Prior Uterine Surgery and chronic hypertension.Delivery details are as follows:  Membrane Rupture Time/Date: 8:34 AM,01/19/2024  Delivery Method:C-Section, Low Transverse Operative Delivery:N/A Details of operation can be found in separate operative note.  Patient had an uncomplicated postpartum course.  She is ambulating, tolerating a regular diet, passing flatus, and urinating well. Patient is discharged home in stable condition on  01/21/24        Newborn Data: Birth date:01/19/2024 Birth time:8:35 AM Gender:Female Living status:Living Apgars:9 ,9  Weight:3650 g    Magnesium Sulfate received: No BMZ received: No Rhophylac :Yes MMR:N/A T-DaP:Given prenatally Flu: No RSV Vaccine received:  No Transfusion:No Immunizations administered: Immunization History  Administered Date(s) Administered   DTP 09/28/2011   Influenza Split 04/19/2014   Influenza, Seasonal, Injecte, Preservative Fre 05/20/2023   Influenza,inj,Quad PF,6+ Mos 04/29/2021   Influenza-Unspecified 06/06/2015, 05/04/2018   PFIZER(Purple Top)SARS-COV-2 Vaccination 08/14/2019, 09/04/2019   Tdap 09/28/2011, 01/23/2022, 11/10/2023    Physical exam  Vitals:   01/20/24 0739 01/20/24 1509 01/20/24 2300 01/21/24 0733  BP: 119/70 128/73 126/67 115/68  Pulse: 79 83 75 73  Resp: 18 18 17 18   Temp: 97.9 F (36.6 C) 98 F (36.7 C) 98 F (36.7 C) 97.8 F (36.6 C)  TempSrc: Oral Oral Oral Oral  SpO2: 98% 100% 100% 100%  Weight:      Height:       General: alert, cooperative, and no distress Lochia: appropriate Uterine Fundus: firm Incision: Healing well with no significant drainage, No significant erythema, Dressing is clean, dry, and intact DVT Evaluation: No evidence of DVT seen on physical exam. Labs: Lab Results  Component Value Date   WBC 10.0 01/20/2024   HGB 10.8 (L) 01/20/2024   HCT 31.8 (L) 01/20/2024   MCV 94.6 01/20/2024   PLT 146 (L) 01/20/2024      Latest Ref Rng & Units 01/17/2024    9:04 AM  CMP  Glucose 70 - 99 mg/dL 896   BUN 6 - 20 mg/dL 15   Creatinine 9.55 - 1.00 mg/dL 9.48   Sodium 864 - 854 mmol/L 136   Potassium 3.5 - 5.1 mmol/L 3.5   Chloride 98 - 111 mmol/L 108   CO2 22 - 32 mmol/L 17  Calcium 8.9 - 10.3 mg/dL 8.8    Edinburgh Score:    01/19/2024    8:00 PM  Edinburgh Postnatal Depression Scale Screening Tool  I have been able to laugh and see the funny side of things. 0  I have looked forward with enjoyment to things. 0  I have blamed myself unnecessarily when things went wrong. 2  I have been anxious or worried for no good reason. 3  I have felt scared or panicky for no good reason. 2  Things have been getting on top of me. 2  I have been so unhappy that I have  had difficulty sleeping. 2  I have felt sad or miserable. 2  I have been so unhappy that I have been crying. 3  The thought of harming myself has occurred to me. 0  Edinburgh Postnatal Depression Scale Total 16      After visit meds:  Allergies as of 01/21/2024       Reactions   Prozac [fluoxetine]    No help   Zoloft [sertraline]    Zombie        Medication List     TAKE these medications    acetaminophen  500 MG tablet Commonly known as: TYLENOL  Take 1,000 mg by mouth every 6 (six) hours as needed.   buPROPion  300 MG 24 hr tablet Commonly known as: WELLBUTRIN  XL Take 1 tablet (300 mg total) by mouth daily. D/ 150 mg qd   cholecalciferol  25 MCG (1000 UNIT) tablet Commonly known as: VITAMIN D3 Take 1,000 Units by mouth daily.   enoxaparin  60 MG/0.6ML injection Commonly known as: LOVENOX  Inject 0.55 mLs (55 mg total) into the skin daily.   folic acid  800 MCG tablet Commonly known as: FOLVITE  Take 800 mcg by mouth daily.   oxyCODONE  5 MG immediate release tablet Commonly known as: Oxy IR/ROXICODONE  Take 1-2 tablets (5-10 mg total) by mouth every 4 (four) hours as needed for moderate pain (pain score 4-6).   prenatal vitamin w/FE, FA 29-1 MG Chew chewable tablet Chew 2 tablets by mouth daily at 12 noon.         Discharge home in stable condition Infant Feeding: Bottle and Breast Infant Disposition:home with mother Discharge instruction: per After Visit Summary and Postpartum booklet. Activity: Advance as tolerated. Pelvic rest for 6 weeks.  Diet: routine diet Anticipated Birth Control: BTL done with CS Postpartum Appointment:6 weeks Additional Postpartum F/U: Incision check 1 week and BP check 1 week Future Appointments: Future Appointments  Date Time Provider Department Center  01/26/2024  1:15 PM Leigh Sober, MD [facility] None   Follow up Visit:      01/21/2024 [name], CNM

## 2024-01-21 NOTE — Progress Notes (Signed)
 Patient discharged. Discharge instructions given. Patient verbalizes understanding. Transported by axillary.

## 2024-01-21 NOTE — Discharge Instructions (Signed)
Discharge instructions Bleeding: Your bleeding could continue up to 6 weeks, the flow should gradually decrease and the color should become dark then lightened over the next couple of weeks. If you notice you are bleeding heavily or passing clots larger than the size of your fist, PLEASE call your physician. No TAMPONS, DOUCHING, ENEMAS OR SEXUAL INTERCOURSE for 6 weeks. Incision: The honeycomb dressing can be removed in 5-7 days. Remove earlier if any water gets underneath it or if there is a lot of drainage. After the dressing is off, you can let the warm soapy water from the shower run over the incision and pat dry with a clean towel. Watch the incision for signs of infection, such as, redness, warmth, oozing pus. AfterPains: This is the uterus contracting back to its normal position and size. Use medications prescribed or recommended by your physician to help relieve this discomfort. Bowels/Hemorrhoids: Drink plenty of water and stay active. Increase fiber, fresh fruits and vegetables in your diet. Rest/Activity: Rest when the baby is resting;  Do not lift > 10 lbs for 6 weeks. No driving for 1-2 weeks. Bathing: Shower daily! Diet: Continue daily prenatal vitamin and iron until your follow up visit to help replenish nutrients and vitamins. If breastfeeding eat extra calories and increase your fluid intake to 12 glasses a day. Contraception: Consult with your provider on what method of birth control you would like to use. Breastfeeding: You may have a slight fever when your milk comes in, but it should go away on its own. If it does not, and rises above 101.0 please call the doctor. Bottlefeeding: wear a snug fitting bra without underwires continuously for 3-5days, avoid any nipple/breast stimulation. If engorgement occurs, take ibuprofen as prescribed and apply fresh green cabbage leaves directly to your breasts inside the bra cups. Postpartum "BLUES": It is common to emotional days after delivery,  however if it persist for greater than 2 weeks or if you feel concerned please let your physician know immediately. This is hormone driven and nothing you can control so please let someone know how you feel. Follow Up Visit: Please schedule a follow up visit with your delivering provider  Call office if you have any of the following: headache, visual changes, fever >101.0 F, chills, breast concerns, excessive vaginal bleeding, incision drainage or problems, leg pain or redness, depression or any other concerns.  For concerns about your baby, please call your pediatrician For breastfeeding concerns, the lactation consultant can be reached at [phone]

## 2024-01-21 NOTE — TOC Progression Note (Signed)
 Transition of Care [facility]) - Progression Note    Patient Details  Name: Lauren [name] MRN: [id number] Date of Birth: [birth year]  Transition of Care [facility]) CM/SW Contact  Juanya Villavicencio A Mays Paino, RN Phone Number: 01/21/2024, 2:14 PM  Clinical Narrative:     Chart reviewed.  I have meet with Mrs. Goedde at bedside.  I have explained HIPAA rights and I have ask support person to step out of room.  Mrs. Schneider reports that she would like FOBETTER Koren Abu) to stay at bedside.  Mrs. Manard reports that she is ok with discussing anything in front of the support person.  I have informed Mrs. Samonte that I have received referral for Edinburgh Score being 16.    Mrs. Prell confirms that her address and phone number are correct on the demographic sheet.  Mrs. Stamos reports that FOB is Koren Abu.  His contact number is [phone].  Mrs. Djordjevic reports that FOB will provide support on discharge.  Mrs. Oki reports that she works for [employer] and works remotely.    Household members include Mrs. Rincon, FOB ETTER Koren Abu) and her 42 year old son.  Mrs. Scheiber reports that she has WIC.  She plans on breast and bottle feeding.  She reports that she does have a breast pump.    Mrs. Dietzman reports that she has does have a hx of mental hx( per chart patient has a hx of mood disorder, obsessive-compulsive disorder, Central auditing processing disorder, anxiety and Depression, major, single episode, moderate.  Mrs. Fedorchak reports that she is on Wellbutrin  XR and currently does not receive therapy/counseling.  She is open to resources for Mental Health Therapy and Counseling.  I have provided typed information for Mental Health and Counseling Resources.  Mrs. Spurrier denies any Suicidal or Homicidal thoughts.  She also denies any domestic violence.    I have reviewed Postpartum Depression, Sudden Infant Death Syndrome, and Car seat safety.  I have provided typed information on the above  information.  Patient verbalizes understanding.  I have also provided Postpartum Progress Sheet for patient.    Mrs. Fader reports that she will use [facility] for baby's Pediatrician.   Mrs.Meeker reports that she has a crib, bassinet, pack and play, diapers, clothes, and car seat for her baby.  She reports that she has transportation for her baby and herself at discharge.    Mrs. Shindler reports no other concerns.    I have informed staff nurse of the above information.         Expected Discharge Plan and Services         Expected Discharge Date: 01/21/24                                     Social Determinants of Health (SDOH) Interventions SDOH Screenings   Food Insecurity: No Food Insecurity (01/19/2024)  Housing: Low Risk  (01/19/2024)  Transportation Needs: No Transportation Needs (01/19/2024)  Utilities: Not At Risk (01/19/2024)  Alcohol Screen: Low Risk  (10/04/2019)  Depression (PHQ2-9): High Risk (07/19/2023)  Financial Resource Strain: Low Risk  (06/23/2023)  Physical Activity: Insufficiently Active (06/23/2023)  Social Connections: Socially Integrated (01/19/2024)  Stress: Stress Concern Present (06/23/2023)  Tobacco Use: Low Risk  (01/19/2024)  Health Literacy: Adequate Health Literacy (06/23/2023)    Readmission Risk Interventions     No data to display

## 2024-01-21 NOTE — Lactation Note (Signed)
 This note was copied from a baby's chart. Lactation Consultation Note  Patient Name: Boy Avriana Joo Unijb'd Date: 01/21/2024 Age:43 hours Reason for consult: Follow-up assessment;Term;Other (Comment) (Discharge Education)   Maternal Data Follow up assessment and discharge education w/ a 40hr old baby boy and mom.  Mom stated that she only did 1 pump session over night but also offered infant formula as well.  Feeding Mother's Current Feeding Choice: Breast Milk and Formula  Interventions Interventions: Breast feeding basics reviewed;Education  Discharge Discharge Education: Engorgement and breast care;Warning signs for feeding baby;Outpatient recommendation  Education on engorgement prevention/treatment was discussed as well as breastmilk storage guidelines.  LC provided patient with a handout on breastmilk storage guidelines from [facility]. [facility] outpatient lactation services phone number written on the white board in the room.  Patient verbalized understanding.  LC also provided education from the postpartum book about warning signs to look for in a poor feeding.    Consult Status Consult Status: Complete Follow-up type: Call as needed    Christyann Manolis S Staci Carver 01/21/2024, 10:00 AM

## 2024-01-24 ENCOUNTER — Encounter: Payer: Self-pay | Admitting: Obstetrics

## 2024-01-24 NOTE — Telephone Encounter (Signed)
 Should se be seen or is this normal?

## 2024-01-24 NOTE — Telephone Encounter (Signed)
Pt aware.

## 2024-01-24 NOTE — Progress Notes (Unsigned)
   Postoperative Cesarean Follow-up Visit GRACYNN RAJEWSKI is a 43 y.o. [phone] s/p rLTCS w/BTL at [redacted]w[redacted]d for repeat in setting of chronic hypertension, POD#7, here today for incision check. Wound vac stopped last night, and is having some R leg/foot swelling.   Subjective: Pain is controlled without any medications. She denies fever, chills, nausea. Eating a regular diet without difficulty.  Is having regular bowel movements. Activity: normal activities of daily living. She has soreness with her incision when moving. Also concerned about R LE swelling. Felt like the ON-Q helped with her post-op pain immensely. Took out at home without issues.   Objective: BP 116/69 (BP Location: Left Arm, Patient Position: Sitting, Cuff Size: Large)   Pulse 73   Ht 5' 2 (1.575 m)   Wt 229 lb 14.4 oz (104.3 kg)   Breastfeeding Yes   BMI 42.05 kg/m  Body mass index is 42.05 kg/m.  General:  alert and no distress  Abdomen: soft, bowel sounds active, non-tender  Incision:   healing well, no drainage, no erythema, no hernia, no seroma, no swelling, no dehiscence, incision well approximated    Assessment/Plan: CORTANA VANDERFORD is a 43 y.o. [phone] s/p rLTCS w/BTL at [redacted]w[redacted]d for repeat w/cHTN, POD#7, here today for incision check, healing well. No concerns with incision today, wound vac and remaining steri-strips removed and replaced. R leg swelling negligable today on exam and appropriate, likely due to positioning. Reiterated si/sx of DVT.  -Discussed at-home care, healing expectations, si/sx of infection.  -Call clinic if developing redness, discharge, or increasing pain. -Avoid vigorous scrubbing/washing of incision site; hygiene reviewed. -May trim back steri-strips if they peel; should fully remove after 1 week from today. -May resume driving and light walking. Still no heavy lifting >10-12 lbs and pelvic rest advised until cleared at 6wk postpartum visit.  -If stooling regularly x 1-2 weeks, can  take stool softener every other day x 1 week, taper as tolerated.  Return in about 5 weeks (around 03/01/2024) for 6wk PPV.   Estil Mangle, DO [facility]

## 2024-01-26 ENCOUNTER — Ambulatory Visit (INDEPENDENT_AMBULATORY_CARE_PROVIDER_SITE_OTHER): Admitting: Obstetrics

## 2024-01-27 ENCOUNTER — Other Ambulatory Visit: Payer: Self-pay

## 2024-01-31 ENCOUNTER — Other Ambulatory Visit: Payer: Self-pay

## 2024-02-02 ENCOUNTER — Other Ambulatory Visit: Payer: Self-pay

## 2024-02-04 ENCOUNTER — Other Ambulatory Visit: Payer: Self-pay | Admitting: Family Medicine

## 2024-02-04 DIAGNOSIS — I10 Essential (primary) hypertension: Secondary | ICD-10-CM

## 2024-02-08 ENCOUNTER — Other Ambulatory Visit: Payer: Self-pay | Admitting: Family

## 2024-02-08 DIAGNOSIS — F411 Generalized anxiety disorder: Secondary | ICD-10-CM

## 2024-02-08 MED ORDER — BUPROPION HCL ER (XL) 300 MG PO TB24: 300.0000 mg | ORAL_TABLET | Freq: Every day | ORAL | 0 refills | Status: DC

## 2024-02-11 ENCOUNTER — Encounter: Payer: Self-pay | Admitting: Obstetrics

## 2024-02-14 ENCOUNTER — Other Ambulatory Visit: Payer: Self-pay

## 2024-02-14 ENCOUNTER — Telehealth: Payer: Self-pay

## 2024-02-14 NOTE — Telephone Encounter (Signed)
 Na

## 2024-02-29 ENCOUNTER — Ambulatory Visit: Admitting: Family

## 2024-02-29 NOTE — Progress Notes (Signed)
   OBSTETRICS POSTPARTUM CLINIC PROGRESS NOTE  Subjective:     Lauren [name] is a 43 y.o. [phone] female who presents for a postpartum visit. She is 7 weeks postpartum following a low cervical transverse Cesarean section with BTL. I have reviewed the prenatal and intrapartum course. The delivery was at 38 gestational weeks.  Anesthesia: spinal. Postpartum course has been uncomplicated. Baby's course has been uncomplicated. Baby is feeding by bottle - Similac total comfort. Bleeding: patient has not not resumed menses, with No LMP recorded (lmp unknown).. Bowel function is normal. Bladder function is normal. Patient is not sexually active. Contraception method desired is tubal ligation. Postpartum depression screening: positive.  EDPS score is 11.    The following portions of the patient's history were reviewed and updated as appropriate: allergies, current medications, past family history, past medical history, past social history, past surgical history, and problem list.  Review of Systems Pertinent items are noted in HPI.   Objective:    BP 111/68   Pulse 76   Ht 5' 2 (1.575 m)   Wt 221 lb 9.6 oz (100.5 kg)   LMP  (LMP Unknown)   Breastfeeding No   BMI 40.53 kg/m   General:  alert and no distress   Breasts:  inspection negative, no nipple discharge or bleeding, no masses or nodularity palpable  Lungs: clear to auscultation bilaterally  Heart:  regular rate and rhythm, S1, S2 normal, no murmur, click, rub or gallop  Abdomen: soft, non-tender; bowel sounds normal; no masses,  no organomegaly.  Well healed Pfannenstiel incision   Vulva:  normal  Vagina: normal vagina, no discharge, exudate, lesion, or erythema  Cervix:  no cervical motion tenderness and no lesions  Corpus: normal size, contour, position, consistency, mobility, non-tender  Adnexa:  normal adnexa and no mass, fullness, tenderness  Rectal Exam: Not performed.         Labs:  Lab Results  Component Value Date    HGB 10.8 (L) 01/20/2024     Assessment:   1. Postpartum care following cesarean delivery   2. Generalized anxiety disorder      Plan:  Lauren [name] is a 43 y.o. [phone] s/p rLTCS w/BTL at [redacted]w[redacted]d for repeat w/cHTN, no complications. Today is doing well. EPDS=11, hx of anxiety on Wellbutrin  300mg  daily and desiring increase for the next little while.  -Contraception: s/p BTL -Increase Wellbutrin  to 450mg  daily, new Rx sent. Stay on this dose for minimum of 3 mos, may continue if desires.  -Okay to resume all regular activity as tolerated-link to postpartum exercises in AVS today. Do exercises 3-5 times weekly, daily if able.  -Reviewed returning to intercourse expectations. -Hair loss and pelvic floor function expectations discussed. -Continue PNV as daily multivitamin. -Moods reviewed, alert clinic if developing PPD/A -RTC for WWE in 12 months, sooner if concerns.   Estil Mangle, DO [facility]

## 2024-03-08 ENCOUNTER — Ambulatory Visit (INDEPENDENT_AMBULATORY_CARE_PROVIDER_SITE_OTHER): Admitting: Obstetrics

## 2024-03-08 DIAGNOSIS — F411 Generalized anxiety disorder: Secondary | ICD-10-CM

## 2024-03-08 MED ORDER — BUPROPION HCL ER (XL) 450 MG PO TB24: 450.0000 mg | ORAL_TABLET | Freq: Every day | ORAL | 1 refills | Status: DC

## 2024-03-09 ENCOUNTER — Encounter: Payer: Self-pay | Admitting: Obstetrics

## 2024-03-14 ENCOUNTER — Encounter: Payer: Self-pay | Admitting: Obstetrics

## 2024-03-14 ENCOUNTER — Other Ambulatory Visit: Payer: Self-pay | Admitting: Obstetrics

## 2024-03-14 DIAGNOSIS — F411 Generalized anxiety disorder: Secondary | ICD-10-CM

## 2024-03-14 MED ORDER — BUPROPION HCL ER (XL) 150 MG PO TB24: 450.0000 mg | ORAL_TABLET | Freq: Every day | ORAL | 1 refills | Status: AC

## 2024-03-14 NOTE — Progress Notes (Signed)
 New Rx for Wellbutrin  150XL  3 tabs daily, insurance not covering the 450mg  tab.

## 2024-03-15 NOTE — Telephone Encounter (Signed)
Already sent in, duplicate

## 2024-04-03 ENCOUNTER — Encounter: Payer: Self-pay | Admitting: Family

## 2024-04-03 ENCOUNTER — Ambulatory Visit (INDEPENDENT_AMBULATORY_CARE_PROVIDER_SITE_OTHER): Admitting: Family

## 2024-04-03 VITALS — BP 118/78 | HR 88 | Temp 98.2°F | Resp 20 | Ht 62.0 in | Wt 223.1 lb

## 2024-04-03 DIAGNOSIS — M7989 Other specified soft tissue disorders: Secondary | ICD-10-CM | POA: Diagnosis not present

## 2024-04-03 DIAGNOSIS — R899 Unspecified abnormal finding in specimens from other organs, systems and tissues: Secondary | ICD-10-CM

## 2024-04-03 LAB — CBC WITH DIFFERENTIAL/PLATELET
Basophils Absolute: 0 K/uL (ref 0.0–0.1)
Basophils Relative: 0.8 % (ref 0.0–3.0)
Eosinophils Absolute: 0.1 K/uL (ref 0.0–0.7)
Eosinophils Relative: 1.4 % (ref 0.0–5.0)
HCT: 39.1 % (ref 36.0–46.0)
Hemoglobin: 13.1 g/dL (ref 12.0–15.0)
Lymphocytes Relative: 31.6 % (ref 12.0–46.0)
Lymphs Abs: 1.4 K/uL (ref 0.7–4.0)
MCHC: 33.5 g/dL (ref 30.0–36.0)
MCV: 91.3 fl (ref 78.0–100.0)
Monocytes Absolute: 0.3 K/uL (ref 0.1–1.0)
Monocytes Relative: 6.8 % (ref 3.0–12.0)
Neutro Abs: 2.7 K/uL (ref 1.4–7.7)
Neutrophils Relative %: 59.4 % (ref 43.0–77.0)
Platelets: 205 K/uL (ref 150.0–400.0)
RBC: 4.28 Mil/uL (ref 3.87–5.11)
RDW: 14.6 % (ref 11.5–15.5)
WBC: 4.6 K/uL (ref 4.0–10.5)

## 2024-04-03 LAB — BASIC METABOLIC PANEL WITH GFR
BUN: 16 mg/dL (ref 6–23)
CO2: 25 meq/L (ref 19–32)
Calcium: 9.2 mg/dL (ref 8.4–10.5)
Chloride: 106 meq/L (ref 96–112)
Creatinine, Ser: 0.71 mg/dL (ref 0.40–1.20)
GFR: 104.12 mL/min (ref >60.00)
Glucose, Bld: 81 mg/dL (ref 70–99)
Potassium: 3.9 meq/L (ref 3.5–5.1)
Sodium: 141 meq/L (ref 135–145)

## 2024-04-03 MED ORDER — HYDROCHLOROTHIAZIDE 12.5 MG PO CAPS
12.5000 mg | ORAL_CAPSULE | Freq: Every day | ORAL | 3 refills | Status: AC | PRN
Start: 2024-04-03 — End: ?

## 2024-04-03 NOTE — Assessment & Plan Note (Signed)
 Chronic, stable.  Discussed conservative measures including zip up compression stockings, low-sodium diet and elevation.  Counseled against daily use of hydrochlorothiazide  due to low in blood pressure and need for requiring for monitoring of electrolytes, kidney function.  Advocated for more of a lifestyle approach.  She has been on hydrochlorothiazide  for years now.  I did provide her a refill to use as needed, sparingly.  Labs today in 1 week.

## 2024-04-03 NOTE — Progress Notes (Signed)
 Assessment & Plan:  Leg swelling Assessment & Plan: Chronic, stable.  Discussed conservative measures including zip up compression stockings, low-sodium diet and elevation.  Counseled against daily use of hydrochlorothiazide  due to low in blood pressure and need for requiring for monitoring of electrolytes, kidney function.  Advocated for more of a lifestyle approach.  She has been on hydrochlorothiazide  for years now.  I did provide her a refill to use as needed, sparingly.  Labs today in 1 week.  Orders: -     Basic metabolic panel with GFR -     Basic metabolic panel with GFR; Future -     hydroCHLOROthiazide ; Take 1 capsule (12.5 mg total) by mouth daily as needed (leg swelling).  Dispense: 30 capsule; Refill: 3  Abnormal laboratory test -     CBC with Differential/Platelet -     Iron, TIBC and Ferritin Panel     Return precautions given.   Risks, benefits, and alternatives of the medications and treatment plan prescribed today were discussed, and patient expressed understanding.   Education regarding symptom management and diagnosis given to patient on AVS either electronically or printed.  Return in about 6 months (around 10/01/2024).  Rollene Northern, FNP  Subjective:    Patient ID: Lauren [name], female    DOB: 1981/07/26, 43 y.o.   MRN: [id number]  CC: Lauren [name] is a 43 y.o. female who presents today for follow up.   HPI: Accompanied by infant son Here today to follow-up and request a refill of hydrochlorothiazide  12.5 mg.  In the past she has used this for bilateral leg swelling for years.  During pregnancy blood pressure was well-controlled without medication.  During postpartum, blood pressure has not required medication.  Bilateral leg swelling worse at the end of the day.  Denies shortness of breath, orthopnea, chest pain, calf pain.   C/s 01/19/24  Following with Dr Leigh; started on wellbutrin  02/27/24; increased to 450mg .   GFR > 60  12/27/23  She  has been on hydrochlorothiazide  since 2021  Allergies: Prozac [fluoxetine] and Zoloft [sertraline] Current Outpatient Medications on File Prior to Visit  Medication Sig Dispense Refill   buPROPion  (WELLBUTRIN  XL) 150 MG 24 hr tablet Take 3 tablets (450 mg total) by mouth daily. 270 tablet 1   prenatal vitamin w/FE, FA (NATACHEW) 29-1 MG CHEW chewable tablet Chew 2 tablets by mouth daily at 12 noon.     No current facility-administered medications on file prior to visit.    Review of Systems  Constitutional:  Negative for chills and fever.  Respiratory:  Negative for cough and shortness of breath.   Cardiovascular:  Positive for leg swelling. Negative for chest pain and palpitations.  Gastrointestinal:  Negative for nausea and vomiting.      Objective:    BP 118/78   Pulse 88   Temp 98.2 F (36.8 C)   Resp 20   Ht 5' 2 (1.575 m)   Wt 223 lb 2 oz (101.2 kg)   LMP 03/29/2024   SpO2 98%   Breastfeeding No   BMI 40.81 kg/m  BP Readings from Last 3 Encounters:  04/03/24 118/78  03/08/24 111/68  01/26/24 116/69   Wt Readings from Last 3 Encounters:  04/03/24 223 lb 2 oz (101.2 kg)  03/08/24 221 lb 9.6 oz (100.5 kg)  01/26/24 229 lb 14.4 oz (104.3 kg)    Physical Exam Vitals reviewed.  Constitutional:      Appearance: She is well-developed.  Eyes:  Conjunctiva/sclera: Conjunctivae normal.  Cardiovascular:     Rate and Rhythm: Normal rate and regular rhythm.     Pulses: Normal pulses.     Heart sounds: Normal heart sounds.     Comments: No LE edema.  Questionable trace pedal edema on after patient removes shoes with line is present on top of foot.  No palpable cords or masses of calf. No erythema or increased warmth. No asymmetry in calf size when compared bilaterally LE hair growth symmetric and present. No discoloration or varicosities noted. LE warm and palpable pedal pulses.  Pulmonary:     Effort: Pulmonary effort is normal.     Breath sounds: Normal breath  sounds. No wheezing, rhonchi or rales.  Musculoskeletal:     Right lower leg: No edema.     Left lower leg: No edema.  Skin:    General: Skin is warm and dry.  Neurological:     Mental Status: She is alert.  Psychiatric:        Speech: Speech normal.        Behavior: Behavior normal.        Thought Content: Thought content normal.

## 2024-04-03 NOTE — Patient Instructions (Signed)
 As discussed, elevation of legs, zip up compression stockings, low-sodium diet are first-line measures for bilateral leg swelling.  You may use hydrochlorothiazide  (diuretic) sparingly as it can cause electrolyte abnormalities, reduction in renal function. Congratulations on baby!

## 2024-04-04 ENCOUNTER — Ambulatory Visit: Payer: Self-pay | Admitting: Family

## 2024-04-04 LAB — IRON,TIBC AND FERRITIN PANEL
%SAT: 26 % (ref 16–45)
Ferritin: 12 ng/mL — ABNORMAL LOW (ref 16–232)
Iron: 86 ug/dL (ref 40–190)
TIBC: 336 ug/dL (ref 250–450)

## 2024-04-18 ENCOUNTER — Other Ambulatory Visit

## 2024-05-06 ENCOUNTER — Other Ambulatory Visit: Payer: Self-pay | Admitting: Family

## 2024-05-06 DIAGNOSIS — F411 Generalized anxiety disorder: Secondary | ICD-10-CM

## 2024-05-10 NOTE — Telephone Encounter (Signed)
 LVM to call back to inquire regarding message below per Rollene

## 2024-05-11 NOTE — Telephone Encounter (Signed)
 LVM for pt to call office back regarding this medication she may or may not be taking.

## 2024-05-17 NOTE — Telephone Encounter (Signed)
 Called pt in regards to this medication she stated Dr. Leigh is refilling her medication no need to continue refilling on our end at this time

## 2024-07-20 ENCOUNTER — Encounter: Payer: Self-pay | Admitting: Family

## 2024-08-13 ENCOUNTER — Emergency Department

## 2024-08-13 ENCOUNTER — Emergency Department
Admission: EM | Admit: 2024-08-13 | Discharge: 2024-08-13 | Disposition: A | Discharge status: Home / Self Care | Attending: Emergency Medicine | Admitting: Emergency Medicine

## 2024-08-13 ENCOUNTER — Other Ambulatory Visit: Payer: Self-pay

## 2024-08-13 DIAGNOSIS — R519 Headache, unspecified: Secondary | ICD-10-CM | POA: Diagnosis not present

## 2024-08-13 DIAGNOSIS — R000 Tachycardia, unspecified: Secondary | ICD-10-CM | POA: Insufficient documentation

## 2024-08-13 DIAGNOSIS — I10 Essential (primary) hypertension: Secondary | ICD-10-CM | POA: Insufficient documentation

## 2024-08-13 DIAGNOSIS — R079 Chest pain, unspecified: Secondary | ICD-10-CM | POA: Insufficient documentation

## 2024-08-13 DIAGNOSIS — R197 Diarrhea, unspecified: Secondary | ICD-10-CM | POA: Insufficient documentation

## 2024-08-13 DIAGNOSIS — R002 Palpitations: Secondary | ICD-10-CM | POA: Insufficient documentation

## 2024-08-13 LAB — BASIC METABOLIC PANEL WITH GFR
Anion gap: 14 (ref 5–15)
BUN: 12 mg/dL (ref 6–20)
CO2: 23 mmol/L (ref 22–32)
Calcium: 9.3 mg/dL (ref 8.9–10.3)
Chloride: 106 mmol/L (ref 98–111)
Creatinine, Ser: 0.75 mg/dL (ref 0.44–1.00)
GFR, Estimated: >60 mL/min
Glucose, Bld: 94 mg/dL (ref 70–99)
Potassium: 3.2 mmol/L — ABNORMAL LOW (ref 3.5–5.1)
Sodium: 143 mmol/L (ref 135–145)

## 2024-08-13 LAB — TROPONIN T, HIGH SENSITIVITY
Troponin T High Sensitivity: <15 ng/L (ref 0–19)
Troponin T High Sensitivity: <15 ng/L (ref 0–19)

## 2024-08-13 LAB — D-DIMER, QUANTITATIVE: D-Dimer, Quant: <0.27 ug{FEU}/mL (ref 0.00–0.50)

## 2024-08-13 LAB — CBC
HCT: 39.9 % (ref 36.0–46.0)
Hemoglobin: 13.8 g/dL (ref 12.0–15.0)
MCH: 31.5 pg (ref 26.0–34.0)
MCHC: 34.6 g/dL (ref 30.0–36.0)
MCV: 91.1 fL (ref 80.0–100.0)
Platelets: 199 K/uL (ref 150–400)
RBC: 4.38 MIL/uL (ref 3.87–5.11)
RDW: 12.1 % (ref 11.5–15.5)
WBC: 6 K/uL (ref 4.0–10.5)
nRBC: 0 % (ref 0.0–0.2)

## 2024-08-13 LAB — MAGNESIUM: Magnesium: 2 mg/dL (ref 1.7–2.4)

## 2024-08-13 MED ORDER — LACTATED RINGERS IV BOLUS
1000.0000 mL | Freq: Once | INTRAVENOUS | Status: AC
  Administered 2024-08-13: 1000 mL via INTRAVENOUS

## 2024-08-13 MED ORDER — POTASSIUM CHLORIDE 20 MEQ PO PACK
40.0000 meq | PACK | Freq: Once | ORAL | Status: AC
  Administered 2024-08-13: 40 meq via ORAL
  Filled 2024-08-13: qty 2

## 2024-08-13 NOTE — ED Triage Notes (Signed)
 Pt reports for the past 3 days she has had a headache and palpitations. Pt denies known cardiac hx. Pt denies cough congestion or fever.

## 2024-08-13 NOTE — ED Provider Notes (Signed)
 "  [facility] Provider Note    Event Date/Time   First MD Initiated Contact with Patient 08/13/24 2007     (approximate)   History   Palpitations   HPI  Lauren [name] is a 44 y.o. female past medical history significant for leg swelling, hypertension, history of gastric sleeve, presents to the emergency department for heart palpitations.  States that over the past couple of days she felt like her heart is racing.  Denies any nausea, vomiting or diarrhea.  No cough or congestion.  Some intermittent chest discomfort but no active chest pain at this time.  No shortness of breath.  States that she intermittently has headaches but no headache at this time.  No change in vision.  States that she always has diarrhea.  No blood in her stool or melanotic stool.  No dysuria, urinary urgency or frequency.  C-section and tubal ligation in June.  No recent travel.  No history of DVT or PE.  Denies any significant caffeine use over the past couple of days and states that she has decreased it.  Normal urine output.     Physical Exam   Triage Vital Signs: ED Triage Vitals  Encounter Vitals Group     BP 08/13/24 2002 (!) 161/100     Girls Systolic BP Percentile --      Girls Diastolic BP Percentile --      Boys Systolic BP Percentile --      Boys Diastolic BP Percentile --      Pulse Rate 08/13/24 2002 (!) 108     Resp 08/13/24 2002 18     Temp 08/13/24 2002 98.5 F (36.9 C)     Temp src --      SpO2 --      Weight 08/13/24 2000 214 lb (97.1 kg)     Height 08/13/24 2000 5' 2 (1.575 m)     Head Circumference --      Peak Flow --      Pain Score 08/13/24 2000 0     Pain Loc --      Pain Education --      Exclude from Growth Chart --     Most recent vital signs: Vitals:   08/13/24 2002 08/13/24 2002  BP: (!) 161/100   Pulse:  (!) 108  Resp:  18  Temp:  98.5 F (36.9 C)    Physical Exam Constitutional:      Appearance: She is well-developed.  HENT:      Head: Atraumatic.     Mouth/Throat:     Mouth: Mucous membranes are moist.  Eyes:     Extraocular Movements: Extraocular movements intact.     Conjunctiva/sclera: Conjunctivae normal.     Pupils: Pupils are equal, round, and reactive to light.  Cardiovascular:     Rate and Rhythm: Regular rhythm. Tachycardia present.     Pulses: Normal pulses.     Heart sounds: No murmur heard. Pulmonary:     Effort: No respiratory distress.     Breath sounds: Normal breath sounds. No wheezing.  Abdominal:     General: There is no distension.     Tenderness: There is no abdominal tenderness.  Musculoskeletal:        General: Normal range of motion.     Cervical back: Normal range of motion.     Right lower leg: No edema.     Left lower leg: No edema.  Skin:    General:  Skin is warm.     Capillary Refill: Capillary refill takes less than 2 seconds.  Neurological:     General: No focal deficit present.     Mental Status: She is alert. Mental status is at baseline.     IMPRESSION / MDM / ASSESSMENT AND PLAN / ED COURSE  I reviewed the triage vital signs and the nursing notes.  Differential diagnosis including sinus tachycardia, dysrhythmia, electrolyte abnormality, dehydration, anemia, pneumonia  No significant chest pain at this time.  Low suspicion for pulmonary embolism, low risk Wells criteria, not PERC negative given her tachycardia of 108.   EKG  I, Clotilda Punter, the attending physician, personally viewed and interpreted this ECG.  EKG showed sinus tachycardia, narrow complex, normal intervals.  No significant ST elevation or depression.  No findings of acute ischemia or dysrhythmia.  Sinus tachycardia while on cardiac telemetry.  RADIOLOGY I independently reviewed imaging, my interpretation of imaging: Chest x-ray no signs of pneumonia  LABS (all labs ordered are listed, but only abnormal results are displayed) Labs interpreted as -    Labs Reviewed  BASIC METABOLIC  PANEL WITH GFR - Abnormal; Notable for the following components:      Result Value   Potassium 3.2 (*)    All other components within normal limits  CBC  MAGNESIUM  D-DIMER, QUANTITATIVE  POC URINE PREG, ED  TROPONIN T, HIGH SENSITIVITY  TROPONIN T, HIGH SENSITIVITY     MDM  We have 1 L of LR and reevaluate  No significant leukocytosis or anemia.  Normal platelets.  Creatinine is at her baseline.  No significant electrolyte abnormality.  Normal glucose at 94.  Potassium mildly low at 3.2.  Normal magnesium level.  Given oral potassium replacement given her palpitations.  Low risk Wells criteria, D-dimer is undetectable, have a very low suspicion for pulmonary embolism.  On reevaluation patient with normal heart rate in the 80s.  No longer feeling like she is having heart palpitations.  Normal blood pressure on repeat.  No significant orthostatic hypotension.  Discussed close follow-up as an outpatient to discharge home.  Given referral for cardiology for heart palpitations for questionable Zio patch.  Patient did have heart palpitations while in the emergency department was in normal sinus rhythm.  No questions or concerns at time of discharge.  Discussed return for any ongoing or worsening symptoms.     PROCEDURES:  Critical Care performed: No  Procedures  Patient's presentation is most consistent with acute presentation with potential threat to life or bodily function.   MEDICATIONS ORDERED IN ED: Medications  lactated ringers  bolus 1,000 mL (0 mLs Intravenous Stopped 08/13/24 2228)  potassium chloride  (KLOR-CON ) packet 40 mEq (40 mEq Oral Given 08/13/24 2225)    FINAL CLINICAL IMPRESSION(S) / ED DIAGNOSES   Final diagnoses:  Palpitations     Rx / DC Orders   ED Discharge Orders          Ordered    Ambulatory referral to Cardiology       Comments: If you have not heard from the Cardiology office within the next 72 hours please call [phone].   08/13/24 2318              Note:  This document was prepared using Dragon voice recognition software and may include unintentional dictation errors.   Punter Clotilda, MD 08/13/24 2321  "

## 2024-08-14 ENCOUNTER — Encounter: Payer: Self-pay | Admitting: Family

## 2024-08-14 ENCOUNTER — Other Ambulatory Visit: Payer: Self-pay | Admitting: Family

## 2024-08-14 ENCOUNTER — Telehealth: Payer: Self-pay | Admitting: *Deleted

## 2024-08-14 DIAGNOSIS — R002 Palpitations: Secondary | ICD-10-CM

## 2024-08-14 NOTE — Telephone Encounter (Signed)
 Copied from CRM [id number]. Topic: Appointments - Scheduling Inquiry for Clinic >> Aug 14, 2024 12:23 PM Macario HERO wrote: Reason for CRM: Patient called to schedule a hospital follow up appointment regarding heart papillations. Scheduled patient for earliest appointment 1/28. She stated she is still experiencing it and would like to be seen sooner. Advised clinic will follow up.

## 2024-08-14 NOTE — Progress Notes (Signed)
 close

## 2024-08-14 NOTE — Telephone Encounter (Signed)
 Pt scheduled to see Chelsea Aurora, NP on 08/17/24 @ 3:40p.  Pt reports that she is still having palpitations.  ED precautions given, encouraged her if anything changes or worsens before appointment she should go to ED for evaluation.  This is the soonest appointment we can schedule for pt.

## 2024-08-14 NOTE — Telephone Encounter (Signed)
 See mychart message for documentation

## 2024-08-15 ENCOUNTER — Ambulatory Visit: Attending: Family

## 2024-08-15 ENCOUNTER — Ambulatory Visit

## 2024-08-15 ENCOUNTER — Other Ambulatory Visit: Payer: Self-pay

## 2024-08-15 VITALS — BP 120/86 | HR 84 | Ht 62.0 in | Wt 218.0 lb

## 2024-08-15 DIAGNOSIS — I479 Paroxysmal tachycardia, unspecified: Secondary | ICD-10-CM

## 2024-08-15 DIAGNOSIS — R079 Chest pain, unspecified: Secondary | ICD-10-CM

## 2024-08-15 DIAGNOSIS — R002 Palpitations: Secondary | ICD-10-CM

## 2024-08-15 DIAGNOSIS — E876 Hypokalemia: Secondary | ICD-10-CM

## 2024-08-15 DIAGNOSIS — Z79899 Other long term (current) drug therapy: Secondary | ICD-10-CM | POA: Diagnosis not present

## 2024-08-15 MED ORDER — METOPROLOL TARTRATE 100 MG PO TABS: ORAL_TABLET | ORAL | 0 refills | Status: AC

## 2024-08-15 NOTE — Telephone Encounter (Signed)
 LVM to call back to discuss message below per Rollene

## 2024-08-15 NOTE — Telephone Encounter (Signed)
 Spoke to pt she has seen Cardiology already today 08/15/24 they rechecked her thyroid  while she was there and scheduled Echocardiogram for Thursday, and she has to schedule A Coronary CT as well. Did inform her of Zio monitor explained to her how to put on and that directions were at bottom, if she had any questions she was welcomed to call e back or send my chart message as well.

## 2024-08-15 NOTE — Patient Instructions (Signed)
 Medication Instructions:  Your physician recommends that you continue on your current medications as directed. Please refer to the Current Medication list given to you today.  *If you need a refill on your cardiac medications before your next appointment, please call your pharmacy*  Lab Work: Your provider would like for you to have following labs drawn today BMP.   If you have labs (blood work) drawn today and your tests are completely normal, you will receive your results only by: MyChart Message (if you have MyChart) OR A paper copy in the mail If you have any lab test that is abnormal or we need to change your treatment, we will call you to review the results.  Testing/Procedures: Your physician has requested that you have an echocardiogram. Echocardiography is a painless test that uses sound waves to create images of your heart. It provides your doctor with information about the size and shape of your heart and how well your hearts chambers and valves are working.   You may receive an ultrasound enhancing agent through an IV if needed to better visualize your heart during the echo. This procedure takes approximately one hour.  There are no restrictions for this procedure.  This will take place at [address] [facility] [address], [place] [address]  Please note: We ask at that you not bring children with you during ultrasound (echo/ vascular) testing. Due to room size and safety concerns, children are not allowed in the ultrasound rooms during exams. Our front office staff cannot provide observation of children in our lobby area while testing is being conducted. An adult accompanying a patient to their appointment will only be allowed in the ultrasound room at the discretion of the ultrasound technician under special circumstances. We apologize for any inconvenience.   CORONARY CT SCAN:  Your cardiac CT will be scheduled at one of the below locations:   [facility] [address] [place], [place] [address] [phone]  Please arrive 15 mins early for check-in and test prep.  There is spacious parking and easy access to the radiology department from the [facility] entrance. Please enter here and check-in with the desk attendant.   Please follow these instructions carefully (unless otherwise directed):  An IV will be required for this test and Nitroglycerin will be given.    On the Night Before the Test: Be sure to Drink plenty of water. 12 HOURS PRIOR TO TEST : Do not consume any caffeinated/decaffeinated beverages or chocolate  12 HOURS PRIOR TO TEST: Do not take any antihistamines 12 hours   On the Day of the Test: Drink plenty of water until 1 hour prior to the test. Do not eat any food 1 hour prior to test. Take metoprolol  (Lopressor ) 100 mg two hours prior to test. (ONE TIME DOSE) Please HOLD hydrochlorothiazide  (Microzide ) on the morning of the test. You may take your remaining regular medications prior to the test.  FEMALES- please wear underwire-free bra if available, avoid dresses & tight clothing  After the Test: Drink plenty of water. After receiving IV contrast, you may experience a mild flushed feeling. This is normal. On occasion, you may experience a mild rash up to 24 hours after the test. This is not dangerous. If this occurs, you can take Benadryl  25 mg, Zyrtec, Claritin, or Allegra and increase your fluid intake. (Patients taking Tikosyn should avoid Benadryl , and may take Zyrtec, Claritin, or Allegra) If you experience trouble breathing, this  can be serious. If it is severe call 911 IMMEDIATELY. If it is mild, please call our office.  We will call to schedule your test 2-4 weeks out understanding that some insurance companies will need an authorization prior to the service being performed.   For more information and frequently asked questions, please visit our website :  [email or web address]  For non-scheduling related questions, please contact the cardiac imaging nurse navigator should you have any questions/concerns: Cardiac Imaging Nurse Navigators Direct Office Dial: [phone]   For scheduling needs, including cancellations and rescheduling, please call Brittany, [phone].    HEART MONITOR:  PLACE HEART MONITOR ON THAT WAS ORDERED FROM PRIMARY CARE PHYSICIAN   Follow-Up: At [facility], you and your health needs are our priority.  As part of our continuing mission to provide you with exceptional heart care, our providers are all part of one team.  This team includes your primary Cardiologist (physician) and Advanced Practice Providers or APPs (Physician Assistants and Nurse Practitioners) who all work together to provide you with the care you need, when you need it.  Your next appointment:  3 month(s)  Provider:  Caron Poser, MD    We recommend signing up for the patient portal called MyChart.  Sign up information is provided on this After Visit Summary.  MyChart is used to connect with patients for Virtual Visits (Telemedicine).  Patients are able to view lab/test results, encounter notes, upcoming appointments, etc.  Non-urgent messages can be sent to your provider as well.   To learn more about what you can do with MyChart, go to [email or web address].

## 2024-08-15 NOTE — Progress Notes (Signed)
" °  Cardiology Office Note   Date:  08/15/2024  ID:  Akela, Pocius 1981-04-13, MRN [id number] PCP: Dineen Rollene MATSU, FNP  [facility] Providers Cardiologist:  Caron Poser, MD   History of Present Illness Lauren [name] is a 44 y.o. female PMH HTN, morbid obesity status post gastric sleeve who presents for further evaluation management of palpitations and paroxysmal tachycardia.  Seen in the ED for this issue 08/13/2024.  Presented with sinus tachycardia.  Troponin, D-dimer, magnesium, CBC unremarkable.  BMP only remarkable for potassium of 3.2.  Treated with fluids and potassium - symptoms resolved.  Last LDL 78 01/2023.  Patient reports she is still having symptoms.  She also notes chest discomfort.  She denies any syncopal episodes.  She is in the middle of a very stressful move, so is wondering if this could just be anxiety.  She denies any significant family history of premature ASCVD.  Relevant CVD History -Monitor pending   ROS: Pt denies any arm pain, syncope, presyncope, orthopnea, PND, or LE edema.  Studies Reviewed I have independently reviewed the patient's ECG, previous medical records, previous blood work.  Physical Exam VS:  BP 120/86 (BP Location: Left Arm, Patient Position: Sitting, Cuff Size: Large)   Pulse 84   Ht 5' 2 (1.575 m)   Wt 218 lb (98.9 kg)   SpO2 98%   BMI 39.87 kg/m        Wt Readings from Last 3 Encounters:  08/15/24 218 lb (98.9 kg)  08/13/24 214 lb (97.1 kg)  04/03/24 223 lb 2 oz (101.2 kg)    GEN: No acute distress. NECK: No JVD; No carotid bruits. CARDIAC: RRR, no murmurs, rubs, gallops. RESPIRATORY:  Clear to auscultation. EXTREMITIES:  Warm and well-perfused. No edema.  ASSESSMENT AND PLAN Chest discomfort Paroxysmal tachycardia Palpitations Patient presents with undifferentiated chest pain which is described as pressure-like.  She also reports constant palpitations and paroxysms of tachycardia.  Further  investigation is indicated.  Plan: - Coronary CT angiogram to rule out obstructive CAD - Monitor to evaluate for arrhythmia - Echocardiogram to evaluate for structural causes  Hypokalemia 3.2 at recent ED visit.  Does have history of gastric sleeve.  Ongoing hypokalemia could be contributory.  Plan: - Recheck BMP; would continue potassium supplementation if she continues to be hypokalemic         Dispo: RTC 3 months or sooner as needed  Signed, Caron Poser, MD  "

## 2024-08-16 ENCOUNTER — Ambulatory Visit: Payer: Self-pay

## 2024-08-16 LAB — BASIC METABOLIC PANEL WITH GFR
BUN/Creatinine Ratio: 15 (ref 9–23)
BUN: 12 mg/dL (ref 6–24)
CO2: 20 mmol/L (ref 20–29)
Calcium: 9.2 mg/dL (ref 8.7–10.2)
Chloride: 105 mmol/L (ref 96–106)
Creatinine, Ser: 0.78 mg/dL (ref 0.57–1.00)
Glucose: 80 mg/dL (ref 70–99)
Potassium: 3.9 mmol/L (ref 3.5–5.2)
Sodium: 142 mmol/L (ref 134–144)
eGFR: 97 mL/min/1.73

## 2024-08-17 ENCOUNTER — Ambulatory Visit

## 2024-08-17 ENCOUNTER — Ambulatory Visit: Admitting: Nurse Practitioner

## 2024-08-17 DIAGNOSIS — Z79899 Other long term (current) drug therapy: Secondary | ICD-10-CM | POA: Diagnosis not present

## 2024-08-17 DIAGNOSIS — I479 Paroxysmal tachycardia, unspecified: Secondary | ICD-10-CM | POA: Diagnosis not present

## 2024-08-17 DIAGNOSIS — R002 Palpitations: Secondary | ICD-10-CM

## 2024-08-17 LAB — ECHOCARDIOGRAM COMPLETE
AR max vel: 2.28 cm2
AV Area VTI: 2.64 cm2
AV Area mean vel: 2.35 cm2
AV Mean grad: 2 mmHg
AV Peak grad: 3.8 mmHg
Ao pk vel: 0.97 m/s
Area-P 1/2: 3.12 cm2
S' Lateral: 2.7 cm

## 2024-08-23 ENCOUNTER — Ambulatory Visit: Admitting: Family

## 2024-08-24 ENCOUNTER — Encounter (HOSPITAL_COMMUNITY): Payer: Self-pay

## 2024-08-25 ENCOUNTER — Telehealth (HOSPITAL_COMMUNITY): Payer: Self-pay | Admitting: *Deleted

## 2024-08-25 NOTE — Telephone Encounter (Signed)
 Reaching out to patient to offer assistance regarding upcoming cardiac imaging study; pt verbalizes understanding of appt date/time, parking situation and where to check in, pre-test NPO status and medications ordered, and verified current allergies; name and call back number provided for further questions should they arise  Lauren Requena RN Navigator Cardiac Imaging [name] [phone] office [phone] cell  Patient to take 100mg  metoprolol  tartrate two hours prior to her cardia CT scan.

## 2024-08-28 ENCOUNTER — Ambulatory Visit

## 2024-08-29 ENCOUNTER — Encounter: Payer: Self-pay | Admitting: Obstetrics

## 2024-08-31 ENCOUNTER — Other Ambulatory Visit: Payer: Self-pay | Admitting: Family

## 2024-08-31 DIAGNOSIS — Z1231 Encounter for screening mammogram for malignant neoplasm of breast: Secondary | ICD-10-CM

## 2024-09-11 ENCOUNTER — Ambulatory Visit

## 2024-09-20 ENCOUNTER — Encounter

## 2024-10-02 ENCOUNTER — Ambulatory Visit: Admitting: Family

## 2024-11-14 ENCOUNTER — Ambulatory Visit
